# Patient Record
Sex: Male | Born: 1955
Health system: Southern US, Community
[De-identification: ages and names within clinical notes are randomized; demographics above are authoritative.]

## PROBLEM LIST (undated history)

## (undated) DIAGNOSIS — I2510 Atherosclerotic heart disease of native coronary artery without angina pectoris: Secondary | ICD-10-CM

## (undated) DIAGNOSIS — E7800 Pure hypercholesterolemia, unspecified: Secondary | ICD-10-CM

## (undated) DIAGNOSIS — I219 Acute myocardial infarction, unspecified: Secondary | ICD-10-CM

## (undated) DIAGNOSIS — E119 Type 2 diabetes mellitus without complications: Secondary | ICD-10-CM

## (undated) DIAGNOSIS — M1990 Unspecified osteoarthritis, unspecified site: Secondary | ICD-10-CM

## (undated) DIAGNOSIS — D649 Anemia, unspecified: Secondary | ICD-10-CM

## (undated) DIAGNOSIS — I10 Essential (primary) hypertension: Secondary | ICD-10-CM

## (undated) HISTORY — PX: CARDIAC CATHETERIZATION: SHX172

## (undated) HISTORY — PX: KNEE ARTHROSCOPY: SUR90

## (undated) HISTORY — PX: APPENDECTOMY: SHX54

## (undated) HISTORY — PX: TONSILLECTOMY: SUR1361

## (undated) HISTORY — PX: FRACTURE SURGERY: SHX138

## (undated) HISTORY — PX: COLONOSCOPY: SHX174

---

## 2003-08-06 ENCOUNTER — Ambulatory Visit (HOSPITAL_COMMUNITY): Admission: RE | Admit: 2003-08-06 | Discharge: 2003-08-06 | Payer: Self-pay | Admitting: Orthopaedic Surgery

## 2003-09-16 ENCOUNTER — Ambulatory Visit (HOSPITAL_COMMUNITY): Admission: RE | Admit: 2003-09-16 | Discharge: 2003-09-16 | Payer: Self-pay | Admitting: Orthopaedic Surgery

## 2006-11-18 ENCOUNTER — Ambulatory Visit (HOSPITAL_COMMUNITY): Admission: RE | Admit: 2006-11-18 | Discharge: 2006-11-18 | Payer: Self-pay | Admitting: Orthopaedic Surgery

## 2006-12-06 ENCOUNTER — Encounter (INDEPENDENT_AMBULATORY_CARE_PROVIDER_SITE_OTHER): Payer: Self-pay | Admitting: Specialist

## 2006-12-06 ENCOUNTER — Ambulatory Visit (HOSPITAL_COMMUNITY): Admission: RE | Admit: 2006-12-06 | Discharge: 2006-12-06 | Payer: Self-pay | Admitting: Orthopaedic Surgery

## 2007-02-09 ENCOUNTER — Encounter (HOSPITAL_COMMUNITY): Admission: RE | Admit: 2007-02-09 | Discharge: 2007-03-11 | Payer: Self-pay | Admitting: Orthopedic Surgery

## 2007-03-13 ENCOUNTER — Encounter (HOSPITAL_COMMUNITY): Admission: RE | Admit: 2007-03-13 | Discharge: 2007-04-12 | Payer: Self-pay | Admitting: Orthopedic Surgery

## 2007-04-13 ENCOUNTER — Encounter (HOSPITAL_COMMUNITY): Admission: RE | Admit: 2007-04-13 | Discharge: 2007-05-13 | Payer: Self-pay | Admitting: Orthopedic Surgery

## 2007-05-15 ENCOUNTER — Ambulatory Visit (HOSPITAL_COMMUNITY): Admission: RE | Admit: 2007-05-15 | Discharge: 2007-05-15 | Payer: Self-pay | Admitting: Pulmonary Disease

## 2007-05-16 ENCOUNTER — Encounter (HOSPITAL_COMMUNITY): Admission: RE | Admit: 2007-05-16 | Discharge: 2007-06-15 | Payer: Self-pay | Admitting: Orthopedic Surgery

## 2007-08-16 ENCOUNTER — Ambulatory Visit (HOSPITAL_COMMUNITY): Admission: RE | Admit: 2007-08-16 | Discharge: 2007-08-16 | Payer: Self-pay | Admitting: Internal Medicine

## 2007-08-16 ENCOUNTER — Encounter: Payer: Self-pay | Admitting: Internal Medicine

## 2007-08-16 ENCOUNTER — Ambulatory Visit: Payer: Self-pay | Admitting: Internal Medicine

## 2007-09-28 DIAGNOSIS — I219 Acute myocardial infarction, unspecified: Secondary | ICD-10-CM

## 2007-09-28 HISTORY — PX: CORONARY STENT PLACEMENT: SHX1402

## 2007-09-28 HISTORY — DX: Acute myocardial infarction, unspecified: I21.9

## 2008-07-19 ENCOUNTER — Encounter: Payer: Self-pay | Admitting: Emergency Medicine

## 2008-07-19 ENCOUNTER — Inpatient Hospital Stay (HOSPITAL_COMMUNITY): Admission: EM | Admit: 2008-07-19 | Discharge: 2008-07-23 | Payer: Self-pay | Admitting: Cardiovascular Disease

## 2008-10-07 ENCOUNTER — Encounter (HOSPITAL_COMMUNITY): Admission: RE | Admit: 2008-10-07 | Discharge: 2008-11-06 | Payer: Self-pay | Admitting: Cardiovascular Disease

## 2008-11-08 ENCOUNTER — Encounter (HOSPITAL_COMMUNITY): Admission: RE | Admit: 2008-11-08 | Discharge: 2008-12-08 | Payer: Self-pay | Admitting: Cardiovascular Disease

## 2008-12-09 ENCOUNTER — Encounter (HOSPITAL_COMMUNITY): Admission: RE | Admit: 2008-12-09 | Discharge: 2009-01-08 | Payer: Self-pay | Admitting: Cardiovascular Disease

## 2009-03-24 ENCOUNTER — Emergency Department (HOSPITAL_COMMUNITY): Admission: EM | Admit: 2009-03-24 | Discharge: 2009-03-24 | Payer: Self-pay | Admitting: Emergency Medicine

## 2009-03-25 ENCOUNTER — Ambulatory Visit (HOSPITAL_COMMUNITY): Admission: RE | Admit: 2009-03-25 | Discharge: 2009-03-25 | Payer: Self-pay | Admitting: Pulmonary Disease

## 2010-07-30 ENCOUNTER — Ambulatory Visit (HOSPITAL_COMMUNITY): Admission: RE | Admit: 2010-07-30 | Discharge: 2010-07-30 | Payer: Self-pay | Admitting: Pulmonary Disease

## 2010-12-23 ENCOUNTER — Other Ambulatory Visit (HOSPITAL_COMMUNITY): Payer: Self-pay | Admitting: Orthopaedic Surgery

## 2010-12-23 DIAGNOSIS — M25569 Pain in unspecified knee: Secondary | ICD-10-CM

## 2010-12-24 ENCOUNTER — Ambulatory Visit (HOSPITAL_COMMUNITY)
Admission: RE | Admit: 2010-12-24 | Discharge: 2010-12-24 | Disposition: A | Discharge status: Home / Self Care | Payer: Federal, State, Local not specified - PPO | Source: Ambulatory Visit | Attending: Orthopaedic Surgery | Admitting: Orthopaedic Surgery

## 2010-12-24 ENCOUNTER — Other Ambulatory Visit (HOSPITAL_COMMUNITY): Payer: Self-pay | Admitting: Orthopaedic Surgery

## 2010-12-24 DIAGNOSIS — M25569 Pain in unspecified knee: Secondary | ICD-10-CM | POA: Insufficient documentation

## 2010-12-24 DIAGNOSIS — M25469 Effusion, unspecified knee: Secondary | ICD-10-CM | POA: Insufficient documentation

## 2010-12-24 DIAGNOSIS — M2340 Loose body in knee, unspecified knee: Secondary | ICD-10-CM | POA: Insufficient documentation

## 2010-12-24 DIAGNOSIS — S83259A Bucket-handle tear of lateral meniscus, current injury, unspecified knee, initial encounter: Secondary | ICD-10-CM | POA: Insufficient documentation

## 2011-01-04 LAB — DIFFERENTIAL
Basophils Absolute: 0 10*3/uL (ref 0.0–0.1)
Basophils Relative: 0 % (ref 0–1)
Eosinophils Absolute: 0 10*3/uL (ref 0.0–0.7)
Eosinophils Relative: 0 % (ref 0–5)
Lymphocytes Relative: 6 % — ABNORMAL LOW (ref 12–46)
Lymphs Abs: 0.7 10*3/uL (ref 0.7–4.0)
Monocytes Absolute: 0.8 10*3/uL (ref 0.1–1.0)
Monocytes Relative: 7 % (ref 3–12)
Neutro Abs: 10.3 10*3/uL — ABNORMAL HIGH (ref 1.7–7.7)
Neutrophils Relative %: 87 % — ABNORMAL HIGH (ref 43–77)

## 2011-01-04 LAB — BASIC METABOLIC PANEL
BUN: 25 mg/dL — ABNORMAL HIGH (ref 6–23)
CO2: 27 mEq/L (ref 19–32)
Calcium: 9.2 mg/dL (ref 8.4–10.5)
Chloride: 106 mEq/L (ref 96–112)
Creatinine, Ser: 1.4 mg/dL (ref 0.4–1.5)
GFR calc Af Amer: >60 mL/min (ref >60)
GFR calc non Af Amer: 53 mL/min — ABNORMAL LOW (ref >60)
Glucose, Bld: 177 mg/dL — ABNORMAL HIGH (ref 70–99)
Potassium: 4 mEq/L (ref 3.5–5.1)
Sodium: 138 mEq/L (ref 135–145)

## 2011-01-04 LAB — CBC
HCT: 36.3 % — ABNORMAL LOW (ref 39.0–52.0)
Hemoglobin: 12.9 g/dL — ABNORMAL LOW (ref 13.0–17.0)
MCHC: 35.6 g/dL (ref 30.0–36.0)
MCV: 89.5 fL (ref 78.0–100.0)
Platelets: 174 10*3/uL (ref 150–400)
RBC: 4.06 MIL/uL — ABNORMAL LOW (ref 4.22–5.81)
RDW: 13.7 % (ref 11.5–15.5)
WBC: 11.8 10*3/uL — ABNORMAL HIGH (ref 4.0–10.5)

## 2011-01-04 LAB — GLUCOSE, CAPILLARY: Glucose-Capillary: 151 mg/dL — ABNORMAL HIGH (ref 70–99)

## 2011-02-09 NOTE — H&P (Signed)
NAMEDEAUNTE, DENTE NO.:  [id number]   MEDICAL RECORD NO.:  [id number]          PATIENT TYPE:  INP   LOCATION:  2909                         FACILITY:  [facility]   PHYSICIAN:  Nanetta Batty, M.D.   DATE OF BIRTH:  1956/07/26   DATE OF ADMISSION:  07/19/2008  DATE OF DISCHARGE:                              HISTORY & PHYSICAL   CHIEF COMPLAINT:  Chest pain.   HISTORY OF PRESENT ILLNESS:  Mr. Klimowicz is a 55 year old male with no  prior history of coronary disease who is an insulin-dependent diabetic  followed by Dr. Juanetta Gosling.  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  This only lasted about 15  minutes.  He went inside and laid down and took a nap, but when he woke  up, it recurred.  He mentioned it to his wife, and he went to [facility]  ER.  His symptoms became worse in the emergency room, and his EKG  suggested acute circumflex MI.  He was treated with heparin, aspirin,  and transported to [facility] for urgent catheterization.  The patient  describes his pain as a midsternal pressure.  He did have some radiation  to his left arm.  By the time he came to the emergency room at [facility], he did have some associated nausea and vomiting.  The patient's  past medical history is remarkable for prior left knee ACL replacement a  year and a half ago.  He has had an appendectomy and tonsillectomy.  He  has insulin-dependent diabetes.   His current medications are:  1. Humalog 75/25; he takes 110 units in the morning and a variable      dose in the evening.  2. He takes Tricor daily.  3. He also takes an anti-inflammatory.   He has no known drug allergies.   SOCIAL HISTORY:  He is married.  He is currently out of work from [employer]  in [place].  He is a nonsmoker.   FAMILY HISTORY:  Remarkable that his mother has coronary disease; she  still living.   REVIEW OF SYSTEMS:  Essentially unremarkable except for above.  He  denies  any GI bleeding or melena.  He has not had previous chest pain or  unusual dyspnea.   PHYSICAL EXAMINATION:  Blood pressure 128/76, pulse 54, respirations 12.  GENERAL:  He is well-developed, overweight male in no acute distress.  HEENT:  Normocephalic.  Extraocular movements are intact.  Sclerae  nonicteric.  He wears glasses.  NECK:  Without JVD or bruit.  CHEST:  Clear to auscultation and percussion.  CARDIAC EXAM:  Reveals regular rate and rhythm without murmur or gallop.  Normal S1-S2.  ABDOMEN:  Is obese, nontender.  No hepatosplenomegaly.  EXTREMITIES:  Without edema.  Distal pulses are 3+/4 bilaterally.  NEUROLOGIC EXAM:  Grossly intact.  He is awake, alert, oriented, and  cooperative.  Moves all extremities without obvious deficit.  SKIN:  Warm and dry   IMPRESSION:  1. Acute myocardial infarction.  2. Insulin-dependent diabetes.  3. Dyslipidemia.  4. Obesity.   PLAN:  The patient was taken to the catheterization lab urgently by Dr.  Allyson Sabal for catheterization.      Abelino Derrick, P.A.      Nanetta Batty, M.D.  Electronically Signed    LKK/MEDQ  D:  07/19/2008  T:  07/20/2008  Job:  [id number]

## 2011-02-09 NOTE — Cardiovascular Report (Signed)
NAMEISA, Raymond [name] NO.:  [id number]   MEDICAL RECORD NO.:  [id number]          PATIENT TYPE:  INP   LOCATION:  2909                         FACILITY:  [facility]   PHYSICIAN:  Raymond [name], M.D.   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  Took four  baby aspirin at home and was driven to [facility] ER where he was found  to have inferior ST-segment elevation.  He was treated with  nitroglycerin, IV heparin, and transferred by [facility] to [facility] for  urgent intervention.  His risk factors include family history and 61-  year history of insulin-dependent diabetes.   DESCRIPTION OF PROCEDURE:  The patient was brought to the second floor  [facility] in the postabsorptive state.  He was not  premedicated.  His right groin was prepped and shaved in the usual  sterile fashion.  Xylocaine 1% was used for local anesthesia.  A 6-  French sheath was inserted into the right femoral artery using standard  Seldinger technique.  A 6-French right and left Judkins diagnostic  catheter as well as a 6-French pigtail catheter were used for selective  coronary angiography, left ventriculography, subselective left internal  mammary artery angiography, and distal abdominal aortography.  Visipaque  dye was used for the entirety of the case.  Retrograde aorta, left  ventricular, and pullback pressures were recorded.   HEMODYNAMIC RESULTS:  1. Aortic systolic pressure 144 and diastolic pressure 74.  2. Left ventricular systolic pressure 141 and diastolic pressure 25.   SELECTIVE CORONARY ANGIOGRAPHY:  1. Left main normal.  2. LAD; LAD was calcified in its midportion with 60% segmental  mid      stenosis as well as 60% proximal segmental stenosis of the diagonal      branch arising from the diseased LAD segment.  3. Ramus branch; this is a large bifurcating vessel that was free of      significant disease.  4. Left circumflex; this was a nondominant vessel with a 99% AV groove      stenosis that was the culprit lesion.  5. Right coronary artery; this is a large dominant vessel with an 80%      stenosis at the crux of the vessel just before the bifurcation of      PDA and PLA.  6. Left ventriculography; RAO and LAO left ventriculograms were      performed using 25 mL of Visipaque dye at 12 mL per second.      Overall LVEF was estimated greater than 60% without focal wall      motion abnormalities.  Left ventriculography was performed in the      RAO and LAO views.  7. Left internal mammary artery; this vessel is subselectively  visualized and was widely patent.  Suitable for use during coronary      artery bypass grafting.  8. Distal abdominal aortography; distal abdominal aortogram was      performed using 20 mL of Visipaque dye at 20 mL per second.  The      renal arteries were widely patent.  Infrarenal abdominal aorta and      iliac bifurcation were free of significant atherosclerotic changes.   IMPRESSION:  Raymond [name] has an aborted inferior wall myocardial  infarction.  This is a result of high-grade lesion in the mid  atrioventricular groove circ.  The distal PLA from the circumflex  courses down the inferior wall.  We will plan to perform percutaneous  coronary intervention and stenting using Angiomax and drug-eluting stent  of the circumflex with staged intervention of the right coronary disease  and probably medical treatment of the LAD diagonal branch bifurcation.   The patient received four baby aspirin at home, 600 mg of p.o. Plavix,  20 mg of IV Pepcid, and Angiomax bolus with an AST of 391.  Visipaque  dye was used for the entirety of the case.   Retrograde aortic pressures  monitored during the case.  A 200 mcg of intracoronary nitroglycerin was  administered during the case.   Using a 6-French XB 3.5 guide catheter along with 0.014 x 190 Asahi soft  wire and 2.5 x 12 mm Sprinter balloon PTCA was performed.  Following  this a 2.75 x 15 Promus stent was then deployed at 14 and 16 atmospheres  and postdilated with a 3.0 x 12 [place] Voyager at 16 atmospheres (3.7-mm  resulting in reduction of 99% mid AV groove circumflex 0% residual TIMI  III flow).  The patient tolerated the procedure well.  There were no  hemodynamic or electrocardiographic sequelae.   OVERALL IMPRESSION:  Successful percutaneous coronary intervention of  stenting of mid atrioventricular groove circumflex, which was patent at  the time of intervention with TIMI III flow and the patient was  asymptomatic with an excellent result.  Plans will be to discontinue the  Angiomax and perform intervention in distal right in several days with  medical treatment of the left anterior descending diagonal branch and  outpatient Myoview stress testing to determine physiologic significance.  The patient will be treated with aspirin, Plavix, beta-blocker, statin  drug, and ACE inhibitor.  He left the laboratory in stable condition.      Raymond [name], M.D.  Electronically Signed     JB/MEDQ  D:  07/19/2008  T:  07/20/2008  Job:  [id number]   cc:   [facility]  [facility]  Ramon Dredge L. Juanetta Gosling, M.D.

## 2011-02-09 NOTE — Cardiovascular Report (Signed)
Raymond [name], POUND NO.:  [id number]   MEDICAL RECORD NO.:  [id number]          PATIENT TYPE:  INP   LOCATION:  6524                         FACILITY:  [facility]   PHYSICIAN:  Nanetta Batty, M.D.   DATE OF BIRTH:  1956/06/21   DATE OF PROCEDURE:  DATE OF DISCHARGE:                            CARDIAC CATHETERIZATION   Mr. Solis presents today for PCI and stenting of his distal right  coronary artery.  He presented Friday night with an inferior MI related  to high-grade mid AV groove circumflex lesion, which was stented with a  Promus drug-eluting stent.  He had moderate LAD to diagonal branch  bifurcation disease, which was calcified as well as distal right  disease.  He did well over the weekend and presents now for stage  elective RCA intervention.   PROCEDURE DESCRIPTION:  The patient was brought to the Second Floor  [facility] in the postabsorptive state.  He was  premedicated with p.o. Valium, IV Versed, and fentanyl.  His right groin  was prepped and shaved in the usual sterile fashion.  A 1% Xylocaine was  used for local anesthesia.  A 7-French sheath was inserted into the  right femoral artery using standard Seldinger technique.  A 7-French JR4  guide catheter was used along with  4190 Asahi soft wire and a 2.5 x 10  angioscope atherectomy device.  Visipaque dye was used through the  entirety of the case.  Retrograde aortic pressure was monitored during  the case.  The patient was on aspirin and Plavix and received Angiomax  bolus with an ACT of 276.   The JR4 guide engaged the ostium of the right coronary artery without  difficulty.  Asahi soft wire crossed the lesion and was placed across  the PDA into the distal PLA branch.  Angioscope atherectomy was  performed of the distal RCA at the crux and nominal pressures with an  excellent graft result.  Stenting was performed with a 3.0 x 15 Promus  drug-eluting stent across the ostium of the  PDA into the proximal PLA.  There was snow-ploughing and impingement on the ostium of the PDA.  The PDA was then wired through the strut of the Promus stent and dilated  with a 2.5 x 10 Apex balloon.  A 2.25 x 8 Taxus Adam drug-eluting stent  was then placed at the ostium of the PDA through the Promus stent strut  and deployed at 14 atmospheres.  This was postdilated with a 2.5 x 8 [place]  Voyager at 16 atmospheres resulting in reduction of 90% ostial PDA  lesion to 0% residual.  The patient tolerated the procedure well.  There  were no hemodynamic or electrocardiographic sequelae.  There was TIMI 3  flow at the end of the case.   IMPRESSION:  Successful percutaneous coronary intervention stenting of  the distal right coronary artery and ostium of the posterior descending  artery with a Promus drug-eluting stent and a Taxus Adam drug-eluting  stent.  The patient tolerated the procedure well.  The guidewire and  catheter were removed.  The sheath was then secured in  place.  The patient left the lab in stable condition.  He will be gently  hydrated overnight, treated with aspirin and Plavix, and discharged home  in the morning.  We will treat his LAD diagonal branches disease  medically and get an outpatient Myoview to determine physiologic  significance.      Nanetta Batty, M.D.  Electronically Signed     JB/MEDQ  D:  07/22/2008  T:  07/23/2008  Job:  [id number]   cc:   Second [facility]  [facility]

## 2011-02-09 NOTE — Op Note (Signed)
NAME:  [name], Raymond                  ACCOUNT NO.:  [id number]   MEDICAL RECORD NO.:  [id number]          PATIENT TYPE:  AMB   LOCATION:  DAY                           FACILITY:  [facility]   PHYSICIAN:  R. Roetta Sessions, M.D. DATE OF BIRTH:  [birth year]   DATE OF PROCEDURE:  08/16/2007  DATE OF DISCHARGE:                               OPERATIVE REPORT   PROCEDURE:  Colonoscopy with snare polypectomy and biopsy.   INDICATIONS FOR PROCEDURE:  A 55 year old gentleman devoid of any lower  GI tract symptoms, sent over courtesy of Dr. Shaune Pollack for colorectal  cancer screening. He has never had his lower GI tract imaged, no family  history colon cancer.  Risks, benefits and alternatives have been  reviewed, questions were answered.  He is agreeable.   PROCEDURE NOTE:  O2 saturation, blood pressure, pulse and respirations  were monitored throughout the entire procedure.   CONSCIOUS SEDATION:  Versed 4 mg IV, Demerol 100 grams IV in divided  doses.   INSTRUMENT:  Pentax video chip system.   FINDINGS:  Digital rectal exam revealed no obvious abnormalities.   ENDOSCOPIC FINDINGS:  Prep was good.  Colon:  Colonic mucosa was surveyed from rectosigmoid junction through  the left, transverse, right colon to the area of appendiceal orifice,  ileocecal valve and cecum.  These structures were well seen and  photographed for the record.  From this level scope was slowly and  cautiously withdrawn.  All previously mentioned mucosal surfaces were  again seen.  The colonic mucosa appeared entirely normal down to the  rectosigmoid  In the rectosigmoid there was an angry 7 mm polyp on broad  stalk.  The remainder of the colonic mucosa appeared normal.  This polyp  was resected with hot snare, recovered through the scope.  The scope was  pulled down in the rectum.  Thorough examination of rectal mucosa  including retroflex view of anal verge demonstrated a pale oval lesion  coming out of the anal canal seen  best retroflexed.  This had somewhat  of the appearance of an anal papilla but it appeared to be slightly  larger and more round than is typically seen.  The surface of it looked  more like a hyperplastic polyp.  Please see multiple photographs taken.  This was cold biopsied multiple times.  Some good pieces were obtained  for the pathologist.  The patient tolerated the procedure well, was  reacted in endoscopy.   IMPRESSION:  Pale oval lesion just inside the anal verge perhaps  emanating from the anal canal as described above, seen retroflexed, not  well appreciated on digital rectal exam, suspicious for unusual anal  papilla versus hyperplastic polyp versus other process, biopsied.  Otherwise normal rectum.  At rectosigmoid, 7-mm angry polyp removed with  hot snare.  Remainder colonic mucosa appeared normal.   RECOMMENDATIONS:  1. Follow-up on path.  2. Further recommendations to follow.      Jonathon Bellows, M.D.  Electronically Signed     RMR/MEDQ  D:  08/16/2007  T:  08/16/2007  Job:  [id number]  cc:   Ramon Dredge L. Juanetta Gosling, M.D.  Fax: [phone]

## 2011-02-09 NOTE — Discharge Summary (Signed)
Raymond [name], Raymond [name] NO.:  [id number]   MEDICAL RECORD NO.:  [id number]          PATIENT TYPE:  INP   LOCATION:  6524                         FACILITY:  [facility]   PHYSICIAN:  Nanetta Batty, M.D.   DATE OF BIRTH:  May 21, 1956   DATE OF ADMISSION:  07/19/2008  DATE OF DISCHARGE:  07/23/2008                               DISCHARGE SUMMARY   DISCHARGE DIAGNOSES:  1. The circumflex myocardial infarction treated with urgent      intervention and placement of a Promus stent on July 19, 2008,      with staged intervention to the right coronary artery and posterior      descending artery with a Promus and a Taxus stent on July 22, 2008.  2. Insulin-dependent diabetes.  3. Dyslipidemia.   HOSPITAL COURSE:  Raymond [name] is a 55 year old male with no prior history  of coronary artery disease.  He is an insulin-dependent diabetic  followed by Dr. Juanetta [name].  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  He went inside  and laid down.  His symptoms initially abated, but then recurred.  He  presented to [facility] ER.  His EKG there suggested acute circumflex MI  and he was transferred as a ST-elevation MI and transferred urgently to  [facility] after receiving aspirin and heparin and nitroglycerin.  The patient  was taken immediately to the Cath Lab by Dr. Allyson Sabal.  Left main was  normal.  He did have a 50-60% LAD lesion.  Circumflex had a 95% mid  stenosis.  There was a large OM that was free of disease.  The RCA had a  30% proximal narrowing and an 80% distal narrowing.  The iliac arteries  and renal arteries were normal.  LV function was normal.  The patient  underwent placement of a Promus stent to the infarct vessel.  He was  admitted to the CCU.  Serial enzymes were obtained.  His CKs peaked at  357 with 12 MBs.  He was taken back to the Cath Lab on July 22, 2008,  for elective intervention of the RCA.  He had  an RCA Taxus stent with a  Promus stent placed to the PDA.  Plan is for medical therapy of residual  60% LAD narrowing.  We feel he can be discharged on July 23, 2008.   DISCHARGE MEDICATIONS:  Novolin insulin 75/25, he takes 110 units in the  morning and 90 units in the evening and we have added ramipril 5 mg a  day, simvastatin 40 mg a day, Plavix 75 mg a day, coated aspirin 325 mg  a day, Pepcid AC 20 mg a day, and nitroglycerin sublingual p.r.n.   LABORATORY DATA:  CKs peaked as noted above 357 and 12 MBs.  Lipid panel  shows cholesterol 155, triglycerides 199, HDL was 19, and LDL was 95.  White count is 8.1, hemoglobin 14, hematocrit 39.5, and platelets 178.  Sodium 138, potassium 3.9, BUN 18, and creatinine 1.16.  Hemoglobin A1c  was 6.8.  TSH is 1.76.  Portable chest, there was cardiac enlargement  with pulmonary venous congestion.   DISPOSITION:  The patient is discharged in stable condition.  He will  follow up with Dr. Allyson Sabal in [place].  He will need followup LFTs.  We  may consider adding niacin at some point as an outpatient.      Raymond [name], P.A.      Nanetta Batty, M.D.  Electronically Signed    LKK/MEDQ  D:  07/23/2008  T:  07/23/2008  Job:  [id number]   cc:   Nanetta Batty, M.D.  Raymond [name], M.D.

## 2011-02-12 NOTE — Op Note (Signed)
NAME:  Raymond [name], Raymond [name]                  ACCOUNT NO.:  [id number]   MEDICAL RECORD NO.:  [id number]          PATIENT TYPE:  AMB   LOCATION:  DAY                           FACILITY:  [facility]   PHYSICIAN:  J. Darreld Mclean, M.D. DATE OF BIRTH:  10/23/1955   DATE OF PROCEDURE:  12/06/2006  DATE OF DISCHARGE:                               OPERATIVE REPORT   PREOPERATIVE DIAGNOSIS:  Tear left knee medial and lateral meniscus, old  anterior cruciate ligament tear, posterior medial meniscus plus old  anterior cruciate ligament tear of the left knee.   PROCEDURE:  Operative arthroscopy of the left knee, debridement of ACL  tear, partial medial meniscectomy on the left.   SURGEON:  J. Darreld Mclean, M.D.   ANESTHESIA:  General.   TOURNIQUET TIME:  Twenty-three minutes.   DRAINS:  None.   INDICATIONS FOR PROCEDURE:  The patient fell and injured his left knee.  MRI shows a tear of the left knee medial and lateral meniscus and the  radiologist read an acute tear of ACL, but by his history and by  previous problems he has an old ACL tear of the left knee.  He has not  gotten improvement with conservative treatment.  Surgery is recommended.  The risks and imponderable of the procedure were discussed  preoperatively.  The patient appeared to understand and agreed to the  procedure as outlined.   DESCRIPTION OF PROCEDURE:  The patient was seen in the holding area.  The left knee was identified as the correct surgical site.  Placed a  mark on the left knee, placed a mark on the left knee.  He was brought  back to the operating room and given general anesthesia while supine.  Tourniquet __________ placed deflated, left knee.  A time-out was held  and identified Raymond [name] as the patient.  The left knee was the correct  surgical site.  He was prepped and draped in the usual manner.  The leg  was elevated, wrapped circumferentially with an Esmarch bandage and  tourniquet inflated to 300 mmHg.  Esmarch  bandage removed.   A medial incision was made and lactated ringers were instilled in the  knee by an infusion pump.  Laterally an incision was made and the  arthroscope was inserted.  Appropriate pictures were taken throughout  the procedure.  The suprapatellar pouch had degenerative changes and  grade II-III changes of the femoral patella condyle.  The medial side  was examined.  There was a small tear in the posterior horn of the  medial meniscus and this was removed with a meniscal shaver.  A good  smooth contour was obtained.  He had an ACL tear which appeared to be  old and chronic.  This area was debrided with a meniscal shaver.  Grade  II-III changes on the tibial plateau and the femoral condyle.  The  femoral condyle was irregular.   Laterally although it was difficult to see the meniscus was intact and  there was no apparent tear or any grade II changes laterally.  He was  systematically  re-examined and no pathology found.  The wounds were  irrigated with __________ part lactated ringers.  The wounds were  reapproximated using 3-0 nylon in interrupted vertical mattress manner.  Marcaine 0.25% instilled in each portal.  Tourniquet deflated for 23  minutes.  The patient tolerated the procedure well.  He was given a  prescription for Vicodin ES for pain.  I will see him in the office in  approximately 10 days to two weeks.  Physical therapy has been set up.  If any difficulties call me.  Numbers have been provided.           ______________________________  Shela Commons. Darreld Mclean, M.D.     JWK/MEDQ  D:  12/06/2006  T:  12/06/2006  Job:  [id number]
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
## 2011-06-28 LAB — DIFFERENTIAL
Basophils Absolute: 0
Basophils Relative: 0
Eosinophils Absolute: 0.1
Eosinophils Relative: 2
Lymphocytes Relative: 33
Lymphs Abs: 2.3
Monocytes Absolute: 0.8
Monocytes Relative: 11
Neutro Abs: 3.8
Neutrophils Relative %: 54

## 2011-06-28 LAB — BASIC METABOLIC PANEL
BUN: 26 — ABNORMAL HIGH
CO2: 25
Calcium: 9.1
Chloride: 108
Creatinine, Ser: 1.37
GFR calc Af Amer: >60
GFR calc non Af Amer: 54 — ABNORMAL LOW
Glucose, Bld: 135 — ABNORMAL HIGH
Potassium: 4.2
Sodium: 139

## 2011-06-28 LAB — CBC
HCT: 38.4 — ABNORMAL LOW
Hemoglobin: 13.2
MCHC: 34.4
MCV: 88.5
Platelets: 171
RBC: 4.34
RDW: 13.1
WBC: 7

## 2011-06-28 LAB — POCT CARDIAC MARKERS
CKMB, poc: 6.3
Myoglobin, poc: 316
Troponin i, poc: <0.05

## 2011-06-29 LAB — BASIC METABOLIC PANEL
BUN: 19
BUN: 21
CO2: 25
Calcium: 8.7
Calcium: 8.8
Chloride: 106
Creatinine, Ser: 1.11
GFR calc Af Amer: >60
GFR calc Af Amer: >60
GFR calc Af Amer: >60
GFR calc non Af Amer: >60
GFR calc non Af Amer: >60
GFR calc non Af Amer: >60
Glucose, Bld: 95
Potassium: 3.9
Potassium: 3.9
Sodium: 138
Sodium: 140

## 2011-06-29 LAB — COMPREHENSIVE METABOLIC PANEL
ALT: 27
AST: 32
Albumin: 3.5
Alkaline Phosphatase: 72
BUN: 22
CO2: 23
Calcium: 8.4
Chloride: 107
Creatinine, Ser: 1.09
GFR calc Af Amer: >60
GFR calc non Af Amer: >60
Glucose, Bld: 107 — ABNORMAL HIGH
Potassium: 4.2
Sodium: 137
Total Bilirubin: 0.7
Total Protein: 5.5 — ABNORMAL LOW

## 2011-06-29 LAB — CK TOTAL AND CKMB (NOT AT ARMC)
CK, MB: 11.6 — ABNORMAL HIGH
Relative Index: 2.7 — ABNORMAL HIGH
Relative Index: 3.4 — ABNORMAL HIGH
Total CK: 357 — ABNORMAL HIGH
Total CK: 424 — ABNORMAL HIGH

## 2011-06-29 LAB — CBC
HCT: 34.6 — ABNORMAL LOW
HCT: 36.1 — ABNORMAL LOW
Hemoglobin: 12.4 — ABNORMAL LOW
Hemoglobin: 13.2
MCHC: 34.3
MCHC: 35.3
MCV: 89
MCV: 89.3
Platelets: 150
Platelets: 162
Platelets: 170
Platelets: 178
RBC: 4.05 — ABNORMAL LOW
RBC: 4.32
RBC: 4.53
RDW: 12.9
RDW: 13.1
RDW: 13.1
RDW: 13.2
WBC: 6.5
WBC: 8.1

## 2011-06-29 LAB — GLUCOSE, CAPILLARY
Glucose-Capillary: 160 — ABNORMAL HIGH
Glucose-Capillary: 224 — ABNORMAL HIGH
Glucose-Capillary: 227 — ABNORMAL HIGH
Glucose-Capillary: 229 — ABNORMAL HIGH
Glucose-Capillary: 251 — ABNORMAL HIGH
Glucose-Capillary: 257 — ABNORMAL HIGH

## 2011-06-29 LAB — HEMOGLOBIN A1C
Hgb A1c MFr Bld: 6.8 — ABNORMAL HIGH
Mean Plasma Glucose: 148

## 2011-06-29 LAB — DIFFERENTIAL
Basophils Absolute: 0
Basophils Absolute: 0
Basophils Relative: 0
Eosinophils Absolute: 0.1
Eosinophils Relative: 1
Lymphocytes Relative: 23
Lymphocytes Relative: 26
Lymphs Abs: 1.5
Monocytes Absolute: 0.5
Monocytes Absolute: 0.6
Monocytes Relative: 8
Monocytes Relative: 9
Neutro Abs: 4.1
Neutro Abs: 4.5
Neutrophils Relative %: 69

## 2011-06-29 LAB — LIPID PANEL
LDL Cholesterol: 96
VLDL: 40

## 2011-06-29 LAB — PROTIME-INR
INR: 1.3
Prothrombin Time: 16.3 — ABNORMAL HIGH

## 2011-06-29 LAB — CARDIAC PANEL(CRET KIN+CKTOT+MB+TROPI)
CK, MB: 2.8
Relative Index: 2.6 — ABNORMAL HIGH
Total CK: 106
Troponin I: 0.4 — ABNORMAL HIGH

## 2011-06-29 LAB — APTT: aPTT: 46 — ABNORMAL HIGH

## 2011-06-29 LAB — TROPONIN I: Troponin I: 2

## 2011-10-15 DIAGNOSIS — E789 Disorder of lipoprotein metabolism, unspecified: Secondary | ICD-10-CM | POA: Diagnosis not present

## 2011-10-18 DIAGNOSIS — E119 Type 2 diabetes mellitus without complications: Secondary | ICD-10-CM | POA: Diagnosis not present

## 2011-10-18 DIAGNOSIS — E789 Disorder of lipoprotein metabolism, unspecified: Secondary | ICD-10-CM | POA: Diagnosis not present

## 2011-11-17 DIAGNOSIS — M1710 Unilateral primary osteoarthritis, unspecified knee: Secondary | ICD-10-CM | POA: Diagnosis not present

## 2011-12-16 DIAGNOSIS — E119 Type 2 diabetes mellitus without complications: Secondary | ICD-10-CM | POA: Diagnosis not present

## 2011-12-16 DIAGNOSIS — E789 Disorder of lipoprotein metabolism, unspecified: Secondary | ICD-10-CM | POA: Diagnosis not present

## 2011-12-27 DIAGNOSIS — E119 Type 2 diabetes mellitus without complications: Secondary | ICD-10-CM | POA: Diagnosis not present

## 2011-12-27 DIAGNOSIS — I10 Essential (primary) hypertension: Secondary | ICD-10-CM | POA: Diagnosis not present

## 2011-12-27 DIAGNOSIS — E789 Disorder of lipoprotein metabolism, unspecified: Secondary | ICD-10-CM | POA: Diagnosis not present

## 2012-02-02 DIAGNOSIS — E782 Mixed hyperlipidemia: Secondary | ICD-10-CM | POA: Diagnosis not present

## 2012-02-02 DIAGNOSIS — I10 Essential (primary) hypertension: Secondary | ICD-10-CM | POA: Diagnosis not present

## 2012-02-02 DIAGNOSIS — I2510 Atherosclerotic heart disease of native coronary artery without angina pectoris: Secondary | ICD-10-CM | POA: Diagnosis not present

## 2012-02-02 DIAGNOSIS — E119 Type 2 diabetes mellitus without complications: Secondary | ICD-10-CM | POA: Diagnosis not present

## 2012-02-09 DIAGNOSIS — M1710 Unilateral primary osteoarthritis, unspecified knee: Secondary | ICD-10-CM | POA: Diagnosis not present

## 2012-03-23 DIAGNOSIS — E1039 Type 1 diabetes mellitus with other diabetic ophthalmic complication: Secondary | ICD-10-CM | POA: Diagnosis not present

## 2012-03-27 DIAGNOSIS — E119 Type 2 diabetes mellitus without complications: Secondary | ICD-10-CM | POA: Diagnosis not present

## 2012-03-27 DIAGNOSIS — E789 Disorder of lipoprotein metabolism, unspecified: Secondary | ICD-10-CM | POA: Diagnosis not present

## 2012-04-03 DIAGNOSIS — E789 Disorder of lipoprotein metabolism, unspecified: Secondary | ICD-10-CM | POA: Diagnosis not present

## 2012-04-03 DIAGNOSIS — E119 Type 2 diabetes mellitus without complications: Secondary | ICD-10-CM | POA: Diagnosis not present

## 2012-04-03 DIAGNOSIS — I10 Essential (primary) hypertension: Secondary | ICD-10-CM | POA: Diagnosis not present

## 2012-06-12 DIAGNOSIS — E785 Hyperlipidemia, unspecified: Secondary | ICD-10-CM | POA: Diagnosis not present

## 2012-06-12 DIAGNOSIS — F411 Generalized anxiety disorder: Secondary | ICD-10-CM | POA: Diagnosis not present

## 2012-06-12 DIAGNOSIS — I259 Chronic ischemic heart disease, unspecified: Secondary | ICD-10-CM | POA: Diagnosis not present

## 2012-06-12 DIAGNOSIS — I10 Essential (primary) hypertension: Secondary | ICD-10-CM | POA: Diagnosis not present

## 2012-06-12 DIAGNOSIS — F329 Major depressive disorder, single episode, unspecified: Secondary | ICD-10-CM | POA: Diagnosis not present

## 2012-06-12 DIAGNOSIS — E109 Type 1 diabetes mellitus without complications: Secondary | ICD-10-CM | POA: Diagnosis not present

## 2012-07-04 ENCOUNTER — Encounter: Payer: Self-pay | Admitting: Internal Medicine

## 2012-07-04 DIAGNOSIS — E119 Type 2 diabetes mellitus without complications: Secondary | ICD-10-CM | POA: Diagnosis not present

## 2012-07-04 DIAGNOSIS — I10 Essential (primary) hypertension: Secondary | ICD-10-CM | POA: Diagnosis not present

## 2012-07-04 DIAGNOSIS — E789 Disorder of lipoprotein metabolism, unspecified: Secondary | ICD-10-CM | POA: Diagnosis not present

## 2012-07-04 DIAGNOSIS — I2510 Atherosclerotic heart disease of native coronary artery without angina pectoris: Secondary | ICD-10-CM | POA: Diagnosis not present

## 2012-10-04 DIAGNOSIS — E119 Type 2 diabetes mellitus without complications: Secondary | ICD-10-CM | POA: Diagnosis not present

## 2012-10-04 DIAGNOSIS — E789 Disorder of lipoprotein metabolism, unspecified: Secondary | ICD-10-CM | POA: Diagnosis not present

## 2012-10-04 DIAGNOSIS — Z125 Encounter for screening for malignant neoplasm of prostate: Secondary | ICD-10-CM | POA: Diagnosis not present

## 2012-10-04 DIAGNOSIS — I10 Essential (primary) hypertension: Secondary | ICD-10-CM | POA: Diagnosis not present

## 2012-10-11 DIAGNOSIS — E789 Disorder of lipoprotein metabolism, unspecified: Secondary | ICD-10-CM | POA: Diagnosis not present

## 2012-10-11 DIAGNOSIS — I10 Essential (primary) hypertension: Secondary | ICD-10-CM | POA: Diagnosis not present

## 2012-10-11 DIAGNOSIS — I2510 Atherosclerotic heart disease of native coronary artery without angina pectoris: Secondary | ICD-10-CM | POA: Diagnosis not present

## 2012-10-11 DIAGNOSIS — E119 Type 2 diabetes mellitus without complications: Secondary | ICD-10-CM | POA: Diagnosis not present

## 2012-11-07 DIAGNOSIS — E789 Disorder of lipoprotein metabolism, unspecified: Secondary | ICD-10-CM | POA: Diagnosis not present

## 2012-11-14 DIAGNOSIS — E119 Type 2 diabetes mellitus without complications: Secondary | ICD-10-CM | POA: Diagnosis not present

## 2012-11-14 DIAGNOSIS — I2510 Atherosclerotic heart disease of native coronary artery without angina pectoris: Secondary | ICD-10-CM | POA: Diagnosis not present

## 2012-11-14 DIAGNOSIS — E789 Disorder of lipoprotein metabolism, unspecified: Secondary | ICD-10-CM | POA: Diagnosis not present

## 2013-02-01 DIAGNOSIS — I10 Essential (primary) hypertension: Secondary | ICD-10-CM | POA: Diagnosis not present

## 2013-02-01 DIAGNOSIS — E119 Type 2 diabetes mellitus without complications: Secondary | ICD-10-CM | POA: Diagnosis not present

## 2013-02-01 DIAGNOSIS — E782 Mixed hyperlipidemia: Secondary | ICD-10-CM | POA: Diagnosis not present

## 2013-02-01 DIAGNOSIS — I2510 Atherosclerotic heart disease of native coronary artery without angina pectoris: Secondary | ICD-10-CM | POA: Diagnosis not present

## 2013-02-05 DIAGNOSIS — E789 Disorder of lipoprotein metabolism, unspecified: Secondary | ICD-10-CM | POA: Diagnosis not present

## 2013-02-05 DIAGNOSIS — E119 Type 2 diabetes mellitus without complications: Secondary | ICD-10-CM | POA: Diagnosis not present

## 2013-02-12 DIAGNOSIS — E789 Disorder of lipoprotein metabolism, unspecified: Secondary | ICD-10-CM | POA: Diagnosis not present

## 2013-02-12 DIAGNOSIS — I2510 Atherosclerotic heart disease of native coronary artery without angina pectoris: Secondary | ICD-10-CM | POA: Diagnosis not present

## 2013-02-12 DIAGNOSIS — E119 Type 2 diabetes mellitus without complications: Secondary | ICD-10-CM | POA: Diagnosis not present

## 2013-02-23 ENCOUNTER — Other Ambulatory Visit: Payer: Self-pay | Admitting: Cardiovascular Disease

## 2013-03-16 DIAGNOSIS — E789 Disorder of lipoprotein metabolism, unspecified: Secondary | ICD-10-CM | POA: Diagnosis not present

## 2013-03-23 DIAGNOSIS — E789 Disorder of lipoprotein metabolism, unspecified: Secondary | ICD-10-CM | POA: Diagnosis not present

## 2013-03-23 DIAGNOSIS — E119 Type 2 diabetes mellitus without complications: Secondary | ICD-10-CM | POA: Diagnosis not present

## 2013-03-23 DIAGNOSIS — I10 Essential (primary) hypertension: Secondary | ICD-10-CM | POA: Diagnosis not present

## 2013-05-16 DIAGNOSIS — E119 Type 2 diabetes mellitus without complications: Secondary | ICD-10-CM | POA: Diagnosis not present

## 2013-05-16 DIAGNOSIS — E789 Disorder of lipoprotein metabolism, unspecified: Secondary | ICD-10-CM | POA: Diagnosis not present

## 2013-05-23 DIAGNOSIS — E119 Type 2 diabetes mellitus without complications: Secondary | ICD-10-CM | POA: Diagnosis not present

## 2013-05-23 DIAGNOSIS — I10 Essential (primary) hypertension: Secondary | ICD-10-CM | POA: Diagnosis not present

## 2013-05-23 DIAGNOSIS — I1310 Hypertensive heart and chronic kidney disease without heart failure, with stage 1 through stage 4 chronic kidney disease, or unspecified chronic kidney disease: Secondary | ICD-10-CM | POA: Diagnosis not present

## 2013-05-23 DIAGNOSIS — E789 Disorder of lipoprotein metabolism, unspecified: Secondary | ICD-10-CM | POA: Diagnosis not present

## 2013-09-24 DIAGNOSIS — Z23 Encounter for immunization: Secondary | ICD-10-CM | POA: Diagnosis not present

## 2013-09-24 DIAGNOSIS — J209 Acute bronchitis, unspecified: Secondary | ICD-10-CM | POA: Diagnosis not present

## 2013-09-24 DIAGNOSIS — E109 Type 1 diabetes mellitus without complications: Secondary | ICD-10-CM | POA: Diagnosis not present

## 2013-09-24 DIAGNOSIS — I119 Hypertensive heart disease without heart failure: Secondary | ICD-10-CM | POA: Diagnosis not present

## 2013-10-08 DIAGNOSIS — E119 Type 2 diabetes mellitus without complications: Secondary | ICD-10-CM | POA: Diagnosis not present

## 2013-10-08 DIAGNOSIS — Z125 Encounter for screening for malignant neoplasm of prostate: Secondary | ICD-10-CM | POA: Diagnosis not present

## 2013-10-08 DIAGNOSIS — Z0000 Encounter for general adult medical examination without abnormal findings: Secondary | ICD-10-CM | POA: Diagnosis not present

## 2013-10-08 DIAGNOSIS — E789 Disorder of lipoprotein metabolism, unspecified: Secondary | ICD-10-CM | POA: Diagnosis not present

## 2013-10-15 DIAGNOSIS — I10 Essential (primary) hypertension: Secondary | ICD-10-CM | POA: Diagnosis not present

## 2013-10-15 DIAGNOSIS — E789 Disorder of lipoprotein metabolism, unspecified: Secondary | ICD-10-CM | POA: Diagnosis not present

## 2013-10-15 DIAGNOSIS — E119 Type 2 diabetes mellitus without complications: Secondary | ICD-10-CM | POA: Diagnosis not present

## 2013-11-08 DIAGNOSIS — E119 Type 2 diabetes mellitus without complications: Secondary | ICD-10-CM | POA: Diagnosis not present

## 2013-11-15 DIAGNOSIS — E789 Disorder of lipoprotein metabolism, unspecified: Secondary | ICD-10-CM | POA: Diagnosis not present

## 2013-11-15 DIAGNOSIS — I10 Essential (primary) hypertension: Secondary | ICD-10-CM | POA: Diagnosis not present

## 2013-11-15 DIAGNOSIS — E119 Type 2 diabetes mellitus without complications: Secondary | ICD-10-CM | POA: Diagnosis not present

## 2014-01-30 ENCOUNTER — Telehealth: Payer: Self-pay | Admitting: Cardiovascular Disease

## 2014-01-30 NOTE — Telephone Encounter (Signed)
Okay for Raymond [name] to come off his Plavix for his dental procedure

## 2014-01-30 NOTE — Telephone Encounter (Signed)
Pt not seen in Epic.  Paper chart requested for Dr. Cecil Cobbs, RN.  Message forwarded to Curt Bears, RN to discuss w/ Dr. Gwenlyn Found.

## 2014-01-30 NOTE — Telephone Encounter (Signed)
Pt called stating that he needs to have his tooth removed. He went to the dentist and his tooth has a crack in it and it needs to be taken out. He needs a clearance to come off the Plavix.  JB

## 2014-01-30 NOTE — Telephone Encounter (Signed)
Please advise 

## 2014-01-30 NOTE — Telephone Encounter (Signed)
Letter drafted and signed by Dr Gwenlyn Found.

## 2014-01-31 NOTE — Telephone Encounter (Signed)
Patient notified that he can hold Plavix prior to dental work.

## 2014-01-31 NOTE — Telephone Encounter (Signed)
lmom 

## 2014-02-05 ENCOUNTER — Telehealth: Payer: Self-pay | Admitting: Cardiovascular Disease

## 2014-02-05 DIAGNOSIS — E119 Type 2 diabetes mellitus without complications: Secondary | ICD-10-CM | POA: Diagnosis not present

## 2014-02-05 NOTE — Telephone Encounter (Signed)
Renee said she sent over a fax on 01-30-14 at 10:100 A.M.She still have not received it back. If you do not have this fax,please call.

## 2014-02-05 NOTE — Telephone Encounter (Signed)
RN located fax clearance in Dr Kennon Holter folder waiting  to be signed. RN informed Raymond [name], awaiting for signature. Renee stated patient dental extraction is schedule for 02/12/14. RN placed a note on clearance form

## 2014-02-07 ENCOUNTER — Telehealth: Payer: Self-pay | Admitting: *Deleted

## 2014-02-07 NOTE — Telephone Encounter (Signed)
Letter of clearance was faxed to Dr Know

## 2014-02-12 DIAGNOSIS — E119 Type 2 diabetes mellitus without complications: Secondary | ICD-10-CM | POA: Diagnosis not present

## 2014-02-12 DIAGNOSIS — E789 Disorder of lipoprotein metabolism, unspecified: Secondary | ICD-10-CM | POA: Diagnosis not present

## 2014-05-13 DIAGNOSIS — E119 Type 2 diabetes mellitus without complications: Secondary | ICD-10-CM | POA: Diagnosis not present

## 2014-05-15 DIAGNOSIS — I2510 Atherosclerotic heart disease of native coronary artery without angina pectoris: Secondary | ICD-10-CM | POA: Diagnosis not present

## 2014-05-15 DIAGNOSIS — E119 Type 2 diabetes mellitus without complications: Secondary | ICD-10-CM | POA: Diagnosis not present

## 2014-05-15 DIAGNOSIS — I10 Essential (primary) hypertension: Secondary | ICD-10-CM | POA: Diagnosis not present

## 2014-05-15 DIAGNOSIS — E789 Disorder of lipoprotein metabolism, unspecified: Secondary | ICD-10-CM | POA: Diagnosis not present

## 2014-06-05 DIAGNOSIS — E119 Type 2 diabetes mellitus without complications: Secondary | ICD-10-CM | POA: Diagnosis not present

## 2014-06-12 DIAGNOSIS — I10 Essential (primary) hypertension: Secondary | ICD-10-CM | POA: Diagnosis not present

## 2014-06-12 DIAGNOSIS — E789 Disorder of lipoprotein metabolism, unspecified: Secondary | ICD-10-CM | POA: Diagnosis not present

## 2014-06-12 DIAGNOSIS — E119 Type 2 diabetes mellitus without complications: Secondary | ICD-10-CM | POA: Diagnosis not present

## 2014-07-24 DIAGNOSIS — E118 Type 2 diabetes mellitus with unspecified complications: Secondary | ICD-10-CM | POA: Diagnosis not present

## 2014-07-30 DIAGNOSIS — R030 Elevated blood-pressure reading, without diagnosis of hypertension: Secondary | ICD-10-CM | POA: Diagnosis not present

## 2014-07-30 DIAGNOSIS — E780 Pure hypercholesterolemia: Secondary | ICD-10-CM | POA: Diagnosis not present

## 2014-07-30 DIAGNOSIS — E119 Type 2 diabetes mellitus without complications: Secondary | ICD-10-CM | POA: Diagnosis not present

## 2014-10-10 DIAGNOSIS — E118 Type 2 diabetes mellitus with unspecified complications: Secondary | ICD-10-CM | POA: Diagnosis not present

## 2014-10-10 DIAGNOSIS — I10 Essential (primary) hypertension: Secondary | ICD-10-CM | POA: Diagnosis not present

## 2014-10-10 DIAGNOSIS — E789 Disorder of lipoprotein metabolism, unspecified: Secondary | ICD-10-CM | POA: Diagnosis not present

## 2014-10-10 DIAGNOSIS — Z0000 Encounter for general adult medical examination without abnormal findings: Secondary | ICD-10-CM | POA: Diagnosis not present

## 2014-10-17 DIAGNOSIS — I10 Essential (primary) hypertension: Secondary | ICD-10-CM | POA: Diagnosis not present

## 2014-10-17 DIAGNOSIS — E789 Disorder of lipoprotein metabolism, unspecified: Secondary | ICD-10-CM | POA: Diagnosis not present

## 2014-10-17 DIAGNOSIS — I2510 Atherosclerotic heart disease of native coronary artery without angina pectoris: Secondary | ICD-10-CM | POA: Diagnosis not present

## 2014-10-17 DIAGNOSIS — E118 Type 2 diabetes mellitus with unspecified complications: Secondary | ICD-10-CM | POA: Diagnosis not present

## 2015-04-10 DIAGNOSIS — E789 Disorder of lipoprotein metabolism, unspecified: Secondary | ICD-10-CM | POA: Diagnosis not present

## 2015-04-10 DIAGNOSIS — E118 Type 2 diabetes mellitus with unspecified complications: Secondary | ICD-10-CM | POA: Diagnosis not present

## 2015-04-17 DIAGNOSIS — E789 Disorder of lipoprotein metabolism, unspecified: Secondary | ICD-10-CM | POA: Diagnosis not present

## 2015-04-17 DIAGNOSIS — E118 Type 2 diabetes mellitus with unspecified complications: Secondary | ICD-10-CM | POA: Diagnosis not present

## 2015-04-17 DIAGNOSIS — I10 Essential (primary) hypertension: Secondary | ICD-10-CM | POA: Diagnosis not present

## 2015-04-22 ENCOUNTER — Encounter: Payer: Self-pay | Admitting: Cardiovascular Disease

## 2015-05-22 ENCOUNTER — Emergency Department (HOSPITAL_COMMUNITY): Payer: Federal, State, Local not specified - PPO

## 2015-05-22 ENCOUNTER — Emergency Department (HOSPITAL_COMMUNITY)
Admission: EM | Admit: 2015-05-22 | Discharge: 2015-05-22 | Disposition: A | Discharge status: Home / Self Care | Payer: Federal, State, Local not specified - PPO | Attending: Emergency Medicine | Admitting: Emergency Medicine

## 2015-05-22 ENCOUNTER — Encounter (HOSPITAL_COMMUNITY): Payer: Self-pay | Admitting: Emergency Medicine

## 2015-05-22 DIAGNOSIS — Z9889 Other specified postprocedural states: Secondary | ICD-10-CM | POA: Diagnosis not present

## 2015-05-22 DIAGNOSIS — S61411A Laceration without foreign body of right hand, initial encounter: Secondary | ICD-10-CM

## 2015-05-22 DIAGNOSIS — Y9389 Activity, other specified: Secondary | ICD-10-CM | POA: Insufficient documentation

## 2015-05-22 DIAGNOSIS — W231XXA Caught, crushed, jammed, or pinched between stationary objects, initial encounter: Secondary | ICD-10-CM | POA: Insufficient documentation

## 2015-05-22 DIAGNOSIS — Z7902 Long term (current) use of antithrombotics/antiplatelets: Secondary | ICD-10-CM | POA: Insufficient documentation

## 2015-05-22 DIAGNOSIS — Z955 Presence of coronary angioplasty implant and graft: Secondary | ICD-10-CM | POA: Insufficient documentation

## 2015-05-22 DIAGNOSIS — I252 Old myocardial infarction: Secondary | ICD-10-CM | POA: Insufficient documentation

## 2015-05-22 DIAGNOSIS — S61212A Laceration without foreign body of right middle finger without damage to nail, initial encounter: Secondary | ICD-10-CM | POA: Insufficient documentation

## 2015-05-22 DIAGNOSIS — Y9289 Other specified places as the place of occurrence of the external cause: Secondary | ICD-10-CM | POA: Insufficient documentation

## 2015-05-22 DIAGNOSIS — Z23 Encounter for immunization: Secondary | ICD-10-CM | POA: Diagnosis not present

## 2015-05-22 DIAGNOSIS — Y998 Other external cause status: Secondary | ICD-10-CM | POA: Insufficient documentation

## 2015-05-22 DIAGNOSIS — E119 Type 2 diabetes mellitus without complications: Secondary | ICD-10-CM | POA: Diagnosis not present

## 2015-05-22 HISTORY — DX: Pure hypercholesterolemia, unspecified: E78.00

## 2015-05-22 HISTORY — DX: Type 2 diabetes mellitus without complications: E11.9

## 2015-05-22 HISTORY — DX: Acute myocardial infarction, unspecified: I21.9

## 2015-05-22 MED ORDER — CEPHALEXIN 500 MG PO CAPS
500.0000 mg | ORAL_CAPSULE | Freq: Once | ORAL | Status: AC
  Administered 2015-05-22: 500 mg via ORAL
  Filled 2015-05-22: qty 1

## 2015-05-22 MED ORDER — TETANUS-DIPHTH-ACELL PERTUSSIS 5-2.5-18.5 LF-MCG/0.5 IM SUSP
0.5000 mL | Freq: Once | INTRAMUSCULAR | Status: AC
  Administered 2015-05-22: 0.5 mL via INTRAMUSCULAR
  Filled 2015-05-22: qty 0.5

## 2015-05-22 MED ORDER — LIDOCAINE HCL (PF) 1 % IJ SOLN
INTRAMUSCULAR | Status: AC
  Filled 2015-05-22: qty 5

## 2015-05-22 MED ORDER — CEPHALEXIN 500 MG PO CAPS: 500.0000 mg | ORAL_CAPSULE | Freq: Two times a day (BID) | ORAL | Status: DC

## 2015-05-22 NOTE — ED Notes (Signed)
Pt "smashed" finger while loading tractor.  On blood thinners, unable to get bleeding to stop

## 2015-05-22 NOTE — ED Provider Notes (Signed)
CSN: [id number]   Arrival date & time 05/22/15 2055  History  This chart was scribed for Milton Ferguson, MD by Altamease Oiler, ED Scribe. This patient was seen in room APA12/[facility]12 and the patient's care was started at 10:08 PM.  Chief Complaint  Patient presents with  . Extremity Laceration    right middle finger    HPI Patient is a 59 y.o. male presenting with skin laceration. The history is provided by the patient. No language interpreter was used.  Laceration Location:  Hand Hand laceration location:  R finger Length (cm):  2 Time since incident:  2 hours Injury mechanism: Machine. Pain details:    Quality:  Throbbing   Severity:  Mild   Timing:  Constant   Progression:  Unchanged Foreign body present:  No foreign bodies Relieved by:  Nothing Worsened by:  Nothing tried Ineffective treatments:  None tried Tetanus status:  Out of date  Raymond [name] is a 59 y.o. male on Plavix who presents to the Emergency Department complaining of laceration at the right long finger with onset around 8 PM tonight. The pt was loading a tractor and his finger was smashed. Associated symptoms include throbbing pain in the right long finger that he rates 3/10 in severity. Tetanus is out of date.    Past Medical History  Diagnosis Date  . Diabetes mellitus without complication   . High cholesterol   . MI (myocardial infarction) 2009    Past Surgical History  Procedure Laterality Date  . Cardiac catheterization    . Coronary stent placement  2009  . Knee arthroscopy    . Appendectomy    . Fracture surgery Right     Steel Rod in femur    Family History  Problem Relation Age of Onset  . Diabetes Mother   . Heart attack Mother   . Cancer Father     lung  . Stroke Paternal Grandfather     Social History  Substance Use Topics  . Smoking status: Never Smoker   . Smokeless tobacco: Never Used  . Alcohol Use: No     Review of Systems  Constitutional: Negative for appetite change and  fatigue.  HENT: Negative for congestion, ear discharge and sinus pressure.   Eyes: Negative for discharge.  Respiratory: Negative for cough.   Cardiovascular: Negative for chest pain.  Gastrointestinal: Negative for abdominal pain and diarrhea.  Genitourinary: Negative for frequency and hematuria.  Musculoskeletal: Negative for back pain.       Laceration at right long finger  Skin: Negative for rash.  Neurological: Negative for seizures and headaches.  Psychiatric/Behavioral: Negative for hallucinations.    Home Medications   Prior to Admission medications   Medication Sig Start Date End Date Taking? Authorizing Provider  clopidogrel (PLAVIX) 75 MG tablet TAKE 1 TABLET DAILY 02/23/13   Lorretta Harp, MD    Allergies  Review of patient's allergies indicates not on file.  Triage Vitals: BP 133/44 mmHg  Pulse 57  Temp(Src) 97.9 F (36.6 C) (Oral)  Resp 18  Ht 6\' 1"  (1.854 m)  Wt 195 lb (88.451 kg)  BMI 25.73 kg/m2  SpO2 99%  Physical Exam  Constitutional: He is oriented to person, place, and time. He appears well-developed.  HENT:  Head: Normocephalic.  Eyes: Conjunctivae are normal.  Neck: No tracheal deviation present.  Cardiovascular:  No murmur heard. Musculoskeletal: Normal range of motion.  2 cm laceration over PIP joint of the right middle finger NVI distally  Neurological: He is oriented to person, place, and time.  Skin: Skin is warm.  Psychiatric: He has a normal mood and affect.    ED Course  LACERATION REPAIR Date/Time: 05/22/2015 11:02 PM Performed by: Milton Ferguson Authorized by: Milton Ferguson Comments: 2 cm lac to right middle finger.  Cleaned with betadine.  Lido no epi for numbing and 4,  4-0 nylon sutures used to close laceration     DIAGNOSTIC STUDIES: Oxygen Saturation is 99% on RA, normal by my interpretation.    COORDINATION OF CARE: 10:09 PM Discussed treatment plan which includes right hand XR and Tdap with pt at bedside and pt  agreed to plan.  Labs Reviewed - No data to display  I, Milton Ferguson, MD, personally reviewed and evaluated these images and lab results as part of my medical decision-making.  Imaging Review Dg Hand Complete Right  05/22/2015   CLINICAL DATA:  Laceration to PIP joint right third digit.  EXAM: RIGHT HAND - COMPLETE 3+ VIEW  COMPARISON:  01/01/2010  FINDINGS: There is no evidence of fracture or dislocation. There is no evidence of arthropathy or other focal bone abnormality. Soft tissues are unremarkable.  IMPRESSION: Negative.   Electronically Signed   By: Kerby Moors M.D.   On: 05/22/2015 22:39       MDM   Final diagnoses:  None     Lac right middle finger sutured.  Pt to have sutures removed in 2 weeks.  rx keflex  The chart was scribed for me under my direct supervision.  I personally performed the history, physical, and medical decision making and all procedures in the evaluation of this patient.Milton Ferguson, MD 05/26/15 1005

## 2015-05-22 NOTE — Discharge Instructions (Signed)
Clean twice a day with soap and water.  Sutures out in 14 days

## 2015-05-29 ENCOUNTER — Encounter: Payer: Self-pay | Admitting: Cardiovascular Disease

## 2015-07-14 DIAGNOSIS — E118 Type 2 diabetes mellitus with unspecified complications: Secondary | ICD-10-CM | POA: Diagnosis not present

## 2015-07-21 DIAGNOSIS — Z23 Encounter for immunization: Secondary | ICD-10-CM | POA: Diagnosis not present

## 2015-07-21 DIAGNOSIS — I10 Essential (primary) hypertension: Secondary | ICD-10-CM | POA: Diagnosis not present

## 2015-07-21 DIAGNOSIS — E118 Type 2 diabetes mellitus with unspecified complications: Secondary | ICD-10-CM | POA: Diagnosis not present

## 2015-07-21 DIAGNOSIS — M79673 Pain in unspecified foot: Secondary | ICD-10-CM | POA: Diagnosis not present

## 2015-10-15 DIAGNOSIS — E789 Disorder of lipoprotein metabolism, unspecified: Secondary | ICD-10-CM | POA: Diagnosis not present

## 2015-10-15 DIAGNOSIS — Z125 Encounter for screening for malignant neoplasm of prostate: Secondary | ICD-10-CM | POA: Diagnosis not present

## 2015-10-15 DIAGNOSIS — E118 Type 2 diabetes mellitus with unspecified complications: Secondary | ICD-10-CM | POA: Diagnosis not present

## 2015-10-15 DIAGNOSIS — Z0000 Encounter for general adult medical examination without abnormal findings: Secondary | ICD-10-CM | POA: Diagnosis not present

## 2015-10-22 DIAGNOSIS — I10 Essential (primary) hypertension: Secondary | ICD-10-CM | POA: Diagnosis not present

## 2015-10-22 DIAGNOSIS — E789 Disorder of lipoprotein metabolism, unspecified: Secondary | ICD-10-CM | POA: Diagnosis not present

## 2015-10-22 DIAGNOSIS — I2510 Atherosclerotic heart disease of native coronary artery without angina pectoris: Secondary | ICD-10-CM | POA: Diagnosis not present

## 2015-10-22 DIAGNOSIS — E118 Type 2 diabetes mellitus with unspecified complications: Secondary | ICD-10-CM | POA: Diagnosis not present

## 2016-01-14 DIAGNOSIS — E118 Type 2 diabetes mellitus with unspecified complications: Secondary | ICD-10-CM | POA: Diagnosis not present

## 2016-01-14 DIAGNOSIS — E789 Disorder of lipoprotein metabolism, unspecified: Secondary | ICD-10-CM | POA: Diagnosis not present

## 2016-01-21 DIAGNOSIS — E789 Disorder of lipoprotein metabolism, unspecified: Secondary | ICD-10-CM | POA: Diagnosis not present

## 2016-01-21 DIAGNOSIS — E118 Type 2 diabetes mellitus with unspecified complications: Secondary | ICD-10-CM | POA: Diagnosis not present

## 2016-04-14 DIAGNOSIS — E789 Disorder of lipoprotein metabolism, unspecified: Secondary | ICD-10-CM | POA: Diagnosis not present

## 2016-04-14 DIAGNOSIS — E118 Type 2 diabetes mellitus with unspecified complications: Secondary | ICD-10-CM | POA: Diagnosis not present

## 2016-04-21 DIAGNOSIS — E789 Disorder of lipoprotein metabolism, unspecified: Secondary | ICD-10-CM | POA: Diagnosis not present

## 2016-04-21 DIAGNOSIS — I10 Essential (primary) hypertension: Secondary | ICD-10-CM | POA: Diagnosis not present

## 2016-04-21 DIAGNOSIS — E118 Type 2 diabetes mellitus with unspecified complications: Secondary | ICD-10-CM | POA: Diagnosis not present

## 2016-08-17 DIAGNOSIS — E789 Disorder of lipoprotein metabolism, unspecified: Secondary | ICD-10-CM | POA: Diagnosis not present

## 2016-08-17 DIAGNOSIS — I10 Essential (primary) hypertension: Secondary | ICD-10-CM | POA: Diagnosis not present

## 2016-08-17 DIAGNOSIS — E118 Type 2 diabetes mellitus with unspecified complications: Secondary | ICD-10-CM | POA: Diagnosis not present

## 2016-08-24 DIAGNOSIS — I10 Essential (primary) hypertension: Secondary | ICD-10-CM | POA: Diagnosis not present

## 2016-08-24 DIAGNOSIS — E118 Type 2 diabetes mellitus with unspecified complications: Secondary | ICD-10-CM | POA: Diagnosis not present

## 2016-08-24 DIAGNOSIS — E789 Disorder of lipoprotein metabolism, unspecified: Secondary | ICD-10-CM | POA: Diagnosis not present

## 2016-09-01 ENCOUNTER — Telehealth: Payer: Self-pay | Admitting: Cardiovascular Disease

## 2016-09-01 NOTE — Telephone Encounter (Signed)
Raymond [name] with Dr. Dorian Heckle office calling regarding doing a tongue biopsy on the right and left side of his tongue, does he need to go off Plavix, they don't need him too, so only if Dr. Gwenlyn Found wants him to. pls call Raymond [name] [phone]

## 2016-09-01 NOTE — Telephone Encounter (Signed)
[facility] called to confirm OK to hold plavix for biopsy. She informs me they do not need the patient to hold medication. I advised in this case, not to hold. She prefers confirmation from cardiologist. Will route to Dr. Gwenlyn Found to review and follow up w Kathlee Nations.

## 2016-09-02 NOTE — Telephone Encounter (Signed)
Kathlee Nations with [facility] calling to check on status on confirmation of holding Plavix from Dr. Forbes Cellar call [phone]

## 2016-09-03 NOTE — Telephone Encounter (Signed)
Left message for Raymond [name], dr berry will be back next week

## 2016-09-06 NOTE — Telephone Encounter (Signed)
Reviewed this in further detail. He has seen Dr Gwenlyn Found in the past, but not seen in office since paper chart conversion. Have let Kathlee Nations at the oral surgery office know about situation & to reach out to current prescriber of patient's plavix, she voiced understanding and thanks.

## 2016-09-06 NOTE — Telephone Encounter (Signed)
I have not seen pt in office so I am unable to comment  JJB

## 2016-09-28 DIAGNOSIS — K1321 Leukoplakia of oral mucosa, including tongue: Secondary | ICD-10-CM | POA: Diagnosis not present

## 2016-09-28 DIAGNOSIS — D1030 Benign neoplasm of unspecified part of mouth: Secondary | ICD-10-CM | POA: Diagnosis not present

## 2016-09-28 DIAGNOSIS — K148 Other diseases of tongue: Secondary | ICD-10-CM | POA: Diagnosis not present

## 2016-10-19 DIAGNOSIS — Z0000 Encounter for general adult medical examination without abnormal findings: Secondary | ICD-10-CM | POA: Diagnosis not present

## 2016-10-19 DIAGNOSIS — E789 Disorder of lipoprotein metabolism, unspecified: Secondary | ICD-10-CM | POA: Diagnosis not present

## 2016-10-19 DIAGNOSIS — I10 Essential (primary) hypertension: Secondary | ICD-10-CM | POA: Diagnosis not present

## 2016-10-19 DIAGNOSIS — E118 Type 2 diabetes mellitus with unspecified complications: Secondary | ICD-10-CM | POA: Diagnosis not present

## 2016-10-19 DIAGNOSIS — Z125 Encounter for screening for malignant neoplasm of prostate: Secondary | ICD-10-CM | POA: Diagnosis not present

## 2016-10-26 DIAGNOSIS — N390 Urinary tract infection, site not specified: Secondary | ICD-10-CM | POA: Diagnosis not present

## 2016-12-15 ENCOUNTER — Other Ambulatory Visit (HOSPITAL_COMMUNITY)
Admission: RE | Admit: 2016-12-15 | Discharge: 2016-12-15 | Disposition: A | Discharge status: Home / Self Care | Payer: Federal, State, Local not specified - PPO | Source: Other Acute Inpatient Hospital | Attending: Urology | Admitting: Urology

## 2016-12-15 ENCOUNTER — Ambulatory Visit (INDEPENDENT_AMBULATORY_CARE_PROVIDER_SITE_OTHER): Payer: Federal, State, Local not specified - PPO | Admitting: Urology

## 2016-12-15 DIAGNOSIS — R310 Gross hematuria: Secondary | ICD-10-CM | POA: Insufficient documentation

## 2016-12-21 ENCOUNTER — Other Ambulatory Visit: Payer: Self-pay | Admitting: Urology

## 2016-12-21 DIAGNOSIS — R310 Gross hematuria: Secondary | ICD-10-CM

## 2016-12-30 ENCOUNTER — Ambulatory Visit (HOSPITAL_COMMUNITY): Payer: Federal, State, Local not specified - PPO

## 2017-01-04 ENCOUNTER — Ambulatory Visit (HOSPITAL_COMMUNITY): Payer: Federal, State, Local not specified - PPO

## 2017-01-05 ENCOUNTER — Ambulatory Visit: Payer: Federal, State, Local not specified - PPO | Admitting: Urology

## 2017-01-05 ENCOUNTER — Ambulatory Visit (HOSPITAL_COMMUNITY)
Admission: RE | Admit: 2017-01-05 | Discharge: 2017-01-05 | Disposition: A | Discharge status: Home / Self Care | Payer: Federal, State, Local not specified - PPO | Source: Ambulatory Visit | Attending: Urology | Admitting: Urology

## 2017-01-05 DIAGNOSIS — R310 Gross hematuria: Secondary | ICD-10-CM | POA: Diagnosis present

## 2017-01-05 DIAGNOSIS — N329 Bladder disorder, unspecified: Secondary | ICD-10-CM | POA: Diagnosis not present

## 2017-01-05 LAB — POCT I-STAT CREATININE: CREATININE: 1.4 mg/dL — AB (ref 0.61–1.24)

## 2017-01-05 MED ORDER — IOPAMIDOL (ISOVUE-300) INJECTION 61%
125.0000 mL | Freq: Once | INTRAVENOUS | Status: AC | PRN
  Administered 2017-01-05: 125 mL via INTRAVENOUS

## 2017-01-26 ENCOUNTER — Telehealth: Payer: Self-pay

## 2017-01-26 NOTE — Telephone Encounter (Signed)
Pt received a triage letter from [facility] to set up colonoscopy. Please call [phone] (I was involved with something else and forgot to ask him the triage questions)

## 2017-01-26 NOTE — Telephone Encounter (Signed)
PT is not having any problems. No family hx of colon cancer. See separate triage.

## 2017-01-28 ENCOUNTER — Telehealth: Payer: Self-pay

## 2017-02-09 NOTE — Telephone Encounter (Signed)
OK to scheduled.  Day of prep: Humalog 25U BID before meal, Victoza 0.9 U morning before procedure, metformin 500mg  BID with meal. AM of procedure: NO diabetes meds.

## 2017-02-09 NOTE — Telephone Encounter (Addendum)
Gastroenterology Pre-Procedure Review  Request Date: 01/28/2017 Requesting Physician:   Last colonoscopy was 08/16/2007 by Dr. Gala Romney Hx of adenamatous polyp Letter reminder sent 07/04/2012 Pt not having any problems  PATIENT REVIEW QUESTIONS: The patient responded to the following health history questions as indicated:    1. Diabetes Melitis: YES 2. Joint replacements in the past 12 months: no 3. Major health problems in the past 3 months: no 4. Has an artificial valve or MVP: no 5. Has a defibrillator: no 6. Has been advised in past to take antibiotics in advance of a procedure like teeth cleaning: no 7. Family history of colon cancer: no  8. Alcohol Use: no 9. History of sleep apnea: no  10. History of coronary artery or other vascular stents placed within the last 12 months: no    MEDICATIONS & ALLERGIES:    Patient reports the following regarding taking any blood thinners:   Plavix? YES Aspirin? YES Coumadin? no Brilinta? no Xarelto? no Eliquis? no Pradaxa? no Savaysa? no Effient? no  Patient confirms/reports the following medications:  Current Outpatient Prescriptions  Medication Sig Dispense Refill  . aspirin 325 MG tablet Take 325 mg by mouth daily.    . celecoxib (CELEBREX) 200 MG capsule Take 200 mg by mouth 2 (two) times daily.    . Choline Fenofibrate (FENOFIBRIC ACID) 135 MG CPDR Take by mouth.    . Cinnamon 500 MG TABS Take by mouth. Takes 1000 mg qd    . clopidogrel (PLAVIX) 75 MG tablet TAKE 1 TABLET DAILY 90 tablet 3  . ezetimibe (ZETIA) 10 MG tablet Take 10 mg by mouth daily.    . insulin lispro protamine-lispro (HUMALOG 75/25 MIX) (75-25) 100 UNIT/ML SUSP injection Inject 50 Units into the skin 2 (two) times daily with a meal.    . Liraglutide (VICTOZA [place]) Inject 1.8 Units into the skin every morning.    . metformin (FORTAMET) 1000 MG (OSM) 24 hr tablet Take 1,000 mg by mouth 2 (two) times daily with a meal.    . metoprolol tartrate (LOPRESSOR) 25 MG  tablet Take 25 mg by mouth 2 (two) times daily.    . Multiple Vitamin (MULTIVITAMIN) tablet Take 1 tablet by mouth daily.    . NON FORMULARY Co Q 10   100 mg qd    . Omega-3 Fatty Acids (FISH OIL) 1200 MG CAPS Take by mouth.    . rosuvastatin (CRESTOR) 40 MG tablet Take 40 mg by mouth daily.    Marland Kitchen telmisartan (MICARDIS) 80 MG tablet Take 80 mg by mouth daily as needed.     No current facility-administered medications for this visit.     Patient confirms/reports the following allergies:  No Known Allergies  No orders of the defined types were placed in this encounter.   AUTHORIZATION INFORM GATION Primary Insurance:  ID #:  roup #:  Pre-Cert / Auth required: Pre-Cert / Auth #:   Secondary Insurance:   ID #:   Group #:  Pre-Cert / Auth required: Pre-Cert / Auth #:   SCHEDULE INFORMATION: Procedure has been scheduled as follows:  Date:                    Time:   Location:   This Gastroenterology Pre-Precedure Review Form is being routed to the following provider(s): R. Garfield Cornea, MD

## 2017-02-15 ENCOUNTER — Other Ambulatory Visit: Payer: Self-pay

## 2017-02-15 DIAGNOSIS — Z1211 Encounter for screening for malignant neoplasm of colon: Secondary | ICD-10-CM

## 2017-02-15 MED ORDER — PEG 3350-KCL-NA BICARB-NACL 420 G PO SOLR: 4000.0000 mL | ORAL | 0 refills | Status: DC

## 2017-02-15 NOTE — Addendum Note (Signed)
Addended by: Everardo All on: 02/15/2017 03:55 PM   Modules accepted: Orders

## 2017-02-15 NOTE — Telephone Encounter (Signed)
Rx sent to the pharmacy and instructions mailed to pt.  

## 2017-03-23 ENCOUNTER — Ambulatory Visit (INDEPENDENT_AMBULATORY_CARE_PROVIDER_SITE_OTHER): Payer: Federal, State, Local not specified - PPO | Admitting: Urology

## 2017-03-23 DIAGNOSIS — R310 Gross hematuria: Secondary | ICD-10-CM | POA: Diagnosis not present

## 2017-03-24 DIAGNOSIS — E118 Type 2 diabetes mellitus with unspecified complications: Secondary | ICD-10-CM | POA: Diagnosis not present

## 2017-03-24 DIAGNOSIS — C679 Malignant neoplasm of bladder, unspecified: Secondary | ICD-10-CM | POA: Diagnosis not present

## 2017-03-24 DIAGNOSIS — E789 Disorder of lipoprotein metabolism, unspecified: Secondary | ICD-10-CM | POA: Diagnosis not present

## 2017-03-25 NOTE — Patient Instructions (Signed)
Raymond [name]  03/25/2017     @PREFPERIOPPHARMACY @   Your procedure is scheduled on  04/11/2017   Report to [facility] at  [place].M.  Call this number if you have problems the morning of surgery:  [phone]   Remember:  Do not eat food or drink liquids after midnight.  Take these medicines the morning of surgery with A SIP OF WATER  Celebrex,metoprolol,micardis. Take 1/2 of your usual insulin dosage the night before your surgery. DO NOT take any medications for diabetes the morning of your surgery.   Do not wear jewelry, make-up or nail polish.  Do not wear lotions, powders, or perfumes, or deoderant.  Do not shave 48 hours prior to surgery.  Men may shave face and neck.  Do not bring valuables to the hospital.  [facility] is not responsible for any belongings or valuables.  Contacts, dentures or bridgework may not be worn into surgery.  Leave your suitcase in the car.  After surgery it may be brought to your room.  For patients admitted to the hospital, discharge time will be determined by your treatment team.  Patients discharged the day of surgery will not be allowed to drive home.   Name and phone number of your driver:   family Special instructions:  None  Please read over the following fact sheets that you were given. Anesthesia Post-op Instructions and Care and Recovery After Surgery       Cystoscopy Cystoscopy is a procedure that is used to help diagnose and sometimes treat conditions that affect that lower urinary tract. The lower urinary tract includes the bladder and the tube that drains urine from the bladder out of the body (urethra). Cystoscopy is performed with a thin, tube-shaped instrument with a light and camera at the end (cystoscope). The cystoscope may be hard (rigid) or flexible, depending on the goal of the procedure.The cystoscope is inserted through the urethra, into the bladder. Cystoscopy may be recommended if you  have:  Urinary tractinfections that keep coming back (recurring).  Blood in the urine (hematuria).  Loss of bladder control (urinary incontinence) or an overactive bladder.  Unusual cells found in a urine sample.  A blockage in the urethra.  Painful urination.  An abnormality in the bladder found during an intravenous pyelogram (IVP) or CT scan.  Cystoscopy may also be done to remove a sample of tissue to be examined under a microscope (biopsy). Tell a health care provider about:  Any allergies you have.  All medicines you are taking, including vitamins, herbs, eye drops, creams, and over-the-counter medicines.  Any problems you or family members have had with anesthetic medicines.  Any blood disorders you have.  Any surgeries you have had.  Any medical conditions you have.  Whether you are pregnant or may be pregnant. What are the risks? Generally, this is a safe procedure. However, problems may occur, including:  Infection.  Bleeding.  Allergic reactions to medicines.  Damage to other structures or organs.  What happens before the procedure?  Ask your health care provider about: ? Changing or stopping your regular medicines. This is especially important if you are taking diabetes medicines or blood thinners. ? Taking medicines such as aspirin and ibuprofen. These medicines can thin your blood. Do not take these medicines before your procedure if your health care provider instructs you not to.  Follow instructions from your health care  provider about eating or drinking restrictions.  You may be given antibiotic medicine to help prevent infection.  You may have an exam or testing, such as X-rays of the bladder, urethra, or kidneys.  You may have urine tests to check for signs of infection.  Plan to have someone take you home after the procedure. What happens during the procedure?  To reduce your risk of infection,your health care team will wash or sanitize  their hands.  You will be given one or more of the following: ? A medicine to help you relax (sedative). ? A medicine to numb the area (local anesthetic).  The area around the opening of your urethra will be cleaned.  The cystoscope will be passed through your urethra into your bladder.  Germ-free (sterile)fluid will flow through the cystoscope to fill your bladder. The fluid will stretch your bladder so that your surgeon can clearly examine your bladder walls.  The cystoscope will be removed and your bladder will be emptied. The procedure may vary among health care providers and hospitals. What happens after the procedure?  You may have some soreness or pain in your abdomen and urethra. Medicines will be available to help you.  You may have some blood in your urine.  Do not drive for 24 hours if you received a sedative. This information is not intended to replace advice given to you by your health care provider. Make sure you discuss any questions you have with your health care provider. Document Released: 09/10/2000 Document Revised: 01/22/2016 Document Reviewed: 07/31/2015 Elsevier Interactive Patient Education  2017 [place].  Cystoscopy, Care After Refer to this sheet in the next few weeks. These instructions provide you with information about caring for yourself after your procedure. Your health care provider may also give you more specific instructions. Your treatment has been planned according to current medical practices, but problems sometimes occur. Call your health care provider if you have any problems or questions after your procedure. What can I expect after the procedure? After the procedure, it is common to have:  Mild pain when you urinate. Pain should stop within a few minutes after you urinate. This may last for up to 1 week.  A small amount of blood in your urine for several days.  Feeling like you need to urinate but producing only a small amount of  urine.  Follow these instructions at home:  Medicines  Take over-the-counter and prescription medicines only as told by your health care provider.  If you were prescribed an antibiotic medicine, take it as told by your health care provider. Do not stop taking the antibiotic even if you start to feel better. General instructions   Return to your normal activities as told by your health care provider. Ask your health care provider what activities are safe for you.  Do not drive for 24 hours if you received a sedative.  Watch for any blood in your urine. If the amount of blood in your urine increases, call your health care provider.  Follow instructions from your health care provider about eating or drinking restrictions.  If a tissue sample was removed for testing (biopsy) during your procedure, it is your responsibility to get your test results. Ask your health care provider or the department performing the test when your results will be ready.  Drink enough fluid to keep your urine clear or pale yellow.  Keep all follow-up visits as told by your health care provider. This is important. Contact a  health care provider if:  You have pain that gets worse or does not get better with medicine, especially pain when you urinate.  You have difficulty urinating. Get help right away if:  You have more blood in your urine.  You have blood clots in your urine.  You have abdominal pain.  You have a fever or chills.  You are unable to urinate. This information is not intended to replace advice given to you by your health care provider. Make sure you discuss any questions you have with your health care provider. Document Released: 04/02/2005 Document Revised: 02/19/2016 Document Reviewed: 07/31/2015 Elsevier Interactive Patient Education  2017 [employer]  Transurethral Resection of Bladder Tumor Transurethral resection of a bladder tumor is the removal (resection) of a cancerous  growth (tumor) on the inside wall of the bladder. The bladder is the organ that holds urine. The tumor is removed through the tube that carries urine out of the body (urethra). In a transurethral resection, a thin telescope with a light, a tiny camera, and an electric cutting edge (resectoscope) is passed through the urethra. In men, the opening of the urethra is at the end of the penis. In women, it is just above the vaginal opening. Tell a health care provider about:  Any allergies you have.  All medicines you are taking, including vitamins, herbs, eye drops, creams, and over-the-counter medicines.  Any problems you or family members have had with anesthetic medicines.  Any blood disorders you have.  Any surgeries you have had.  Any medical conditions you have.  Any recent urinary tract infections you have had.  Whether you are pregnant or may be pregnant. What are the risks? Generally, this is a safe procedure. However, problems may occur, including:  Infection.  Bleeding.  Allergic reactions to medicines.  Damage to other structures or organs, such as: ? The urethra. ? The tubes that drain urine from the kidneys into the bladder (ureters).  Pain and burning during urination.  Difficulty urinating due to partial blockage of the urethra.  Inability to urinate (urinary retention).  What happens before the procedure?  Follow instructions from your health care provider about eating and drinking restrictions.  Ask your health care provider about: ? Changing or stopping your regular medicines. This is especially important if you are taking diabetes medicines or blood thinners. ? Taking medicines such as aspirin and ibuprofen. These medicines can thin your blood. Do not take these medicines before your procedure if your health care provider instructs you not to.  You may have a physical exam.  You may have tests, including: ? Blood tests. ? Urine  tests. ? Electrocardiogram (ECG). This test measures the electrical activity of the heart.  You may be given antibiotic medicine to help prevent infection.  Ask your health care provider how your surgical site will be marked or identified.  Plan to have someone take you home after the procedure. What happens during the procedure?  To reduce your risk of infection: ? Your health care team will wash or sanitize their hands. ? Your skin will be washed with soap.  An IV tube will be inserted into one of your veins.  You will be given one or more of the following: ? A medicine to help you relax (sedative). ? A medicine to make you fall asleep (general anesthetic). ? A medicine that is injected into your spine to numb the area below and slightly above the injection site (spinal anesthetic).  Your legs  will be placed in foot rests (stirrups) so that your legs are apart and your knees are bent.  The resectoscope will be passed through your urethra and into your bladder.  The part of your bladder that is affected by the tumor will be resected using the cutting edge of the resectoscope.  The resectoscope will be removed.  A thin, flexible tube (catheter) will be passed through your urethra and into your bladder. The catheter will drain urine into a bag outside of your body. ? Fluid may be passed through the catheter to keep the catheter open. The procedure may vary among health care providers and hospitals. What happens after the procedure?  Your blood pressure, heart rate, breathing rate, and blood oxygen level will be monitored often until the medicines you were given have worn off.  You may continue to receive fluids and medicines through an IV tube.  You will have some pain. Pain medicine will be available to help you.  You will have a catheter draining your urine. ? You will have blood in your urine. Your catheter may be kept in until your urine is clear. ? Your urinary drainage  will be monitored. If necessary, your bladder may be rinsed out (irrigated) by passing fluid through your catheter.  You will be encouraged to walk around as soon as possible.  You may have to wear compression stockings. These stockings help to prevent blood clots and reduce swelling in your legs.  Do not drive for 24 hours if you received a sedative. This information is not intended to replace advice given to you by your health care provider. Make sure you discuss any questions you have with your health care provider. Document Released: 07/10/2009 Document Revised: 05/16/2016 Document Reviewed: 06/05/2015 Elsevier Interactive Patient Education  2018 [place].  Transurethral Resection of Bladder Tumor, Care After Refer to this sheet in the next few weeks. These instructions provide you with information about caring for yourself after your procedure. Your health care provider may also give you more specific instructions. Your treatment has been planned according to current medical practices, but problems sometimes occur. Call your health care provider if you have any problems or questions after your procedure. What can I expect after the procedure? After the procedure, it is common to have:  A small amount of blood in your urine for up to 2 weeks.  Soreness or mild discomfort from your catheter. After your catheter is removed, you may have mild soreness, especially when urinating.  Pain in your lower abdomen.  Follow these instructions at home:  Medicines  Take over-the-counter and prescription medicines only as told by your health care provider.  Do not drive or operate heavy machinery while taking prescription pain medicine.  Do not drive for 24 hours if you received a sedative.  If you were prescribed antibiotic medicine, take it as told by your health care provider. Do not stop taking the antibiotic even if you start to feel better. Activity  Return to your normal  activities as told by your health care provider. Ask your health care provider what activities are safe for you.  Do not lift anything that is heavier than 10 lb (4.5 kg) for as long as told by your health care provider.  Avoid intense physical activity for as long as told by your health care provider.  Walk at least one time every day. This helps to prevent blood clots. You may increase your physical activity gradually as you start to  feel better. General instructions  Do not drink alcohol for as long as told by your health care provider. This is especially important if you are taking prescription pain medicines.  Do not take baths, swim, or use a hot tub until your health care provider approves.  If you have a catheter, follow instructions from your health care provider about caring for your catheter and your drainage bag.  Drink enough fluid to keep your urine clear or pale yellow.  Wear compression stockings as told by your health care provider. These stockings help to prevent blood clots and reduce swelling in your legs.  Keep all follow-up visits as told by your health care provider. This is important. Contact a health care provider if:  You have pain that gets worse or does not improve with medicine.  You have blood in your urine for more than 2 weeks.  You have cloudy or bad-smelling urine.  You become constipated. Signs of constipation may include having: ? Fewer than three bowel movements in a week. ? Difficulty having a bowel movement. ? Stools that are dry, hard, or larger than normal.  You have a fever. Get help right away if:  You have: ? Severe pain. ? Bright-red blood in your urine. ? Blood clots in your urine. ? A lot of blood in your urine.  Your catheter has been removed and you are not able to urinate.  You have a catheter in place and the catheter is not draining urine. This information is not intended to replace advice given to you by your health  care provider. Make sure you discuss any questions you have with your health care provider. Document Released: 08/25/2015 Document Revised: 05/16/2016 Document Reviewed: 06/05/2015 Elsevier Interactive Patient Education  2018 [place] Anesthesia, Adult General anesthesia is the use of medicines to make a person "go to sleep" (be unconscious) for a medical procedure. General anesthesia is often recommended when a procedure:  Is long.  Requires you to be still or in an unusual position.  Is major and can cause you to lose blood.  Is impossible to do without general anesthesia.  The medicines used for general anesthesia are called general anesthetics. In addition to making you sleep, the medicines:  Prevent pain.  Control your blood pressure.  Relax your muscles.  Tell a health care provider about:  Any allergies you have.  All medicines you are taking, including vitamins, herbs, eye drops, creams, and over-the-counter medicines.  Any problems you or family members have had with anesthetic medicines.  Types of anesthetics you have had in the past.  Any bleeding disorders you have.  Any surgeries you have had.  Any medical conditions you have.  Any history of heart or lung conditions, such as heart failure, sleep apnea, or chronic obstructive pulmonary disease (COPD).  Whether you are pregnant or may be pregnant.  Whether you use tobacco, alcohol, marijuana, or street drugs.  Any history of Armed forces logistics/support/administrative officer.  Any history of depression or anxiety. What are the risks? Generally, this is a safe procedure. However, problems may occur, including:  Allergic reaction to anesthetics.  Lung and heart problems.  Inhaling food or liquids from your stomach into your lungs (aspiration).  Injury to nerves.  Waking up during your procedure and being unable to move (rare).  Extreme agitation or a state of mental confusion (delirium) when you wake up from the  anesthetic.  Air in the bloodstream, which can lead to stroke.  These  problems are more likely to develop if you are having a major surgery or if you have an advanced medical condition. You can prevent some of these complications by answering all of your health care provider's questions thoroughly and by following all pre-procedure instructions. General anesthesia can cause side effects, including:  Nausea or vomiting  A sore throat from the breathing tube.  Feeling cold or shivery.  Feeling tired, washed out, or achy.  Sleepiness or drowsiness.  Confusion or agitation.  What happens before the procedure? Staying hydrated Follow instructions from your health care provider about hydration, which may include:  Up to 2 hours before the procedure - you may continue to drink clear liquids, such as water, clear fruit juice, black coffee, and plain tea.  Eating and drinking restrictions Follow instructions from your health care provider about eating and drinking, which may include:  8 hours before the procedure - stop eating heavy meals or foods such as meat, fried foods, or fatty foods.  6 hours before the procedure - stop eating light meals or foods, such as toast or cereal.  6 hours before the procedure - stop drinking milk or drinks that contain milk.  2 hours before the procedure - stop drinking clear liquids.  Medicines  Ask your health care provider about: ? Changing or stopping your regular medicines. This is especially important if you are taking diabetes medicines or blood thinners. ? Taking medicines such as aspirin and ibuprofen. These medicines can thin your blood. Do not take these medicines before your procedure if your health care provider instructs you not to. ? Taking new dietary supplements or medicines. Do not take these during the week before your procedure unless your health care provider approves them.  If you are told to take a medicine or to continue  taking a medicine on the day of the procedure, take the medicine with sips of water. General instructions   Ask if you will be going home the same day, the following day, or after a longer hospital stay. ? Plan to have someone take you home. ? Plan to have someone stay with you for the first 24 hours after you leave the hospital or clinic.  For 3-6 weeks before the procedure, try not to use any tobacco products, such as cigarettes, chewing tobacco, and e-cigarettes.  You may brush your teeth on the morning of the procedure, but make sure to spit out the toothpaste. What happens during the procedure?  You will be given anesthetics through a mask and through an IV tube in one of your veins.  You may receive medicine to help you relax (sedative).  As soon as you are asleep, a breathing tube may be used to help you breathe.  An anesthesia specialist will stay with you throughout the procedure. He or she will help keep you comfortable and safe by continuing to give you medicines and adjusting the amount of medicine that you get. He or she will also watch your blood pressure, pulse, and oxygen levels to make sure that the anesthetics do not cause any problems.  If a breathing tube was used to help you breathe, it will be removed before you wake up. The procedure may vary among health care providers and hospitals. What happens after the procedure?  You will wake up, often slowly, after the procedure is complete, usually in a recovery area.  Your blood pressure, heart rate, breathing rate, and blood oxygen level will be monitored until the medicines you  were given have worn off.  You may be given medicine to help you calm down if you feel anxious or agitated.  If you will be going home the same day, your health care provider may check to make sure you can stand, drink, and urinate.  Your health care providers will treat your pain and side effects before you go home.  Do not drive for 24  hours if you received a sedative.  You may: ? Feel nauseous and vomit. ? Have a sore throat. ? Have mental slowness. ? Feel cold or shivery. ? Feel sleepy. ? Feel tired. ? Feel sore or achy, even in parts of your body where you did not have surgery. This information is not intended to replace advice given to you by your health care provider. Make sure you discuss any questions you have with your health care provider. Document Released: 12/21/2007 Document Revised: 02/24/2016 Document Reviewed: 08/28/2015 Elsevier Interactive Patient Education  2018 [place] Anesthesia, Adult, Care After These instructions provide you with information about caring for yourself after your procedure. Your health care provider may also give you more specific instructions. Your treatment has been planned according to current medical practices, but problems sometimes occur. Call your health care provider if you have any problems or questions after your procedure. What can I expect after the procedure? After the procedure, it is common to have:  Vomiting.  A sore throat.  Mental slowness.  It is common to feel:  Nauseous.  Cold or shivery.  Sleepy.  Tired.  Sore or achy, even in parts of your body where you did not have surgery.  Follow these instructions at home: For at least 24 hours after the procedure:  Do not: ? Participate in activities where you could fall or become injured. ? Drive. ? Use heavy machinery. ? Drink alcohol. ? Take sleeping pills or medicines that cause drowsiness. ? Make important decisions or sign legal documents. ? Take care of children on your own.  Rest. Eating and drinking  If you vomit, drink water, juice, or soup when you can drink without vomiting.  Drink enough fluid to keep your urine clear or pale yellow.  Make sure you have little or no nausea before eating solid foods.  Follow the diet recommended by your health care  provider. General instructions  Have a responsible adult stay with you until you are awake and alert.  Return to your normal activities as told by your health care provider. Ask your health care provider what activities are safe for you.  Take over-the-counter and prescription medicines only as told by your health care provider.  If you smoke, do not smoke without supervision.  Keep all follow-up visits as told by your health care provider. This is important. Contact a health care provider if:  You continue to have nausea or vomiting at home, and medicines are not helpful.  You cannot drink fluids or start eating again.  You cannot urinate after 8-12 hours.  You develop a skin rash.  You have fever.  You have increasing redness at the site of your procedure. Get help right away if:  You have difficulty breathing.  You have chest pain.  You have unexpected bleeding.  You feel that you are having a life-threatening or urgent problem. This information is not intended to replace advice given to you by your health care provider. Make sure you discuss any questions you have with your health care provider. Document Released: 12/20/2000 Document  Revised: 02/16/2016 Document Reviewed: 08/28/2015 Elsevier Interactive Patient Education  [employer].

## 2017-04-01 ENCOUNTER — Ambulatory Visit (HOSPITAL_COMMUNITY)
Admission: RE | Admit: 2017-04-01 | Discharge: 2017-04-01 | Disposition: A | Discharge status: Home / Self Care | Payer: Federal, State, Local not specified - PPO | Source: Ambulatory Visit | Attending: Internal Medicine | Admitting: Internal Medicine

## 2017-04-01 ENCOUNTER — Encounter (HOSPITAL_COMMUNITY): Payer: Self-pay | Admitting: *Deleted

## 2017-04-01 ENCOUNTER — Encounter (HOSPITAL_COMMUNITY)
Admission: RE | Disposition: A | Discharge status: Home / Self Care | Payer: Self-pay | Source: Ambulatory Visit | Attending: Internal Medicine

## 2017-04-01 DIAGNOSIS — I252 Old myocardial infarction: Secondary | ICD-10-CM | POA: Insufficient documentation

## 2017-04-01 DIAGNOSIS — Z86010 Personal history of colonic polyps: Secondary | ICD-10-CM

## 2017-04-01 DIAGNOSIS — Z7982 Long term (current) use of aspirin: Secondary | ICD-10-CM | POA: Diagnosis not present

## 2017-04-01 DIAGNOSIS — E119 Type 2 diabetes mellitus without complications: Secondary | ICD-10-CM | POA: Insufficient documentation

## 2017-04-01 DIAGNOSIS — E7800 Pure hypercholesterolemia, unspecified: Secondary | ICD-10-CM | POA: Insufficient documentation

## 2017-04-01 DIAGNOSIS — Z1211 Encounter for screening for malignant neoplasm of colon: Secondary | ICD-10-CM | POA: Diagnosis present

## 2017-04-01 DIAGNOSIS — Z79899 Other long term (current) drug therapy: Secondary | ICD-10-CM | POA: Diagnosis not present

## 2017-04-01 DIAGNOSIS — Z823 Family history of stroke: Secondary | ICD-10-CM | POA: Insufficient documentation

## 2017-04-01 DIAGNOSIS — Z794 Long term (current) use of insulin: Secondary | ICD-10-CM | POA: Diagnosis not present

## 2017-04-01 DIAGNOSIS — Z800 Family history of malignant neoplasm of digestive organs: Secondary | ICD-10-CM | POA: Diagnosis not present

## 2017-04-01 DIAGNOSIS — K5730 Diverticulosis of large intestine without perforation or abscess without bleeding: Secondary | ICD-10-CM | POA: Insufficient documentation

## 2017-04-01 DIAGNOSIS — Z833 Family history of diabetes mellitus: Secondary | ICD-10-CM | POA: Diagnosis not present

## 2017-04-01 DIAGNOSIS — Z801 Family history of malignant neoplasm of trachea, bronchus and lung: Secondary | ICD-10-CM | POA: Diagnosis not present

## 2017-04-01 HISTORY — PX: COLONOSCOPY: SHX5424

## 2017-04-01 LAB — GLUCOSE, CAPILLARY: Glucose-Capillary: 187 mg/dL — ABNORMAL HIGH (ref 65–99)

## 2017-04-01 SURGERY — COLONOSCOPY
Anesthesia: Moderate Sedation

## 2017-04-01 MED ORDER — MIDAZOLAM HCL 5 MG/5ML IJ SOLN
INTRAMUSCULAR | Status: DC | PRN
  Administered 2017-04-01 (×2): 2 mg via INTRAVENOUS

## 2017-04-01 MED ORDER — MEPERIDINE HCL 100 MG/ML IJ SOLN
INTRAMUSCULAR | Status: AC
  Filled 2017-04-01: qty 2

## 2017-04-01 MED ORDER — MIDAZOLAM HCL 5 MG/5ML IJ SOLN
INTRAMUSCULAR | Status: AC
  Filled 2017-04-01: qty 10

## 2017-04-01 MED ORDER — MEPERIDINE HCL 100 MG/ML IJ SOLN
INTRAMUSCULAR | Status: DC | PRN
  Administered 2017-04-01: 50 mg via INTRAVENOUS
  Administered 2017-04-01: 25 mg via INTRAVENOUS

## 2017-04-01 MED ORDER — SODIUM CHLORIDE 0.9 % IV SOLN
INTRAVENOUS | Status: DC
  Administered 2017-04-01: 09:00:00 via INTRAVENOUS

## 2017-04-01 MED ORDER — ONDANSETRON HCL 4 MG/2ML IJ SOLN
INTRAMUSCULAR | Status: AC
  Filled 2017-04-01: qty 2

## 2017-04-01 MED ORDER — ONDANSETRON HCL 4 MG/2ML IJ SOLN
INTRAMUSCULAR | Status: DC | PRN
  Administered 2017-04-01: 4 mg via INTRAVENOUS

## 2017-04-01 MED ORDER — STERILE WATER FOR IRRIGATION IR SOLN
Status: DC | PRN
  Administered 2017-04-01: 10:00:00

## 2017-04-01 NOTE — Op Note (Signed)
[facility] Patient Name: Raymond [name] Procedure Date: 04/01/2017 9:42 AM MRN: [id number] Date of Birth: 1956/04/26 Attending MD: Norvel Richards , MD CSN: [id number] Age: 61 Admit Type: Outpatient Procedure:                Colonoscopy Indications:              High risk colon cancer surveillance: Personal                            history of colonic polyps Providers:                Norvel Richards, MD, Janeece Riggers, RN, Aram Candela Referring MD:              Medicines:                Midazolam 4 mg IV, Meperidine 75 mg IV Complications:            No immediate complications. Estimated Blood Loss:     Estimated blood loss: none. Procedure:                Pre-Anesthesia Assessment:                           - Prior to the procedure, a History and Physical                            was performed, and patient medications and                            allergies were reviewed. The patient's tolerance of                            previous anesthesia was also reviewed. The risks                            and benefits of the procedure and the sedation                            options and risks were discussed with the patient.                            All questions were answered, and informed consent                            was obtained. Prior Anticoagulants: The patient has                            taken no previous anticoagulant or antiplatelet                            agents. ASA Grade Assessment: II - A patient with  mild systemic disease. After reviewing the risks                            and benefits, the patient was deemed in                            satisfactory condition to undergo the procedure.                           After obtaining informed consent, the colonoscope                            was passed under direct vision. Throughout the                            procedure, the patient's blood  pressure, pulse, and                            oxygen saturations were monitored continuously. The                            EC-[id number] ([id number]) scope was introduced through                            the anus and advanced to the the cecum, identified                            by appendiceal orifice and ileocecal valve. The                            colonoscopy was performed without difficulty. The                            patient tolerated the procedure well. The quality                            of the bowel preparation was adequate. The entire                            colon was well visualized. The ileocecal valve,                            appendiceal orifice, and rectum were photographed. Scope In: 10:08:27 AM Scope Out: 10:20:06 AM Scope Withdrawal Time: 0 hours 7 minutes 43 seconds  Total Procedure Duration: 0 hours 11 minutes 39 seconds  Findings:      The perianal and digital rectal examinations were normal.      Scattered small-mouthed diverticula were found in the sigmoid colon and       descending colon. single 3 mm erosion ascending colon. Prominent anal       papilla Impression:               - Diverticulosis in the sigmoid colon and in the  descending colon.                           - No specimens collected. No polyps found today. He                            can be returned to the average risk screening                            gtroup. Moderate Sedation:      Moderate (conscious) sedation was administered by the endoscopy nurse       and supervised by the endoscopist. The following parameters were       monitored: oxygen saturation, heart rate, blood pressure, respiratory       rate, EKG, adequacy of pulmonary ventilation, and response to care.       Total physician intraservice time was 21 minutes. Recommendation:           - Patient has a contact number available for                            emergencies. The signs and symptoms  of potential                            delayed complications were discussed with the                            patient. Return to normal activities tomorrow.                            Written discharge instructions were provided to the                            patient.                           - Resume previous diet.                           - Continue present medications.                           - Repeat colonoscopy in 10 years for screening                            purposes.                           - Return to GI clinic PRN. Procedure Code(s):        --- Professional ---                           [phone], Colonoscopy, flexible; diagnostic, including                            collection of specimen(s) by brushing or washing,  when performed (separate procedure)                           99152, Moderate sedation services provided by the                            same physician or other qualified health care                            professional performing the diagnostic or                            therapeutic service that the sedation supports,                            requiring the presence of an independent trained                            observer to assist in the monitoring of the                            patient's level of consciousness and physiological                            status; initial 15 minutes of intraservice time,                            patient age 27 years or older Diagnosis Code(s):        --- Professional ---                           Z86.010, Personal history of colonic polyps                           K57.30, Diverticulosis of large intestine without                            perforation or abscess without bleeding CPT copyright 2016 American Medical Association. All rights reserved. The codes documented in this report are preliminary and upon coder review may  be revised to meet current compliance  requirements. Cristopher Estimable. Tomas Schamp, MD Norvel Richards, MD 04/01/2017 10:27:34 AM This report has been signed electronically. Number of Addenda: 0

## 2017-04-01 NOTE — Discharge Instructions (Addendum)
Colonoscopy Discharge Instructions  Read the instructions outlined below and refer to this sheet in the next few weeks. These discharge instructions provide you with general information on caring for yourself after you leave the hospital. Your doctor may also give you specific instructions. While your treatment has been planned according to the most current medical practices available, unavoidable complications occasionally occur. If you have any problems or questions after discharge, call Dr. Gala Romney at [phone]. ACTIVITY  You may resume your regular activity, but move at a slower pace for the next 24 hours.   Take frequent rest periods for the next 24 hours.   Walking will help get rid of the air and reduce the bloated feeling in your belly (abdomen).   No driving for 24 hours (because of the medicine (anesthesia) used during the test).    Do not sign any important legal documents or operate any machinery for 24 hours (because of the anesthesia used during the test).  NUTRITION  Drink plenty of fluids.   You may resume your normal diet as instructed by your doctor.   Begin with a light meal and progress to your normal diet. Heavy or fried foods are harder to digest and may make you feel sick to your stomach (nauseated).   Avoid alcoholic beverages for 24 hours or as instructed.  MEDICATIONS  You may resume your normal medications unless your doctor tells you otherwise.  WHAT YOU CAN EXPECT TODAY  Some feelings of bloating in the abdomen.   Passage of more gas than usual.   Spotting of blood in your stool or on the toilet paper.  IF YOU HAD POLYPS REMOVED DURING THE COLONOSCOPY:  No aspirin products for 7 days or as instructed.   No alcohol for 7 days or as instructed.   Eat a soft diet for the next 24 hours.  FINDING OUT THE RESULTS OF YOUR TEST Not all test results are available during your visit. If your test results are not back during the visit, make an appointment  with your caregiver to find out the results. Do not assume everything is normal if you have not heard from your caregiver or the medical facility. It is important for you to follow up on all of your test results.  SEEK IMMEDIATE MEDICAL ATTENTION IF:  You have more than a spotting of blood in your stool.   Your belly is swollen (abdominal distention).   You are nauseated or vomiting.   You have a temperature over 101.   You have abdominal pain or discomfort that is severe or gets worse throughout the day.    Colon diverticulosis information provided  1 more screening colonoscopy in 10 years.   Diverticulosis Diverticulosis is a condition that develops when small pouches (diverticula) form in the wall of the large intestine (colon). The colon is where water is absorbed and stool is formed. The pouches form when the inside layer of the colon pushes through weak spots in the outer layers of the colon. You may have a few pouches or many of them. What are the causes? The cause of this condition is not known. What increases the risk? The following factors may make you more likely to develop this condition:  Being older than age 61. Your risk for this condition increases with age. Diverticulosis is rare among people younger than age 29. By age 40, many people have it.  Eating a low-fiber diet.  Having frequent constipation.  Being overweight.  Not getting enough  exercise.  Smoking.  Taking over-the-counter pain medicines, like aspirin and ibuprofen.  Having a family history of diverticulosis.  What are the signs or symptoms? In most people, there are no symptoms of this condition. If you do have symptoms, they may include:  Bloating.  Cramps in the abdomen.  Constipation or diarrhea.  Pain in the lower left side of the abdomen.  How is this diagnosed? This condition is most often diagnosed during an exam for other colon problems. Because diverticulosis usually has no  symptoms, it often cannot be diagnosed independently. This condition may be diagnosed by:  Using a flexible scope to examine the colon (colonoscopy).  Taking an X-ray of the colon after dye has been put into the colon (barium enema).  Doing a CT scan.  How is this treated? You may not need treatment for this condition if you have never developed an infection related to diverticulosis. If you have had an infection before, treatment may include:  Eating a high-fiber diet. This may include eating more fruits, vegetables, and grains.  Taking a fiber supplement.  Taking a live bacteria supplement (probiotic).  Taking medicine to relax your colon.  Taking antibiotic medicines.  Follow these instructions at home:  Drink 6-8 glasses of water or more each day to prevent constipation.  Try not to strain when you have a bowel movement.  If you have had an infection before: ? Eat more fiber as directed by your health care provider or your diet and nutrition specialist (dietitian). ? Take a fiber supplement or probiotic, if your health care provider approves.  Take over-the-counter and prescription medicines only as told by your health care provider.  If you were prescribed an antibiotic, take it as told by your health care provider. Do not stop taking the antibiotic even if you start to feel better.  Keep all follow-up visits as told by your health care provider. This is important. Contact a health care provider if:  You have pain in your abdomen.  You have bloating.  You have cramps.  You have not had a bowel movement in 3 days. Get help right away if:  Your pain gets worse.  Your bloating becomes very bad.  You have a fever or chills, and your symptoms suddenly get worse.  You vomit.  You have bowel movements that are bloody or black.  You have bleeding from your rectum. Summary  Diverticulosis is a condition that develops when small pouches (diverticula) form in the  wall of the large intestine (colon).  You may have a few pouches or many of them.  This condition is most often diagnosed during an exam for other colon problems.  If you have had an infection related to diverticulosis, treatment may include increasing the fiber in your diet, taking supplements, or taking medicines. This information is not intended to replace advice given to you by your health care provider. Make sure you discuss any questions you have with your health care provider. Document Released: 06/10/2004 Document Revised: 08/02/2016 Document Reviewed: 08/02/2016 Elsevier Interactive Patient Education  2017 [employer].

## 2017-04-01 NOTE — H&P (Signed)
@LOGO @   Primary Care Physician:  Sinda Du, MD Primary Gastroenterologist:  Dr. Gala Romney  Pre-Procedure History & Physical: HPI:  Raymond [name] is a 61 y.o. male is here for a surveillance colonoscopy.  Colonic adenoma removed 2008. Did not return for 2013 examination. No bowel symptoms currently.  Past Medical History:  Diagnosis Date  . Diabetes mellitus without complication ([place])   . High cholesterol   . MI (myocardial infarction) ([place]) 2009    Past Surgical History:  Procedure Laterality Date  . APPENDECTOMY    . CARDIAC CATHETERIZATION    . COLONOSCOPY    . CORONARY STENT PLACEMENT  2009  . FRACTURE SURGERY Right    Steel Rod in femur  . KNEE ARTHROSCOPY      Prior to Admission medications   Medication Sig Start Date End Date Taking? Authorizing Provider  aspirin 325 MG tablet Take 325 mg by mouth daily.   Yes [provider]  Choline Fenofibrate (FENOFIBRIC ACID) 135 MG CPDR Take by mouth.   Yes [provider]  Cinnamon 500 MG TABS Take by mouth. Takes 1000 mg qd   Yes [provider]  clopidogrel (PLAVIX) 75 MG tablet TAKE 1 TABLET DAILY 02/23/13  Yes Lorretta Harp, MD  ezetimibe (ZETIA) 10 MG tablet Take 10 mg by mouth daily.   Yes [provider]  insulin lispro protamine-lispro (HUMALOG 75/25 MIX) (75-25) 100 UNIT/ML SUSP injection Inject 50 Units into the skin 2 (two) times daily with a meal.   Yes [provider]  Liraglutide (VICTOZA [place]) Inject 1.8 Units into the skin every morning.   Yes [provider]  metformin (FORTAMET) 1000 MG (OSM) 24 hr tablet Take 1,000 mg by mouth 2 (two) times daily with a meal.   Yes [provider]  metoprolol tartrate (LOPRESSOR) 25 MG tablet Take 25 mg by mouth 2 (two) times daily.   Yes [provider]  Multiple Vitamin (MULTIVITAMIN) tablet Take 1 tablet by mouth daily.   Yes [provider]  NON FORMULARY Co Q 10   100 mg qd   Yes [provider]  Omega-3 Fatty Acids (FISH OIL) 1200 MG CAPS Take by mouth.   Yes [provider]  polyethylene glycol-electrolytes (TRILYTE) 420 g solution Take 4,000 mLs by mouth as directed. 02/15/17  Yes Kathelene Rumberger, Cristopher Estimable, MD  rosuvastatin (CRESTOR) 40 MG tablet Take 40 mg by mouth daily.   Yes [provider]  telmisartan (MICARDIS) 80 MG tablet Take 80 mg by mouth daily as needed.   Yes [provider]  celecoxib (CELEBREX) 200 MG capsule Take 200 mg by mouth 2 (two) times daily.    [provider]    Allergies as of 02/15/2017  . (No Known Allergies)    Family History  Problem Relation Age of Onset  . Diabetes Mother   . Heart attack Mother   . Cancer Father        lung  . Stroke Paternal Grandfather   . Colon cancer Paternal Uncle     Social History   Social History  . Marital status: Married    Spouse name: N/A  . Number of children: N/A  . Years of education: N/A   Occupational History  . Not on file.   Social History Main Topics  . Smoking status: Never Smoker  . Smokeless tobacco: Never Used  . Alcohol use No  . Drug use: No  . Sexual activity: Not on file  Other Topics Concern  . Not on file   Social History Narrative  . No narrative on file    Review of Systems: See HPI, otherwise negative ROS  Physical Exam: BP 132/80   Pulse (!) 53   Temp 98.1 F (36.7 C) (Oral)   Resp 13   Ht 6\' 1"  (1.854 m)   Wt 205 lb (93 kg)   SpO2 100%   BMI 27.05 kg/m  General:   Alert,  Well-developed, well-nourished, pleasant and cooperative in NAD Lungs:  Clear throughout to auscultation.   No wheezes, crackles, or rhonchi. No acute distress. Heart:  Regular rate and rhythm; no murmurs, clicks, rubs,  or gallops. Abdomen:  Soft, nontender and nondistended. No masses, hepatosplenomegaly or hernias noted. Normal bowel sounds, without guarding, and without rebound.    Impression/Plan: Raymond [name] is now here to undergo a  Surveillance colonoscopy.  The risks, benefits, limitations, alternatives and imponderables have been reviewed with the patient. Questions have been answered. All parties are agreeable.   Risks, benefits, limitations, imponderables and alternatives regarding colonoscopy have been reviewed with the patient. Questions have been answered. All parties agreeable.     Notice:  This dictation was prepared with Dragon dictation along with smaller phrase technology. Any transcriptional errors that result from this process are unintentional and may not be corrected upon review.

## 2017-04-06 ENCOUNTER — Other Ambulatory Visit: Payer: Self-pay | Admitting: Urology

## 2017-04-06 ENCOUNTER — Other Ambulatory Visit: Payer: Self-pay

## 2017-04-06 ENCOUNTER — Encounter (HOSPITAL_COMMUNITY)
Admission: RE | Admit: 2017-04-06 | Discharge: 2017-04-06 | Disposition: A | Discharge status: Home / Self Care | Payer: Federal, State, Local not specified - PPO | Source: Ambulatory Visit | Attending: Urology | Admitting: Urology

## 2017-04-06 ENCOUNTER — Telehealth: Payer: Self-pay | Admitting: Cardiovascular Disease

## 2017-04-06 ENCOUNTER — Encounter (HOSPITAL_COMMUNITY): Payer: Self-pay

## 2017-04-06 DIAGNOSIS — R001 Bradycardia, unspecified: Secondary | ICD-10-CM | POA: Insufficient documentation

## 2017-04-06 DIAGNOSIS — Z01812 Encounter for preprocedural laboratory examination: Secondary | ICD-10-CM | POA: Diagnosis not present

## 2017-04-06 DIAGNOSIS — Z01818 Encounter for other preprocedural examination: Secondary | ICD-10-CM | POA: Insufficient documentation

## 2017-04-06 DIAGNOSIS — D494 Neoplasm of unspecified behavior of bladder: Secondary | ICD-10-CM | POA: Diagnosis not present

## 2017-04-06 HISTORY — DX: Atherosclerotic heart disease of native coronary artery without angina pectoris: I25.10

## 2017-04-06 HISTORY — DX: Essential (primary) hypertension: I10

## 2017-04-06 HISTORY — DX: Unspecified osteoarthritis, unspecified site: M19.90

## 2017-04-06 HISTORY — DX: Anemia, unspecified: D64.9

## 2017-04-06 LAB — CBC
HCT: 36.8 % — ABNORMAL LOW (ref 39.0–52.0)
Hemoglobin: 12.8 g/dL — ABNORMAL LOW (ref 13.0–17.0)
MCH: 30.8 pg (ref 26.0–34.0)
MCHC: 34.8 g/dL (ref 30.0–36.0)
MCV: 88.7 fL (ref 78.0–100.0)
PLATELETS: 229 10*3/uL (ref 150–400)
RBC: 4.15 MIL/uL — ABNORMAL LOW (ref 4.22–5.81)
RDW: 13.1 % (ref 11.5–15.5)
WBC: 6.8 10*3/uL (ref 4.0–10.5)

## 2017-04-06 LAB — BASIC METABOLIC PANEL
Anion gap: 9 (ref 5–15)
BUN: 30 mg/dL — AB (ref 6–20)
CALCIUM: 9 mg/dL (ref 8.9–10.3)
CO2: 25 mmol/L (ref 22–32)
CREATININE: 1.4 mg/dL — AB (ref 0.61–1.24)
Chloride: 101 mmol/L (ref 101–111)
GFR calc Af Amer: >60 mL/min (ref >60)
GFR, EST NON AFRICAN AMERICAN: 52 mL/min — AB (ref >60)
GLUCOSE: 208 mg/dL — AB (ref 65–99)
Potassium: 4 mmol/L (ref 3.5–5.1)
Sodium: 135 mmol/L (ref 135–145)

## 2017-04-06 NOTE — Telephone Encounter (Signed)
Received records and Request for Surgical Clearance for appointment on 04/08/17 with Dr Sallyanne Kuster.  Records and request put with Dr Croitoru's schedule for 04/08/17. lp

## 2017-04-08 ENCOUNTER — Encounter: Payer: Self-pay | Admitting: Cardiovascular Disease

## 2017-04-08 ENCOUNTER — Ambulatory Visit (INDEPENDENT_AMBULATORY_CARE_PROVIDER_SITE_OTHER): Payer: Federal, State, Local not specified - PPO | Admitting: Cardiovascular Disease

## 2017-04-08 VITALS — BP 108/42 | HR 52 | Ht 73.0 in | Wt 206.8 lb

## 2017-04-08 DIAGNOSIS — E1159 Type 2 diabetes mellitus with other circulatory complications: Secondary | ICD-10-CM

## 2017-04-08 DIAGNOSIS — Z01810 Encounter for preprocedural cardiovascular examination: Secondary | ICD-10-CM | POA: Diagnosis not present

## 2017-04-08 DIAGNOSIS — I2510 Atherosclerotic heart disease of native coronary artery without angina pectoris: Secondary | ICD-10-CM | POA: Diagnosis not present

## 2017-04-08 DIAGNOSIS — Z794 Long term (current) use of insulin: Secondary | ICD-10-CM

## 2017-04-08 DIAGNOSIS — E782 Mixed hyperlipidemia: Secondary | ICD-10-CM | POA: Diagnosis not present

## 2017-04-08 NOTE — Progress Notes (Signed)
Cardiology Consultation Note:    Date:  04/08/2017   ID:  Raymond [name], DOB January 22, 1956, MRN [id number]  PCP:  Sinda Du, MD  Cardiologist:  Sanda Klein, MD    Referring MD: Sinda Du, MD   Reason for consultation     . Surgical Clearance    bladder tumor removal, scheduled for 04/11/17, done by Dr Nicolette Bang    History of Present Illness:    Raymond [name] is a 61 y.o. male who is being seen today for preoperative cardiovascular risk assessment at the request of Sinda Du, MD.   Raymond [name] is a 61 y.o. male with a hx of inferior STEMI in 2009 treated with emergency PCI (2.75 x 15 Promus stent) to the mid-AV groove left circumflex artery. He has not had any coronary events or angina recurrence since that time. He has type 2 DM and hyperlipidemia and requires cystoscopic resection of a 3 cm urinary bladder tumor, scheduled for Monday July 16. Dr. Alyson Ingles stated that all anticoagulant meds can be continued for the procedure.  He has a very active lifestyle. He manages a cattle farm, raises chickens and has a large garden. He denies any problems with shortness of breath or angina during physical activity. He recalls his previous angina as being an "unusual" upper chest discomfort that occurred at rest. It has not happen again since the initial presentation. He reports that recently his glycemic control has been suboptimal. He denies problems with headaches, neurological complaints, intermittent claudication, leg edema, palpitations, syncope, any other vascular problems.  He had an echocardiogram performed at [facility] just a few days ago which is normal except for sinus bradycardia. He has not had a formal functional evaluation in the last 8 years.  At the time of angiography in 2009 he also had a 60% smooth proximal LAD stenosis that was left for medical therapy. Left ventricular systolic function was normal with an EF of 60%.  Past Medical History:  Diagnosis  Date  . Anemia   . Arthritis    Hands and left knee  . Coronary artery disease   . Diabetes mellitus without complication ([place])   . High cholesterol   . Hypertension   . MI (myocardial infarction) ([place]) 2009    Past Surgical History:  Procedure Laterality Date  . APPENDECTOMY    . CARDIAC CATHETERIZATION    . COLONOSCOPY    . CORONARY STENT PLACEMENT  2009  . FRACTURE SURGERY Right    Steel Rod in femur  . KNEE ARTHROSCOPY Left   . TONSILLECTOMY      Current Medications: Current Meds  Medication Sig  . aspirin 325 MG tablet Take 325 mg by mouth at bedtime.   . celecoxib (CELEBREX) 200 MG capsule Take 200 mg by mouth daily as needed (pain).   . Choline Fenofibrate (FENOFIBRIC ACID) 135 MG CPDR Take 135 mg by mouth at bedtime.   Marland Kitchen CINNAMON PO Take 1 tablet by mouth at bedtime.  . clopidogrel (PLAVIX) 75 MG tablet TAKE 1 TABLET DAILY (Patient taking differently: TAKE 1 TABLET AT BEDTIME)  . COENZYME Q10 PO Take 1 capsule by mouth at bedtime.  Marland Kitchen ezetimibe (ZETIA) 10 MG tablet Take 10 mg by mouth at bedtime.   . insulin lispro protamine-lispro (HUMALOG 75/25 MIX) (75-25) 100 UNIT/ML SUSP injection Inject 50 Units into the skin 2 (two) times daily with a meal.  . metFORMIN (GLUCOPHAGE) 500 MG tablet Take 1,000 mg by mouth 2 (two)  times daily with a meal.  . metoprolol succinate (TOPROL-XL) 25 MG 24 hr tablet Take 25 mg by mouth at bedtime.  . Multiple Vitamin (MULTIVITAMIN) tablet Take 1 tablet by mouth daily.  . Omega-3 Fatty Acids (FISH OIL) 1200 MG CAPS Take 1,200 mg by mouth at bedtime.   . rosuvastatin (CRESTOR) 40 MG tablet Take 40 mg by mouth at bedtime.   Marland Kitchen telmisartan (MICARDIS) 80 MG tablet Take 80 mg by mouth at bedtime.   Marland Kitchen VICTOZA 18 MG/3ML SOPN Inject 1.8 mg into the skin daily.     Allergies:   Patient has no known allergies.   Social History   Social History  . Marital status: Married    Spouse name: N/A  . Number of children: N/A  . Years of education:  N/A   Social History Main Topics  . Smoking status: Never Smoker  . Smokeless tobacco: Never Used  . Alcohol use No  . Drug use: No  . Sexual activity: Not Asked   Other Topics Concern  . None   Social History Narrative  . None     Family History: The patient's family history includes Cancer in his father; Colon cancer in his paternal uncle; Diabetes in his mother; Heart attack in his mother; Stroke in his paternal grandfather. ROS:   Please see the history of present illness.     All other systems reviewed and are negative.  EKGs/Labs/Other Studies Reviewed:    The following studies were reviewed today: Coronary angiography, 2009  EKG:  EKG is not ordered today.  The ekg ordered 04/06/2017 demonstrates sinus bradycardia, otherwise normal  Recent Labs: 04/06/2017: BUN 30; Creatinine, Ser 1.40; Hemoglobin 12.8; Platelets 229; Potassium 4.0; Sodium 135  Recent Lipid Panel    Component Value Date/Time   CHOL  07/20/2008 0410    155        ATP III CLASSIFICATION:  <200     mg/dL   Desirable  200-239  mg/dL   Borderline High  >=240    mg/dL   High   TRIG 199 (H) 07/20/2008 0410   HDL 19 (L) 07/20/2008 0410   CHOLHDL 8.2 07/20/2008 0410   VLDL 40 07/20/2008 0410   LDLCALC  07/20/2008 0410    96        Total Cholesterol/HDL:CHD Risk Coronary Heart Disease Risk Table                     Men   Women  1/2 Average Risk   3.4   3.3    Physical Exam:    VS:  BP (!) 108/42 (BP Location: Right Arm, Patient Position: Sitting, Cuff Size: Normal)   Pulse (!) 52   Ht 6\' 1"  (1.854 m)   Wt 206 lb 12.8 oz (93.8 kg)   BMI 27.28 kg/m     Wt Readings from Last 3 Encounters:  04/08/17 206 lb 12.8 oz (93.8 kg)  04/01/17 205 lb (93 kg)  05/22/15 195 lb (88.5 kg)     GEN:  Well nourished, well developed in no acute distress HEENT: Normal NECK: No JVD; No carotid bruits LYMPHATICS: No lymphadenopathy CARDIAC: RRR, no murmurs, rubs, gallops RESPIRATORY:  Clear to auscultation  without rales, wheezing or rhonchi  ABDOMEN: Soft, non-tender, non-distended MUSCULOSKELETAL:  No edema; No deformity  SKIN: Warm and dry NEUROLOGIC:  Alert and oriented x 3 PSYCHIATRIC:  Normal affect   ASSESSMENT:    1. Coronary artery disease involving native coronary artery  of native heart without angina pectoris   2. Preoperative cardiovascular examination   3. Controlled type 2 diabetes mellitus with other circulatory complication, with long-term current use of insulin ([place])   4. Mixed hyperlipidemia    PLAN:    In order of problems listed above:  1. CAD: Asymptomatic with a very active lifestyle, excellent functional status. Continue aspirin, low-dose beta blocker, lipid-lowering therapy. It's reasonable to perform a treadmill stress test since it's been so long since he's had coronary evaluation, but I don't think this should hold up the planned procedure next Monday. When he comes back for a stress test, he should hold his beta blocker to allow adequate evaluation. 2. Preop: Low risk procedure, excellent functional status. From a cardiology point of view it is actually reasonable to hold his clopidogrel for the procedure, although his urologist is fine with performing the procedure on full antiplatelet therapy. 3. DM: Will get labs from his endocrinologist, Dr. Wilson Singer. Target A1c 7% or less. 4. HLP: He is on combination high-dose statin, Zetia, fenofibrate therapy. Will review labs. He may be an excellent candidate for PCS K9 inhibitors added to his statin if he is not at target LDL (under 70).   Medication Adjustments/Labs and Tests Ordered: Current medicines are reviewed at length with the patient today.  Concerns regarding medicines are outlined above.  Orders Placed This Encounter  Procedures  . EXERCISE TOLERANCE TEST   No orders of the defined types were placed in this encounter.   Signed, Sanda Klein, MD  04/08/2017 8:52 AM    [facility]   NAMEGOBLE, FUDALA NO.:  [id number]   MEDICAL RECORD NO.:  [id number]          PATIENT TYPE:  INP   LOCATION:  2909                         FACILITY:  [facility]   PHYSICIAN:  Quay Burow, M.D.   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  Took four  baby aspirin at home and was driven to [facility] ER where he was found  to have inferior ST-segment elevation.  He was treated with  nitroglycerin, IV heparin, and transferred by [facility] to [facility] for  urgent intervention.  His risk factors include family history and 40-  year history of insulin-dependent diabetes.   DESCRIPTION OF PROCEDURE:  The patient was brought to the second floor  [facility] in the postabsorptive state.  He was not  premedicated.  His right groin was prepped and shaved in the usual  sterile fashion.  Xylocaine 1% was used for local anesthesia.  A 6-  French sheath was inserted into the right femoral artery using standard  Seldinger technique.  A 6-French right and left Judkins diagnostic  catheter as well as a 6-French pigtail catheter were used for selective  coronary angiography, left  ventriculography, subselective left internal  mammary artery angiography, and distal abdominal aortography.  Visipaque  dye was used for the entirety of the case.  Retrograde aorta, left  ventricular, and pullback pressures were recorded.   HEMODYNAMIC RESULTS:  1. Aortic systolic pressure 829 and diastolic pressure 74.  2. Left ventricular systolic pressure 937 and diastolic pressure 25.   SELECTIVE CORONARY ANGIOGRAPHY:  1. Left main normal.  2. LAD; LAD was calcified in its  midportion with 60% segmental mid      stenosis as well as 60% proximal segmental stenosis of the diagonal      branch arising from the diseased LAD segment.  3. Ramus branch; this is a large bifurcating vessel that was free of      significant disease.  4. Left circumflex; this was a nondominant vessel with a 99% AV groove      stenosis that was the culprit lesion.  5. Right coronary artery; this is a large dominant vessel with an 80%      stenosis at the crux of the vessel just before the bifurcation of      PDA and PLA.  6. Left ventriculography; RAO and LAO left ventriculograms were      performed using 25 mL of Visipaque dye at 12 mL per second.      Overall LVEF was estimated greater than 60% without focal wall      motion abnormalities.  Left ventriculography was performed in the      RAO and LAO views.  7. Left internal mammary artery; this vessel is subselectively      visualized and was widely patent.  Suitable for use during coronary      artery bypass grafting.  8. Distal abdominal aortography; distal abdominal aortogram was      performed using 20 mL of Visipaque dye at 20 mL per second.  The      renal arteries were widely patent.  Infrarenal abdominal aorta and      iliac bifurcation were free of significant atherosclerotic changes.   IMPRESSION:  Raymond [name] has an aborted inferior wall myocardial  infarction.  This is a result of high-grade lesion in the mid  atrioventricular groove circ.  The distal PLA from the circumflex  courses down the inferior wall.  We will plan to perform percutaneous  coronary intervention and stenting using Angiomax and drug-eluting stent  of the circumflex with staged intervention of the right coronary disease  and probably medical treatment of the LAD diagonal branch bifurcation.   The patient received four baby aspirin at home, 600 mg of p.o. Plavix,  20 mg of IV Pepcid, and Angiomax bolus with an AST of 391.  Visipaque  dye was used  for the entirety of the case.  Retrograde aortic pressures  monitored during the case.  A 200 mcg of intracoronary nitroglycerin was  administered during the case.   Using a 6-French XB 3.5 guide catheter along with 0.014 x 190 Asahi soft  wire and 2.5 x 12 mm Sprinter balloon PTCA was performed.  Following  this a 2.75 x 15 Promus stent was then deployed at 14 and 16 atmospheres  and postdilated with a 3.0 x 12 Rockwood Voyager at 16 atmospheres (3.7-mm  resulting in reduction of 99% mid AV groove circumflex 0% residual TIMI  III flow).  The patient tolerated the procedure well.  There were no  hemodynamic or electrocardiographic sequelae.   OVERALL IMPRESSION:  Successful percutaneous coronary intervention of  stenting of mid atrioventricular groove circumflex, which was patent at  the time of intervention with TIMI III flow and the patient was  asymptomatic with an excellent result.  Plans will be to discontinue the  Angiomax and perform intervention in distal right in several days with  medical treatment of the left anterior descending diagonal branch and  outpatient Myoview stress testing to determine physiologic significance.  The patient will be treated with aspirin, Plavix, beta-blocker, statin  drug, and ACE inhibitor.  He left the laboratory in stable condition.      Quay Burow, M.D.  Electronically Signed     JB/MEDQ  D:  07/19/2008  T:  07/20/2008  Job:  [id number]   cc:   [place] Luan Pulling, M.D.

## 2017-04-08 NOTE — Patient Instructions (Signed)
You have been cleared for your surgery Monday. Please follow the following medication changes: HOLD Clopidogrel (Plavix) starting today until after the surgery HOLD Metoprolol the night BEFORE the surgery  Your physician has requested that you have an exercise tolerance test at your convenience after the surgery. For further information please visit [email or web address]. Please also follow instruction sheet, as given.  Dr Sallyanne Kuster recommends that you schedule a follow-up appointment in 12 months. You will receive a reminder letter in the mail two months in advance. If you don't receive a letter, please call our office to schedule the follow-up appointment.  If you need a refill on your cardiac medications before your next appointment, please call your pharmacy.

## 2017-04-11 ENCOUNTER — Ambulatory Visit (HOSPITAL_COMMUNITY): Payer: Federal, State, Local not specified - PPO | Admitting: Anesthesiology

## 2017-04-11 ENCOUNTER — Ambulatory Visit: Payer: Federal, State, Local not specified - PPO | Admitting: Cardiology

## 2017-04-11 ENCOUNTER — Encounter (HOSPITAL_COMMUNITY): Payer: Self-pay | Admitting: Internal Medicine

## 2017-04-11 ENCOUNTER — Ambulatory Visit (HOSPITAL_COMMUNITY): Payer: Federal, State, Local not specified - PPO

## 2017-04-11 ENCOUNTER — Ambulatory Visit (HOSPITAL_COMMUNITY)
Admission: RE | Admit: 2017-04-11 | Discharge: 2017-04-11 | Disposition: A | Discharge status: Home / Self Care | Payer: Federal, State, Local not specified - PPO | Source: Ambulatory Visit | Attending: Urology | Admitting: Urology

## 2017-04-11 ENCOUNTER — Encounter (HOSPITAL_COMMUNITY)
Admission: RE | Disposition: A | Discharge status: Home / Self Care | Payer: Self-pay | Source: Ambulatory Visit | Attending: Urology

## 2017-04-11 DIAGNOSIS — E7800 Pure hypercholesterolemia, unspecified: Secondary | ICD-10-CM | POA: Insufficient documentation

## 2017-04-11 DIAGNOSIS — C679 Malignant neoplasm of bladder, unspecified: Secondary | ICD-10-CM | POA: Diagnosis not present

## 2017-04-11 DIAGNOSIS — M19042 Primary osteoarthritis, left hand: Secondary | ICD-10-CM | POA: Diagnosis not present

## 2017-04-11 DIAGNOSIS — D414 Neoplasm of uncertain behavior of bladder: Secondary | ICD-10-CM | POA: Diagnosis present

## 2017-04-11 DIAGNOSIS — I252 Old myocardial infarction: Secondary | ICD-10-CM | POA: Diagnosis not present

## 2017-04-11 DIAGNOSIS — Z800 Family history of malignant neoplasm of digestive organs: Secondary | ICD-10-CM | POA: Insufficient documentation

## 2017-04-11 DIAGNOSIS — Z96652 Presence of left artificial knee joint: Secondary | ICD-10-CM | POA: Diagnosis not present

## 2017-04-11 DIAGNOSIS — C672 Malignant neoplasm of lateral wall of bladder: Secondary | ICD-10-CM | POA: Insufficient documentation

## 2017-04-11 DIAGNOSIS — D494 Neoplasm of unspecified behavior of bladder: Secondary | ICD-10-CM | POA: Diagnosis not present

## 2017-04-11 DIAGNOSIS — Z79899 Other long term (current) drug therapy: Secondary | ICD-10-CM | POA: Diagnosis not present

## 2017-04-11 DIAGNOSIS — Z823 Family history of stroke: Secondary | ICD-10-CM | POA: Insufficient documentation

## 2017-04-11 DIAGNOSIS — I10 Essential (primary) hypertension: Secondary | ICD-10-CM | POA: Insufficient documentation

## 2017-04-11 DIAGNOSIS — M19041 Primary osteoarthritis, right hand: Secondary | ICD-10-CM | POA: Insufficient documentation

## 2017-04-11 DIAGNOSIS — Z833 Family history of diabetes mellitus: Secondary | ICD-10-CM | POA: Diagnosis not present

## 2017-04-11 DIAGNOSIS — Z8249 Family history of ischemic heart disease and other diseases of the circulatory system: Secondary | ICD-10-CM | POA: Insufficient documentation

## 2017-04-11 DIAGNOSIS — Z7902 Long term (current) use of antithrombotics/antiplatelets: Secondary | ICD-10-CM | POA: Diagnosis not present

## 2017-04-11 DIAGNOSIS — Z955 Presence of coronary angioplasty implant and graft: Secondary | ICD-10-CM | POA: Insufficient documentation

## 2017-04-11 DIAGNOSIS — Z801 Family history of malignant neoplasm of trachea, bronchus and lung: Secondary | ICD-10-CM | POA: Diagnosis not present

## 2017-04-11 DIAGNOSIS — Z9889 Other specified postprocedural states: Secondary | ICD-10-CM | POA: Insufficient documentation

## 2017-04-11 DIAGNOSIS — E119 Type 2 diabetes mellitus without complications: Secondary | ICD-10-CM | POA: Insufficient documentation

## 2017-04-11 DIAGNOSIS — Z9049 Acquired absence of other specified parts of digestive tract: Secondary | ICD-10-CM | POA: Insufficient documentation

## 2017-04-11 DIAGNOSIS — Z794 Long term (current) use of insulin: Secondary | ICD-10-CM | POA: Insufficient documentation

## 2017-04-11 DIAGNOSIS — R310 Gross hematuria: Secondary | ICD-10-CM | POA: Insufficient documentation

## 2017-04-11 DIAGNOSIS — I2510 Atherosclerotic heart disease of native coronary artery without angina pectoris: Secondary | ICD-10-CM | POA: Insufficient documentation

## 2017-04-11 DIAGNOSIS — Z7982 Long term (current) use of aspirin: Secondary | ICD-10-CM | POA: Insufficient documentation

## 2017-04-11 DIAGNOSIS — M1712 Unilateral primary osteoarthritis, left knee: Secondary | ICD-10-CM | POA: Diagnosis not present

## 2017-04-11 HISTORY — PX: CYSTOSCOPY W/ URETERAL STENT PLACEMENT: SHX1429

## 2017-04-11 HISTORY — PX: TRANSURETHRAL RESECTION OF BLADDER TUMOR: SHX2575

## 2017-04-11 LAB — GLUCOSE, CAPILLARY
GLUCOSE-CAPILLARY: 297 mg/dL — AB (ref 65–99)
Glucose-Capillary: 276 mg/dL — ABNORMAL HIGH (ref 65–99)

## 2017-04-11 SURGERY — TURBT (TRANSURETHRAL RESECTION OF BLADDER TUMOR)
Anesthesia: General | Site: Urethra

## 2017-04-11 MED ORDER — MIDAZOLAM HCL 2 MG/2ML IJ SOLN
1.0000 mg | INTRAMUSCULAR | Status: AC
  Administered 2017-04-11 (×2): 2 mg via INTRAVENOUS
  Filled 2017-04-11 (×2): qty 2

## 2017-04-11 MED ORDER — LACTATED RINGERS IV SOLN
INTRAVENOUS | Status: DC
  Administered 2017-04-11: 09:00:00 via INTRAVENOUS

## 2017-04-11 MED ORDER — FENTANYL CITRATE (PF) 100 MCG/2ML IJ SOLN
INTRAMUSCULAR | Status: DC | PRN
  Administered 2017-04-11: 25 ug via INTRAVENOUS
  Administered 2017-04-11 (×2): 50 ug via INTRAVENOUS

## 2017-04-11 MED ORDER — LIDOCAINE HCL (PF) 1 % IJ SOLN
INTRAMUSCULAR | Status: AC
  Filled 2017-04-11: qty 5

## 2017-04-11 MED ORDER — FENTANYL CITRATE (PF) 100 MCG/2ML IJ SOLN
INTRAMUSCULAR | Status: AC
Start: 2017-04-11 — End: 2017-04-11
  Filled 2017-04-11: qty 2

## 2017-04-11 MED ORDER — FENTANYL CITRATE (PF) 100 MCG/2ML IJ SOLN
INTRAMUSCULAR | Status: AC
  Filled 2017-04-11: qty 2

## 2017-04-11 MED ORDER — SUCCINYLCHOLINE CHLORIDE 20 MG/ML IJ SOLN
INTRAMUSCULAR | Status: AC
  Filled 2017-04-11: qty 1

## 2017-04-11 MED ORDER — CEFAZOLIN SODIUM-DEXTROSE 2-4 GM/100ML-% IV SOLN
2.0000 g | INTRAVENOUS | Status: AC
  Administered 2017-04-11: 2 g via INTRAVENOUS
  Filled 2017-04-11: qty 100

## 2017-04-11 MED ORDER — DIATRIZOATE MEGLUMINE 30 % UR SOLN
URETHRAL | Status: AC
  Filled 2017-04-11: qty 300

## 2017-04-11 MED ORDER — NEOSTIGMINE METHYLSULFATE 10 MG/10ML IV SOLN
INTRAVENOUS | Status: AC
  Filled 2017-04-11: qty 1

## 2017-04-11 MED ORDER — LIDOCAINE HCL 2 % EX GEL
CUTANEOUS | Status: AC
  Filled 2017-04-11: qty 10

## 2017-04-11 MED ORDER — ROCURONIUM BROMIDE 50 MG/5ML IV SOLN
INTRAVENOUS | Status: AC
  Filled 2017-04-11: qty 1

## 2017-04-11 MED ORDER — DIATRIZOATE MEGLUMINE 30 % UR SOLN
URETHRAL | Status: DC | PRN
  Administered 2017-04-11: 20 mL via URETHRAL

## 2017-04-11 MED ORDER — FENTANYL CITRATE (PF) 100 MCG/2ML IJ SOLN: 25.0000 ug | INTRAMUSCULAR | Status: DC | PRN

## 2017-04-11 MED ORDER — PROPOFOL 10 MG/ML IV BOLUS
INTRAVENOUS | Status: AC
  Filled 2017-04-11: qty 40

## 2017-04-11 MED ORDER — OXYCODONE-ACETAMINOPHEN 5-325 MG PO TABS: 1.0000 | ORAL_TABLET | ORAL | 0 refills | Status: DC | PRN

## 2017-04-11 MED ORDER — PROPOFOL 10 MG/ML IV BOLUS
INTRAVENOUS | Status: DC | PRN
  Administered 2017-04-11: 150 mg via INTRAVENOUS
  Administered 2017-04-11: 30 mg via INTRAVENOUS

## 2017-04-11 MED ORDER — ONDANSETRON HCL 4 MG/2ML IJ SOLN
4.0000 mg | Freq: Once | INTRAMUSCULAR | Status: AC
  Administered 2017-04-11: 4 mg via INTRAVENOUS
  Filled 2017-04-11: qty 2

## 2017-04-11 MED ORDER — STERILE WATER FOR IRRIGATION IR SOLN
Status: DC | PRN
  Administered 2017-04-11: 1000 mL
  Administered 2017-04-11: 3000 mL

## 2017-04-11 MED ORDER — GLYCOPYRROLATE 0.2 MG/ML IJ SOLN
0.2000 mg | Freq: Once | INTRAMUSCULAR | Status: AC
  Administered 2017-04-11: 0.2 mg via INTRAVENOUS
  Filled 2017-04-11: qty 1

## 2017-04-11 SURGICAL SUPPLY — 29 items
BAG DRAIN URO TABLE W/ADPT NS (DRAPE) ×4 IMPLANT
BAG DRN 8 ADPR NS SKTRN CSTL (DRAPE) ×2
BAG DRN URN TUBE DRIP CHMBR (OSTOMY) ×2
BAG HAMPER (MISCELLANEOUS) ×4 IMPLANT
BAG URINE DRAIN TURP 4L (OSTOMY) ×4 IMPLANT
CATH FOLEY 3WAY 30CC 22F (CATHETERS) ×2 IMPLANT
CATH FOLEY LATEX FREE 22FR (CATHETERS) ×4
CATH FOLEY LF 22FR (CATHETERS) ×2 IMPLANT
CATH INTERMIT  6FR 70CM (CATHETERS) ×4 IMPLANT
CLOTH BEACON ORANGE TIMEOUT ST (SAFETY) ×4 IMPLANT
DECANTER SPIKE VIAL GLASS SM (MISCELLANEOUS) ×4 IMPLANT
ELECT LOOP 22F BIPOLAR SML (ELECTROSURGICAL) ×4
ELECT REM PT RETURN 9FT ADLT (ELECTROSURGICAL) ×4
ELECTRODE LOOP 22F BIPOLAR SML (ELECTROSURGICAL) ×2 IMPLANT
ELECTRODE REM PT RTRN 9FT ADLT (ELECTROSURGICAL) ×2 IMPLANT
GLOVE BIO SURGEON STRL SZ8 (GLOVE) ×4 IMPLANT
GOWN STRL REUS W/ TWL LRG LVL3 (GOWN DISPOSABLE) ×2 IMPLANT
GOWN STRL REUS W/ TWL XL LVL3 (GOWN DISPOSABLE) ×2 IMPLANT
GOWN STRL REUS W/TWL LRG LVL3 (GOWN DISPOSABLE) ×8 IMPLANT
GOWN STRL REUS W/TWL XL LVL3 (GOWN DISPOSABLE) ×4
GUIDEWIRE STR DUAL SENSOR (WIRE) ×2 IMPLANT
GUIDEWIRE STR ZIPWIRE 035X150 (MISCELLANEOUS) ×4 IMPLANT
IV NS IRRIG 3000ML ARTHROMATIC (IV SOLUTION) ×4 IMPLANT
KIT ROOM TURNOVER AP CYSTO (KITS) ×4 IMPLANT
MANIFOLD NEPTUNE II (INSTRUMENTS) ×4 IMPLANT
PACK CYSTO (CUSTOM PROCEDURE TRAY) ×4 IMPLANT
PAD ARMBOARD 7.5X6 YLW CONV (MISCELLANEOUS) ×4 IMPLANT
SYRINGE 10CC LL (SYRINGE) ×4 IMPLANT
TOWEL OR 17X26 4PK STRL BLUE (TOWEL DISPOSABLE) ×4 IMPLANT

## 2017-04-11 NOTE — Anesthesia Procedure Notes (Signed)
Procedure Name: LMA Insertion Date/Time: 04/11/2017 10:14 AM Performed by: Vista Deck Pre-anesthesia Checklist: Patient identified, Patient being monitored, Emergency Drugs available, Timeout performed and Suction available Patient Re-evaluated:Patient Re-evaluated prior to induction Oxygen Delivery Method: Circle System Utilized Preoxygenation: Pre-oxygenation with 100% oxygen Induction Type: IV induction Ventilation: Mask ventilation without difficulty LMA: LMA inserted LMA Size: 4.0 Number of attempts: 1 Placement Confirmation: positive ETCO2 and breath sounds checked- equal and bilateral Tube secured with: Tape Dental Injury: Teeth and Oropharynx as per pre-operative assessment

## 2017-04-11 NOTE — Anesthesia Postprocedure Evaluation (Signed)
Anesthesia Post Note  Patient: RIELEY HAUSMAN  Procedure(s) Performed: Procedure(s) (LRB): TRANSURETHRAL RESECTION OF BLADDER TUMOR (TURBT) (N/A) CYSTOSCOPY WITH RETROGRADE PYELOGRAM (Bilateral)  Patient location during evaluation: PACU Anesthesia Type: General Level of consciousness: awake and alert Pain management: satisfactory to patient Vital Signs Assessment: post-procedure vital signs reviewed and stable Respiratory status: spontaneous breathing Cardiovascular status: stable Postop Assessment: no signs of nausea or vomiting Anesthetic complications: no     Last Vitals:  Vitals:   04/11/17 1115 04/11/17 1130  BP: 133/65 119/64  Pulse: 63 (!) 55  Resp: 13 15  Temp:      Last Pain:  Vitals:   04/11/17 0834  TempSrc: Oral                 Danilyn Cocke

## 2017-04-11 NOTE — H&P (Signed)
Urology Admission H&P  Chief Complaint: gross hematuria  History of Present Illness: [name] is a 61yo with a bladder tumor found on office cystoscopy  Past Medical History:  Diagnosis Date  . Anemia   . Arthritis    Hands and left knee  . Coronary artery disease   . Diabetes mellitus without complication ([place])   . High cholesterol   . Hypertension   . MI (myocardial infarction) ([place]) 2009   Past Surgical History:  Procedure Laterality Date  . APPENDECTOMY    . CARDIAC CATHETERIZATION    . COLONOSCOPY    . COLONOSCOPY N/A 04/01/2017   Procedure: COLONOSCOPY;  Surgeon: Daneil Dolin, MD;  Location: [facility];  Service: Endoscopy;  Laterality: N/A;  9:45 AM  . CORONARY STENT PLACEMENT  2009  . FRACTURE SURGERY Right    Steel Rod in femur  . KNEE ARTHROSCOPY Left   . TONSILLECTOMY      Home Medications:  Prescriptions Prior to Admission  Medication Sig Dispense Refill Last Dose  . aspirin 325 MG tablet Take 325 mg by mouth at bedtime.    04/10/2017 at Unknown time  . celecoxib (CELEBREX) 200 MG capsule Take 200 mg by mouth daily as needed (pain).    04/10/2017 at Unknown time  . Choline Fenofibrate (FENOFIBRIC ACID) 135 MG CPDR Take 135 mg by mouth at bedtime.    04/10/2017 at Unknown time  . CINNAMON PO Take 1 tablet by mouth at bedtime.   04/10/2017 at Unknown time  . clopidogrel (PLAVIX) 75 MG tablet TAKE 1 TABLET DAILY (Patient taking differently: TAKE 1 TABLET AT BEDTIME) 90 tablet 3 04/07/2017  . COENZYME Q10 PO Take 1 capsule by mouth at bedtime.   04/10/2017 at Unknown time  . ezetimibe (ZETIA) 10 MG tablet Take 10 mg by mouth at bedtime.    04/10/2017 at Unknown time  . insulin lispro protamine-lispro (HUMALOG 75/25 MIX) (75-25) 100 UNIT/ML SUSP injection Inject 50 Units into the skin 2 (two) times daily with a meal.   04/10/2017 at Unknown time  . metFORMIN (GLUCOPHAGE) 500 MG tablet Take 1,000 mg by mouth 2 (two) times daily with a meal.   04/10/2017 at Unknown time  .  metoprolol succinate (TOPROL-XL) 25 MG 24 hr tablet Take 25 mg by mouth at bedtime.   04/10/2017 at 2200  . Multiple Vitamin (MULTIVITAMIN) tablet Take 1 tablet by mouth daily.   04/10/2017 at Unknown time  . Omega-3 Fatty Acids (FISH OIL) 1200 MG CAPS Take 1,200 mg by mouth at bedtime.    04/10/2017 at Unknown time  . rosuvastatin (CRESTOR) 40 MG tablet Take 40 mg by mouth at bedtime.    04/10/2017 at Unknown time  . telmisartan (MICARDIS) 80 MG tablet Take 80 mg by mouth at bedtime.    04/10/2017 at Unknown time  . VICTOZA 18 MG/3ML SOPN Inject 1.8 mg into the skin daily.   04/10/2017 at Unknown time   Allergies: No Known Allergies  Family History  Problem Relation Age of Onset  . Diabetes Mother   . Heart attack Mother   . Cancer Father        lung  . Stroke Paternal Grandfather   . Colon cancer Paternal Uncle    Social History:  reports that he has never smoked. He has never used smokeless tobacco. He reports that he does not drink alcohol or use drugs.  Review of Systems  Genitourinary: Positive for hematuria.  All other systems reviewed and  are negative.   Physical Exam:  Vital signs in last 24 hours: Temp:  [98.3 F (36.8 C)] 98.3 F (36.8 C) (07/16 0834) Pulse Rate:  [55] 55 (07/16 0834) Resp:  [0-70] 70 (07/16 0910) BP: (110-118)/(49-62) 116/62 (07/16 0910) SpO2:  [93 %-97 %] 97 % (07/16 0910) Weight:  [93.4 kg (206 lb)] 93.4 kg (206 lb) (07/16 0834) Physical Exam  Constitutional: He is oriented to person, place, and time. He appears well-developed and well-nourished.  HENT:  Head: Normocephalic and atraumatic.  Eyes: Pupils are equal, round, and reactive to light. EOM are normal.  Neck: Normal range of motion. No thyromegaly present.  Cardiovascular: Normal rate and regular rhythm.   Respiratory: Effort normal. No respiratory distress.  GI: Soft. He exhibits no distension.  Musculoskeletal: Normal range of motion. He exhibits no edema.  Neurological: He is alert and  oriented to person, place, and time.  Skin: Skin is warm and dry.  Psychiatric: He has a normal mood and affect. His behavior is normal. Judgment and thought content normal.    Laboratory Data:  Results for orders placed or performed during the hospital encounter of 04/11/17 (from the past 24 hour(s))  Glucose, capillary     Status: Abnormal   Collection Time: 04/11/17  8:30 AM  Result Value Ref Range   Glucose-Capillary 297 (H) 65 - 99 mg/dL   No results found for this or any previous visit (from the past 240 hour(s)). Creatinine:  Recent Labs  04/06/17 0900  CREATININE 1.40*   Baseline Creatinine: 1.4  Impression/Assessment:  60yo with bladder tumor  Plan:  The risks/benefits/alternatives to TURBT was explained to the patient and he understands and wishes to proceed with surgery  Nicolette Bang 04/11/2017, 9:27 AM

## 2017-04-11 NOTE — Discharge Instructions (Signed)
Indwelling Urinary Catheter Care, Adult  Take good care of your catheter to keep it working and to prevent problems.  How to wear your catheter  Attach your catheter to your leg with tape (adhesive tape) or a leg strap. Make sure it is not too tight. If you use tape, remove any bits of tape that are already on the catheter.  How to wear a drainage bag  You should have:   A large overnight bag.   A small leg bag.    Overnight Bag  You may wear the overnight bag at any time. Always keep the bag below the level of your bladder but off the floor. When you sleep, put a clean plastic bag in a wastebasket. Then hang the bag inside the wastebasket.  Leg Bag  Never wear the leg bag at night. Always wear the leg bag below your knee. Keep the leg bag secure with a leg strap or tape.  How to care for your skin   Clean the skin around the catheter at least once every day.   Shower every day. Do not take baths.   Put creams, lotions, or ointments on your genital area only as told by your doctor.   Do not use powders, sprays, or lotions on your genital area.  How to clean your catheter and your skin  1. Wash your hands with soap and water.  2. Wet a washcloth in warm water and gentle (mild) soap.  3. Use the washcloth to clean the skin where the catheter enters your body. Clean downward and wipe away from the catheter in small circles. Do not wipe toward the catheter.  4. Pat the area dry with a clean towel. Make sure to clean off all soap.  How to care for your drainage bags  Empty your drainage bag when it is ?- full or at least 2-3 times a day. Replace your drainage bag once a month or sooner if it starts to smell bad or look dirty. Do not clean your drainage bag unless told by your doctor.  Emptying a drainage bag    Supplies Needed   Rubbing alcohol.   Gauze pad or cotton ball.   Tape or a leg strap.    Steps  1. Wash your hands with soap and water.  2. Separate (detach) the bag from your leg.  3. Hold the bag over  the toilet or a clean container. Keep the bag below your hips and bladder. This stops pee (urine) from going back into the tube.  4. Open the pour spout at the bottom of the bag.  5. Empty the pee into the toilet or container. Do not let the pour spout touch any surface.  6. Put rubbing alcohol on a gauze pad or cotton ball.  7. Use the gauze pad or cotton ball to clean the pour spout.  8. Close the pour spout.  9. Attach the bag to your leg with tape or a leg strap.  10. Wash your hands.    Changing a drainage bag  Supplies Needed   Alcohol wipes.   A clean drainage bag.   Adhesive tape or a leg strap.    Steps  1. Wash your hands with soap and water.  2. Separate the dirty bag from your leg.  3. Pinch the rubber catheter with your fingers so that pee does not spill out.  4. Separate the catheter tube from the drainage tube where these tubes connect (at the   connection valve). Do not let the tubes touch any surface.  5. Clean the end of the catheter tube with an alcohol wipe. Use a different alcohol wipe to clean the end of the drainage tube.  6. Connect the catheter tube to the drainage tube of the clean bag.  7. Attach the new bag to the leg with adhesive tape or a leg strap.  8. Wash your hands.    How to prevent infection and other problems   Never pull on your catheter or try to remove it. Pulling can damage tissue in your body.   Always wash your hands before and after touching your catheter.   If a leg strap gets wet, replace it with a dry one.   Drink enough fluids to keep your pee clear or pale yellow, or as told by your doctor.   Do not let the drainage bag or tubing touch the floor.   Wear cotton underwear.   If you are male, wipe from front to back after you poop (have a bowel movement).   Check on the catheter often to make sure it works and the tubing is not twisted.  Get help if:   Your pee is cloudy.   Your pee smells unusually bad.   Your pee is not draining into the bag.   Your  tube gets clogged.   Your catheter starts to leak.   Your bladder feels full.  Get help right away if:   You have redness, swelling, or pain where the catheter enters your body.   You have fluid, pus, or a bad smell coming from the area where the catheter enters your body.   The area where the catheter enters your body feels warm.   You have a fever.   You have pain in your:  ? Stomach (abdomen).  ? Legs.  ? Lower back.  ? Bladder.   You see blood fill the catheter.   Your pee is pink or red.   You feel sick to your stomach (nauseous).   You throw up (vomit).   You have chills.   Your catheter gets pulled out.  This information is not intended to replace advice given to you by your health care provider. Make sure you discuss any questions you have with your health care provider.  Document Released: 01/08/2013 Document Revised: 08/11/2016 Document Reviewed: 02/26/2014  [employer]vier Interactive Patient Education  2018 [employer]

## 2017-04-11 NOTE — Transfer of Care (Signed)
Immediate Anesthesia Transfer of Care Note  Patient: Raymond [name]  Procedure(s) Performed: Procedure(s) with comments: TRANSURETHRAL RESECTION OF BLADDER TUMOR (TURBT) (N/A) - 1 HR [id number] BLUE MEDICARE-[id number] CYSTOSCOPY WITH RETROGRADE PYELOGRAM (Bilateral)  Patient Location: PACU  Anesthesia Type:General  Level of Consciousness: awake and alert   Airway & Oxygen Therapy: Patient Spontanous Breathing and non-rebreather face mask  Post-op Assessment: Report given to RN and Post -op Vital signs reviewed and stable  Post vital signs: Reviewed and stable  Last Vitals:  Vitals:   04/11/17 0955 04/11/17 1000  BP: (!) 117/57 (!) 98/45  Pulse:    Resp: (!) 58 17  Temp:      Last Pain:  Vitals:   04/11/17 0834  TempSrc: Oral      Patients Stated Pain Goal: 6 (09/19/81 5003)  Complications: No apparent anesthesia complications

## 2017-04-11 NOTE — Anesthesia Preprocedure Evaluation (Signed)
Anesthesia Evaluation  Patient identified by MRN, date of birth, ID band Patient awake    Reviewed: Allergy & Precautions, NPO status , Patient's Chart, lab work & pertinent test results, reviewed documented beta blocker date and time   Airway Mallampati: II  TM Distance: >3 FB     Dental  (+) Teeth Intact, Implants   Pulmonary neg pulmonary ROS,    breath sounds clear to auscultation       Cardiovascular hypertension, Pt. on medications and Pt. on home beta blockers + CAD, + Past MI and + Cardiac Stents (2009)   Rhythm:Regular Rate:Normal     Neuro/Psych    GI/Hepatic negative GI ROS, Neg liver ROS,   Endo/Other  diabetes, Type 2, Insulin Dependent, Oral Hypoglycemic Agents  Renal/GU      Musculoskeletal  (+) Arthritis ,   Abdominal   Peds  Hematology   Anesthesia Other Findings   Reproductive/Obstetrics                             Anesthesia Physical Anesthesia Plan  ASA: III  Anesthesia Plan: General   Post-op Pain Management:    Induction: Intravenous  PONV Risk Score and Plan:   Airway Management Planned: LMA  Additional Equipment:   Intra-op Plan:   Post-operative Plan: Extubation in OR  Informed Consent: I have reviewed the patients History and Physical, chart, labs and discussed the procedure including the risks, benefits and alternatives for the proposed anesthesia with the patient or authorized representative who has indicated his/her understanding and acceptance.     Plan Discussed with:   Anesthesia Plan Comments:         Anesthesia Quick Evaluation

## 2017-04-11 NOTE — Op Note (Signed)
.  Preoperative diagnosis: bladder tumor  Postoperative diagnosis: Same  Procedure: 1 cystoscopy 2. bilateral retrograde pyelography 3.  Intraoperative fluoroscopy, under one hour, with interpretation 4. Transurethral resection of bladder tumor, large  Attending: Rosie Fate  Anesthesia: General  Estimated blood loss: Minimal  Drains: 22 French foley  Specimens: bladder tumor  Antibiotics: ancef  Findings: 6cm papillary left lateral wall tumor.  Ureteral orifices in normal anatomic location. no hydronephrosis or filling defects in either collecting system  Indications: Patient is a 61 year old male with a history of bladder tumor and gross hematuria.  After discussing treatment options, they decided proceed with transurethral resection of a bladder tumor.  Procedure her in detail: The patient was brought to the operating room and a brief timeout was done to ensure correct patient, correct procedure, correct site.  General anesthesia was administered patient was placed in dorsal lithotomy position.  Their genitalia was then prepped and draped in usual sterile fashion.  A rigid 80 French cystoscope was passed in the urethra and the bladder.  Bladder was inspected and we noted  a 6cm bladder tumor.  the ureteral orifices were in the normal orthotopic locations.  a 6 french ureteral catheter was then instilled into the left ureteral orifice.  a gentle retrograde was obtained and findings noted above. We then turned our attention to the right side. a 6 french ureteral catheter was then instilled into the right ureteral orifice.  a gentle retrograde was obtained and findings noted above. We then removed the cystoscope and placed a resectoscope into the bladder. We proceeded to remove the large clot burden from the bladder. Once this was complete we turned our attention to the bladder tumor. Using the bipolar resectoscope we removed the bladder tumor down to the base. A subsequent muscle deep  biopsy was then taken. Hemostasis was then obtained with electrocautery. We then removed the bladder tumor chips and sent them for pathology. We then re-inspected the bladder and found no residula bleeding.  the bladder was then drained, a 22 French foley was placed and this concluded the procedure which was well tolerated by patient.  Complications: None  Condition: Stable, extubated, transferred to PACU  Plan: Patient is to be discharged home and followup in 5 days for foley catheter removal and pathology discussion.

## 2017-04-12 ENCOUNTER — Encounter (HOSPITAL_COMMUNITY): Payer: Self-pay | Admitting: Urology

## 2017-04-15 ENCOUNTER — Encounter (HOSPITAL_COMMUNITY): Payer: Self-pay | Admitting: Emergency Medicine

## 2017-04-15 ENCOUNTER — Emergency Department (HOSPITAL_COMMUNITY)
Admission: EM | Admit: 2017-04-15 | Discharge: 2017-04-15 | Disposition: A | Discharge status: Home / Self Care | Payer: Federal, State, Local not specified - PPO | Attending: Emergency Medicine | Admitting: Emergency Medicine

## 2017-04-15 DIAGNOSIS — Z794 Long term (current) use of insulin: Secondary | ICD-10-CM | POA: Diagnosis not present

## 2017-04-15 DIAGNOSIS — E119 Type 2 diabetes mellitus without complications: Secondary | ICD-10-CM | POA: Diagnosis not present

## 2017-04-15 DIAGNOSIS — Z7902 Long term (current) use of antithrombotics/antiplatelets: Secondary | ICD-10-CM | POA: Insufficient documentation

## 2017-04-15 DIAGNOSIS — I10 Essential (primary) hypertension: Secondary | ICD-10-CM | POA: Insufficient documentation

## 2017-04-15 DIAGNOSIS — R339 Retention of urine, unspecified: Secondary | ICD-10-CM | POA: Insufficient documentation

## 2017-04-15 DIAGNOSIS — Z79899 Other long term (current) drug therapy: Secondary | ICD-10-CM | POA: Insufficient documentation

## 2017-04-15 DIAGNOSIS — R310 Gross hematuria: Secondary | ICD-10-CM | POA: Diagnosis not present

## 2017-04-15 DIAGNOSIS — Z7982 Long term (current) use of aspirin: Secondary | ICD-10-CM | POA: Insufficient documentation

## 2017-04-15 DIAGNOSIS — Z7984 Long term (current) use of oral hypoglycemic drugs: Secondary | ICD-10-CM | POA: Insufficient documentation

## 2017-04-15 LAB — URINALYSIS, ROUTINE W REFLEX MICROSCOPIC
Bilirubin Urine: NEGATIVE
KETONES UR: NEGATIVE mg/dL
Leukocytes, UA: NEGATIVE
NITRITE: NEGATIVE
PROTEIN: 30 mg/dL — AB
Specific Gravity, Urine: 1.022 (ref 1.005–1.030)
pH: 5 (ref 5.0–8.0)

## 2017-04-15 NOTE — Discharge Instructions (Signed)
Call your Urologist Office on Monday to let them know you had to have a foley put back in.

## 2017-04-15 NOTE — ED Triage Notes (Signed)
Pt had catheter removed today and last voided at 1330.

## 2017-04-15 NOTE — ED Notes (Signed)
Pt alert & oriented x4, stable gait. Patient given discharge instructions, paperwork & prescription(s). Registration completed in the room.  Patient verbalized understanding. Pt left department w/ no further questions.

## 2017-04-15 NOTE — ED Provider Notes (Signed)
[facility] DEPT Provider Note   CSN: [id number] Arrival date & time: 04/15/17  2023     History   Chief Complaint Chief Complaint  Patient presents with  . Urinary Retention    HPI Raymond [name] is a 61 y.o. male.  The history is provided by the patient. No language interpreter was used.  Urinary Frequency  This is a recurrent problem. The current episode started 6 to 12 hours ago. The problem occurs constantly. The problem has not changed since onset.Pertinent negatives include no abdominal pain. Nothing aggravates the symptoms. Nothing relieves the symptoms. He has tried nothing for the symptoms.  Pt had foley removed at Urologist office today.  Pt complains of not being able to urination.    Past Medical History:  Diagnosis Date  . Anemia   . Arthritis    Hands and left knee  . Coronary artery disease   . Diabetes mellitus without complication ([place])   . High cholesterol   . Hypertension   . MI (myocardial infarction) ([place]) 2009    There are no active problems to display for this patient.   Past Surgical History:  Procedure Laterality Date  . APPENDECTOMY    . CARDIAC CATHETERIZATION    . COLONOSCOPY    . COLONOSCOPY N/A 04/01/2017   Procedure: COLONOSCOPY;  Surgeon: Daneil Dolin, MD;  Location: [facility] ENDO SUITE;  Service: Endoscopy;  Laterality: N/A;  9:45 AM  . CORONARY STENT PLACEMENT  2009  . CYSTOSCOPY W/ URETERAL STENT PLACEMENT Bilateral 04/11/2017   Procedure: CYSTOSCOPY WITH RETROGRADE PYELOGRAM;  Surgeon: Cleon Gustin, MD;  Location: [facility] ORS;  Service: Urology;  Laterality: Bilateral;  . FRACTURE SURGERY Right    Steel Rod in femur  . KNEE ARTHROSCOPY Left   . TONSILLECTOMY    . TRANSURETHRAL RESECTION OF BLADDER TUMOR N/A 04/11/2017   Procedure: TRANSURETHRAL RESECTION OF BLADDER TUMOR (TURBT);  Surgeon: Cleon Gustin, MD;  Location: [facility] ORS;  Service: Urology;  Laterality: N/A;  1 HR [phone] BLUE MEDICARE-[id number]        Home Medications    Prior to Admission medications   Medication Sig Start Date End Date Taking? Authorizing Provider  aspirin 325 MG tablet Take 325 mg by mouth at bedtime.     [provider]  celecoxib (CELEBREX) 200 MG capsule Take 200 mg by mouth daily as needed (pain).     [provider]  Choline Fenofibrate (FENOFIBRIC ACID) 135 MG CPDR Take 135 mg by mouth at bedtime.     [provider]  CINNAMON PO Take 1 tablet by mouth at bedtime.    [provider]  clopidogrel (PLAVIX) 75 MG tablet TAKE 1 TABLET DAILY Patient taking differently: TAKE 1 TABLET AT BEDTIME 02/23/13   Lorretta Harp, MD  COENZYME Q10 PO Take 1 capsule by mouth at bedtime.    [provider]  ezetimibe (ZETIA) 10 MG tablet Take 10 mg by mouth at bedtime.     [provider]  insulin lispro protamine-lispro (HUMALOG 75/25 MIX) (75-25) 100 UNIT/ML SUSP injection Inject 50 Units into the skin 2 (two) times daily with a meal.    [provider]  metFORMIN (GLUCOPHAGE) 500 MG tablet Take 1,000 mg by mouth 2 (two) times daily with a meal. 03/09/17   [provider]  metoprolol succinate (TOPROL-XL) 25 MG 24 hr tablet Take 25 mg by mouth at bedtime. 02/08/17   [provider]  Multiple Vitamin (MULTIVITAMIN) tablet Take 1  tablet by mouth daily.    [provider]  Omega-3 Fatty Acids (FISH OIL) 1200 MG CAPS Take 1,200 mg by mouth at bedtime.     [provider]  oxyCODONE-acetaminophen (ROXICET) 5-325 MG tablet Take 1 tablet by mouth every 4 (four) hours as needed. 04/11/17 04/11/18  Cleon Gustin, MD  rosuvastatin (CRESTOR) 40 MG tablet Take 40 mg by mouth at bedtime.     [provider]  telmisartan (MICARDIS) 80 MG tablet Take 80 mg by mouth at bedtime.     [provider]  VICTOZA 18 MG/3ML SOPN Inject 1.8 mg into the skin daily. 03/03/17   [provider]    Family History Family  History  Problem Relation Age of Onset  . Diabetes Mother   . Heart attack Mother   . Cancer Father        lung  . Stroke Paternal Grandfather   . Colon cancer Paternal Uncle     Social History Social History  Substance Use Topics  . Smoking status: Never Smoker  . Smokeless tobacco: Never Used  . Alcohol use No     Allergies   Patient has no known allergies.   Review of Systems Review of Systems  Gastrointestinal: Negative for abdominal pain.  Genitourinary: Positive for frequency.  All other systems reviewed and are negative.    Physical Exam Updated Vital Signs BP (!) 159/88   Pulse 71   Temp 97.9 F (36.6 C)   Resp (!) 22   Ht 6\' 1"  (1.854 m)   Wt 93.4 kg (206 lb)   SpO2 100%   BMI 27.18 kg/m   Physical Exam  Constitutional: He is oriented to person, place, and time. He appears well-developed and well-nourished.  HENT:  Head: Normocephalic.  Cardiovascular: Normal rate and regular rhythm.   Pulmonary/Chest: Effort normal.  Musculoskeletal: Normal range of motion.  Neurological: He is alert and oriented to person, place, and time.  Skin: Skin is warm.  Psychiatric: He has a normal mood and affect.  Nursing note and vitals reviewed.    ED Treatments / Results  Labs (all labs ordered are listed, but only abnormal results are displayed) Labs Reviewed  URINE CULTURE  URINALYSIS, ROUTINE W REFLEX MICROSCOPIC    EKG  EKG Interpretation None       Radiology No results found.  Procedures Procedures (including critical care time)  Medications Ordered in ED Medications - No data to display   Initial Impression / Assessment and Plan / ED Course  I have reviewed the triage vital signs and the nursing notes.  Pertinent labs & imaging results that were available during my care of the patient were reviewed by me and considered in my medical decision making (see chart for details).     Foley replaced by RN 1200 cc of urine drained.  Foley  left in.  Pt advised to call Urologist on Monday to let them know he had to have foley replaced.  Final Clinical Impressions(s) / ED Diagnoses   Final diagnoses:  Urinary retention    New Prescriptions New Prescriptions   No medications on file  An After Visit Summary was printed and given to the patient.    Sidney Ace 04/15/17 2118    Davonna Belling, MD 04/16/17 [phone]

## 2017-04-17 LAB — URINE CULTURE: Culture: NO GROWTH

## 2017-04-18 ENCOUNTER — Other Ambulatory Visit: Payer: Self-pay | Admitting: Urology

## 2017-04-20 ENCOUNTER — Ambulatory Visit (INDEPENDENT_AMBULATORY_CARE_PROVIDER_SITE_OTHER): Payer: Federal, State, Local not specified - PPO | Admitting: Urology

## 2017-04-20 DIAGNOSIS — C672 Malignant neoplasm of lateral wall of bladder: Secondary | ICD-10-CM | POA: Diagnosis not present

## 2017-05-05 ENCOUNTER — Telehealth (HOSPITAL_COMMUNITY): Payer: Self-pay

## 2017-05-05 NOTE — Telephone Encounter (Signed)
Encounter complete. 

## 2017-05-06 ENCOUNTER — Telehealth (HOSPITAL_COMMUNITY): Payer: Self-pay

## 2017-05-06 NOTE — Telephone Encounter (Signed)
Awaiting return call at this time.  Encounter complete. 

## 2017-05-09 NOTE — Patient Instructions (Signed)
Raymond [name]  05/09/2017     @PREFPERIOPPHARMACY @   Your procedure is scheduled on   05/16/2017   Report to [facility] at  615  A.M.  Call this number if you have problems the morning of surgery:  [phone]   Remember:  Do not eat food or drink liquids after midnight.  Take these medicines the morning of surgery with A SIP OF WATER  Celebrex, oxycodone. Take 1/2 of your usual night time insulin dose. DO NOT take ant medications for diabetes the morning of your surgery.   Do not wear jewelry, make-up or nail polish.  Do not wear lotions, powders, or perfumes, or deoderant.  Do not shave 48 hours prior to surgery.  Men may shave face and neck.  Do not bring valuables to the hospital.  [facility] is not responsible for any belongings or valuables.  Contacts, dentures or bridgework may not be worn into surgery.  Leave your suitcase in the car.  After surgery it may be brought to your room.  For patients admitted to the hospital, discharge time will be determined by your treatment team.  Patients discharged the day of surgery will not be allowed to drive home.   Name and phone number of your driver:   family Special instructions:  None  Please read over the following fact sheets that you were given. Anesthesia Post-op Instructions and Care and Recovery After Surgery       Transurethral Resection of Bladder Tumor Transurethral resection of a bladder tumor is the removal (resection) of a cancerous growth (tumor) on the inside wall of the bladder. The bladder is the organ that holds urine. The tumor is removed through the tube that carries urine out of the body (urethra). In a transurethral resection, a thin telescope with a light, a tiny camera, and an electric cutting edge (resectoscope) is passed through the urethra. In men, the opening of the urethra is at the end of the penis. In women, it is just above the vaginal opening. Tell a health care provider  about:  Any allergies you have.  All medicines you are taking, including vitamins, herbs, eye drops, creams, and over-the-counter medicines.  Any problems you or family members have had with anesthetic medicines.  Any blood disorders you have.  Any surgeries you have had.  Any medical conditions you have.  Any recent urinary tract infections you have had.  Whether you are pregnant or may be pregnant. What are the risks? Generally, this is a safe procedure. However, problems may occur, including:  Infection.  Bleeding.  Allergic reactions to medicines.  Damage to other structures or organs, such as: ? The urethra. ? The tubes that drain urine from the kidneys into the bladder (ureters).  Pain and burning during urination.  Difficulty urinating due to partial blockage of the urethra.  Inability to urinate (urinary retention).  What happens before the procedure?  Follow instructions from your health care provider about eating and drinking restrictions.  Ask your health care provider about: ? Changing or stopping your regular medicines. This is especially important if you are taking diabetes medicines or blood thinners. ? Taking medicines such as aspirin and ibuprofen. These medicines can thin your blood. Do not take these medicines before your procedure if your health care provider instructs you not to.  You may have a physical exam.  You may have tests, including: ? Blood  tests. ? Urine tests. ? Electrocardiogram (ECG). This test measures the electrical activity of the heart.  You may be given antibiotic medicine to help prevent infection.  Ask your health care provider how your surgical site will be marked or identified.  Plan to have someone take you home after the procedure. What happens during the procedure?  To reduce your risk of infection: ? Your health care team will wash or sanitize their hands. ? Your skin will be washed with soap.  An IV tube  will be inserted into one of your veins.  You will be given one or more of the following: ? A medicine to help you relax (sedative). ? A medicine to make you fall asleep (general anesthetic). ? A medicine that is injected into your spine to numb the area below and slightly above the injection site (spinal anesthetic).  Your legs will be placed in foot rests (stirrups) so that your legs are apart and your knees are bent.  The resectoscope will be passed through your urethra and into your bladder.  The part of your bladder that is affected by the tumor will be resected using the cutting edge of the resectoscope.  The resectoscope will be removed.  A thin, flexible tube (catheter) will be passed through your urethra and into your bladder. The catheter will drain urine into a bag outside of your body. ? Fluid may be passed through the catheter to keep the catheter open. The procedure may vary among health care providers and hospitals. What happens after the procedure?  Your blood pressure, heart rate, breathing rate, and blood oxygen level will be monitored often until the medicines you were given have worn off.  You may continue to receive fluids and medicines through an IV tube.  You will have some pain. Pain medicine will be available to help you.  You will have a catheter draining your urine. ? You will have blood in your urine. Your catheter may be kept in until your urine is clear. ? Your urinary drainage will be monitored. If necessary, your bladder may be rinsed out (irrigated) by passing fluid through your catheter.  You will be encouraged to walk around as soon as possible.  You may have to wear compression stockings. These stockings help to prevent blood clots and reduce swelling in your legs.  Do not drive for 24 hours if you received a sedative. This information is not intended to replace advice given to you by your health care provider. Make sure you discuss any questions  you have with your health care provider. Document Released: 07/10/2009 Document Revised: 05/16/2016 Document Reviewed: 06/05/2015 Elsevier Interactive Patient Education  2018 [place].  Transurethral Resection of Bladder Tumor, Care After Refer to this sheet in the next few weeks. These instructions provide you with information about caring for yourself after your procedure. Your health care provider may also give you more specific instructions. Your treatment has been planned according to current medical practices, but problems sometimes occur. Call your health care provider if you have any problems or questions after your procedure. What can I expect after the procedure? After the procedure, it is common to have:  A small amount of blood in your urine for up to 2 weeks.  Soreness or mild discomfort from your catheter. After your catheter is removed, you may have mild soreness, especially when urinating.  Pain in your lower abdomen.  Follow these instructions at home:  Medicines  Take over-the-counter and prescription medicines  only as told by your health care provider.  Do not drive or operate heavy machinery while taking prescription pain medicine.  Do not drive for 24 hours if you received a sedative.  If you were prescribed antibiotic medicine, take it as told by your health care provider. Do not stop taking the antibiotic even if you start to feel better. Activity  Return to your normal activities as told by your health care provider. Ask your health care provider what activities are safe for you.  Do not lift anything that is heavier than 10 lb (4.5 kg) for as long as told by your health care provider.  Avoid intense physical activity for as long as told by your health care provider.  Walk at least one time every day. This helps to prevent blood clots. You may increase your physical activity gradually as you start to feel better. General instructions  Do not drink  alcohol for as long as told by your health care provider. This is especially important if you are taking prescription pain medicines.  Do not take baths, swim, or use a hot tub until your health care provider approves.  If you have a catheter, follow instructions from your health care provider about caring for your catheter and your drainage bag.  Drink enough fluid to keep your urine clear or pale yellow.  Wear compression stockings as told by your health care provider. These stockings help to prevent blood clots and reduce swelling in your legs.  Keep all follow-up visits as told by your health care provider. This is important. Contact a health care provider if:  You have pain that gets worse or does not improve with medicine.  You have blood in your urine for more than 2 weeks.  You have cloudy or bad-smelling urine.  You become constipated. Signs of constipation may include having: ? Fewer than three bowel movements in a week. ? Difficulty having a bowel movement. ? Stools that are dry, hard, or larger than normal.  You have a fever. Get help right away if:  You have: ? Severe pain. ? Bright-red blood in your urine. ? Blood clots in your urine. ? A lot of blood in your urine.  Your catheter has been removed and you are not able to urinate.  You have a catheter in place and the catheter is not draining urine. This information is not intended to replace advice given to you by your health care provider. Make sure you discuss any questions you have with your health care provider. Document Released: 08/25/2015 Document Revised: 05/16/2016 Document Reviewed: 06/05/2015 Elsevier Interactive Patient Education  2018 [employer].  Cystoscopy Cystoscopy is a procedure that is used to help diagnose and sometimes treat conditions that affect that lower urinary tract. The lower urinary tract includes the bladder and the tube that drains urine from the bladder out of the body  (urethra). Cystoscopy is performed with a thin, tube-shaped instrument with a light and camera at the end (cystoscope). The cystoscope may be hard (rigid) or flexible, depending on the goal of the procedure.The cystoscope is inserted through the urethra, into the bladder. Cystoscopy may be recommended if you have:  Urinary tractinfections that keep coming back (recurring).  Blood in the urine (hematuria).  Loss of bladder control (urinary incontinence) or an overactive bladder.  Unusual cells found in a urine sample.  A blockage in the urethra.  Painful urination.  An abnormality in the bladder found during an intravenous pyelogram (IVP) or  CT scan.  Cystoscopy may also be done to remove a sample of tissue to be examined under a microscope (biopsy). Tell a health care provider about:  Any allergies you have.  All medicines you are taking, including vitamins, herbs, eye drops, creams, and over-the-counter medicines.  Any problems you or family members have had with anesthetic medicines.  Any blood disorders you have.  Any surgeries you have had.  Any medical conditions you have.  Whether you are pregnant or may be pregnant. What are the risks? Generally, this is a safe procedure. However, problems may occur, including:  Infection.  Bleeding.  Allergic reactions to medicines.  Damage to other structures or organs.  What happens before the procedure?  Ask your health care provider about: ? Changing or stopping your regular medicines. This is especially important if you are taking diabetes medicines or blood thinners. ? Taking medicines such as aspirin and ibuprofen. These medicines can thin your blood. Do not take these medicines before your procedure if your health care provider instructs you not to.  Follow instructions from your health care provider about eating or drinking restrictions.  You may be given antibiotic medicine to help prevent infection.  You may  have an exam or testing, such as X-rays of the bladder, urethra, or kidneys.  You may have urine tests to check for signs of infection.  Plan to have someone take you home after the procedure. What happens during the procedure?  To reduce your risk of infection,your health care team will wash or sanitize their hands.  You will be given one or more of the following: ? A medicine to help you relax (sedative). ? A medicine to numb the area (local anesthetic).  The area around the opening of your urethra will be cleaned.  The cystoscope will be passed through your urethra into your bladder.  Germ-free (sterile)fluid will flow through the cystoscope to fill your bladder. The fluid will stretch your bladder so that your surgeon can clearly examine your bladder walls.  The cystoscope will be removed and your bladder will be emptied. The procedure may vary among health care providers and hospitals. What happens after the procedure?  You may have some soreness or pain in your abdomen and urethra. Medicines will be available to help you.  You may have some blood in your urine.  Do not drive for 24 hours if you received a sedative. This information is not intended to replace advice given to you by your health care provider. Make sure you discuss any questions you have with your health care provider. Document Released: 09/10/2000 Document Revised: 01/22/2016 Document Reviewed: 07/31/2015 Elsevier Interactive Patient Education  2017 [place].  Cystoscopy, Care After Refer to this sheet in the next few weeks. These instructions provide you with information about caring for yourself after your procedure. Your health care provider may also give you more specific instructions. Your treatment has been planned according to current medical practices, but problems sometimes occur. Call your health care provider if you have any problems or questions after your procedure. What can I expect after  the procedure? After the procedure, it is common to have:  Mild pain when you urinate. Pain should stop within a few minutes after you urinate. This may last for up to 1 week.  A small amount of blood in your urine for several days.  Feeling like you need to urinate but producing only a small amount of urine.  Follow these instructions at home:  Medicines  Take over-the-counter and prescription medicines only as told by your health care provider.  If you were prescribed an antibiotic medicine, take it as told by your health care provider. Do not stop taking the antibiotic even if you start to feel better. General instructions   Return to your normal activities as told by your health care provider. Ask your health care provider what activities are safe for you.  Do not drive for 24 hours if you received a sedative.  Watch for any blood in your urine. If the amount of blood in your urine increases, call your health care provider.  Follow instructions from your health care provider about eating or drinking restrictions.  If a tissue sample was removed for testing (biopsy) during your procedure, it is your responsibility to get your test results. Ask your health care provider or the department performing the test when your results will be ready.  Drink enough fluid to keep your urine clear or pale yellow.  Keep all follow-up visits as told by your health care provider. This is important. Contact a health care provider if:  You have pain that gets worse or does not get better with medicine, especially pain when you urinate.  You have difficulty urinating. Get help right away if:  You have more blood in your urine.  You have blood clots in your urine.  You have abdominal pain.  You have a fever or chills.  You are unable to urinate. This information is not intended to replace advice given to you by your health care provider. Make sure you discuss any questions you have with  your health care provider. Document Released: 04/02/2005 Document Revised: 02/19/2016 Document Reviewed: 07/31/2015 Elsevier Interactive Patient Education  2017 [place] Anesthesia, Adult General anesthesia is the use of medicines to make a person "go to sleep" (be unconscious) for a medical procedure. General anesthesia is often recommended when a procedure:  Is long.  Requires you to be still or in an unusual position.  Is major and can cause you to lose blood.  Is impossible to do without general anesthesia.  The medicines used for general anesthesia are called general anesthetics. In addition to making you sleep, the medicines:  Prevent pain.  Control your blood pressure.  Relax your muscles.  Tell a health care provider about:  Any allergies you have.  All medicines you are taking, including vitamins, herbs, eye drops, creams, and over-the-counter medicines.  Any problems you or family members have had with anesthetic medicines.  Types of anesthetics you have had in the past.  Any bleeding disorders you have.  Any surgeries you have had.  Any medical conditions you have.  Any history of heart or lung conditions, such as heart failure, sleep apnea, or chronic obstructive pulmonary disease (COPD).  Whether you are pregnant or may be pregnant.  Whether you use tobacco, alcohol, marijuana, or street drugs.  Any history of Armed forces logistics/support/administrative officer.  Any history of depression or anxiety. What are the risks? Generally, this is a safe procedure. However, problems may occur, including:  Allergic reaction to anesthetics.  Lung and heart problems.  Inhaling food or liquids from your stomach into your lungs (aspiration).  Injury to nerves.  Waking up during your procedure and being unable to move (rare).  Extreme agitation or a state of mental confusion (delirium) when you wake up from the anesthetic.  Air in the bloodstream, which can lead to  stroke.  These problems are  more likely to develop if you are having a major surgery or if you have an advanced medical condition. You can prevent some of these complications by answering all of your health care provider's questions thoroughly and by following all pre-procedure instructions. General anesthesia can cause side effects, including:  Nausea or vomiting  A sore throat from the breathing tube.  Feeling cold or shivery.  Feeling tired, washed out, or achy.  Sleepiness or drowsiness.  Confusion or agitation.  What happens before the procedure? Staying hydrated Follow instructions from your health care provider about hydration, which may include:  Up to 2 hours before the procedure - you may continue to drink clear liquids, such as water, clear fruit juice, black coffee, and plain tea.  Eating and drinking restrictions Follow instructions from your health care provider about eating and drinking, which may include:  8 hours before the procedure - stop eating heavy meals or foods such as meat, fried foods, or fatty foods.  6 hours before the procedure - stop eating light meals or foods, such as toast or cereal.  6 hours before the procedure - stop drinking milk or drinks that contain milk.  2 hours before the procedure - stop drinking clear liquids.  Medicines  Ask your health care provider about: ? Changing or stopping your regular medicines. This is especially important if you are taking diabetes medicines or blood thinners. ? Taking medicines such as aspirin and ibuprofen. These medicines can thin your blood. Do not take these medicines before your procedure if your health care provider instructs you not to. ? Taking new dietary supplements or medicines. Do not take these during the week before your procedure unless your health care provider approves them.  If you are told to take a medicine or to continue taking a medicine on the day of the procedure, take the  medicine with sips of water. General instructions   Ask if you will be going home the same day, the following day, or after a longer hospital stay. ? Plan to have someone take you home. ? Plan to have someone stay with you for the first 24 hours after you leave the hospital or clinic.  For 3-6 weeks before the procedure, try not to use any tobacco products, such as cigarettes, chewing tobacco, and e-cigarettes.  You may brush your teeth on the morning of the procedure, but make sure to spit out the toothpaste. What happens during the procedure?  You will be given anesthetics through a mask and through an IV tube in one of your veins.  You may receive medicine to help you relax (sedative).  As soon as you are asleep, a breathing tube may be used to help you breathe.  An anesthesia specialist will stay with you throughout the procedure. He or she will help keep you comfortable and safe by continuing to give you medicines and adjusting the amount of medicine that you get. He or she will also watch your blood pressure, pulse, and oxygen levels to make sure that the anesthetics do not cause any problems.  If a breathing tube was used to help you breathe, it will be removed before you wake up. The procedure may vary among health care providers and hospitals. What happens after the procedure?  You will wake up, often slowly, after the procedure is complete, usually in a recovery area.  Your blood pressure, heart rate, breathing rate, and blood oxygen level will be monitored until the medicines you were given  have worn off.  You may be given medicine to help you calm down if you feel anxious or agitated.  If you will be going home the same day, your health care provider may check to make sure you can stand, drink, and urinate.  Your health care providers will treat your pain and side effects before you go home.  Do not drive for 24 hours if you received a sedative.  You may: ? Feel  nauseous and vomit. ? Have a sore throat. ? Have mental slowness. ? Feel cold or shivery. ? Feel sleepy. ? Feel tired. ? Feel sore or achy, even in parts of your body where you did not have surgery. This information is not intended to replace advice given to you by your health care provider. Make sure you discuss any questions you have with your health care provider. Document Released: 12/21/2007 Document Revised: 02/24/2016 Document Reviewed: 08/28/2015 Elsevier Interactive Patient Education  2018 [place] Anesthesia, Adult, Care After These instructions provide you with information about caring for yourself after your procedure. Your health care provider may also give you more specific instructions. Your treatment has been planned according to current medical practices, but problems sometimes occur. Call your health care provider if you have any problems or questions after your procedure. What can I expect after the procedure? After the procedure, it is common to have:  Vomiting.  A sore throat.  Mental slowness.  It is common to feel:  Nauseous.  Cold or shivery.  Sleepy.  Tired.  Sore or achy, even in parts of your body where you did not have surgery.  Follow these instructions at home: For at least 24 hours after the procedure:  Do not: ? Participate in activities where you could fall or become injured. ? Drive. ? Use heavy machinery. ? Drink alcohol. ? Take sleeping pills or medicines that cause drowsiness. ? Make important decisions or sign legal documents. ? Take care of children on your own.  Rest. Eating and drinking  If you vomit, drink water, juice, or soup when you can drink without vomiting.  Drink enough fluid to keep your urine clear or pale yellow.  Make sure you have little or no nausea before eating solid foods.  Follow the diet recommended by your health care provider. General instructions  Have a responsible adult stay with  you until you are awake and alert.  Return to your normal activities as told by your health care provider. Ask your health care provider what activities are safe for you.  Take over-the-counter and prescription medicines only as told by your health care provider.  If you smoke, do not smoke without supervision.  Keep all follow-up visits as told by your health care provider. This is important. Contact a health care provider if:  You continue to have nausea or vomiting at home, and medicines are not helpful.  You cannot drink fluids or start eating again.  You cannot urinate after 8-12 hours.  You develop a skin rash.  You have fever.  You have increasing redness at the site of your procedure. Get help right away if:  You have difficulty breathing.  You have chest pain.  You have unexpected bleeding.  You feel that you are having a life-threatening or urgent problem. This information is not intended to replace advice given to you by your health care provider. Make sure you discuss any questions you have with your health care provider. Document Released: 12/20/2000 Document Revised: 02/16/2016  Document Reviewed: 08/28/2015 Elsevier Interactive Patient Education  [employer].

## 2017-05-10 ENCOUNTER — Encounter (HOSPITAL_COMMUNITY): Payer: Self-pay

## 2017-05-10 ENCOUNTER — Other Ambulatory Visit: Payer: Self-pay | Admitting: Cardiovascular Disease

## 2017-05-10 ENCOUNTER — Ambulatory Visit (HOSPITAL_COMMUNITY)
Admission: RE | Admit: 2017-05-10 | Discharge: 2017-05-10 | Disposition: A | Discharge status: Home / Self Care | Payer: Federal, State, Local not specified - PPO | Source: Ambulatory Visit | Attending: Cardiovascular Disease | Admitting: Cardiovascular Disease

## 2017-05-10 DIAGNOSIS — I2510 Atherosclerotic heart disease of native coronary artery without angina pectoris: Secondary | ICD-10-CM

## 2017-05-10 DIAGNOSIS — Z01818 Encounter for other preprocedural examination: Secondary | ICD-10-CM

## 2017-05-11 ENCOUNTER — Encounter (HOSPITAL_COMMUNITY): Payer: Self-pay

## 2017-05-11 ENCOUNTER — Ambulatory Visit (HOSPITAL_COMMUNITY)
Admission: RE | Admit: 2017-05-11 | Discharge: 2017-05-11 | Disposition: A | Discharge status: Home / Self Care | Payer: Federal, State, Local not specified - PPO | Source: Ambulatory Visit | Attending: Cardiovascular Disease | Admitting: Cardiovascular Disease

## 2017-05-11 ENCOUNTER — Encounter (HOSPITAL_COMMUNITY)
Admission: RE | Admit: 2017-05-11 | Discharge: 2017-05-11 | Disposition: A | Discharge status: Home / Self Care | Payer: Federal, State, Local not specified - PPO | Source: Ambulatory Visit | Attending: Urology | Admitting: Urology

## 2017-05-11 DIAGNOSIS — I2510 Atherosclerotic heart disease of native coronary artery without angina pectoris: Secondary | ICD-10-CM | POA: Diagnosis not present

## 2017-05-11 DIAGNOSIS — Z01818 Encounter for other preprocedural examination: Secondary | ICD-10-CM | POA: Insufficient documentation

## 2017-05-11 LAB — CBC
HCT: 33.6 % — ABNORMAL LOW (ref 39.0–52.0)
Hemoglobin: 11.8 g/dL — ABNORMAL LOW (ref 13.0–17.0)
MCH: 30.5 pg (ref 26.0–34.0)
MCHC: 35.1 g/dL (ref 30.0–36.0)
MCV: 86.8 fL (ref 78.0–100.0)
PLATELETS: 247 10*3/uL (ref 150–400)
RBC: 3.87 MIL/uL — ABNORMAL LOW (ref 4.22–5.81)
RDW: 13.2 % (ref 11.5–15.5)
WBC: 8.4 10*3/uL (ref 4.0–10.5)

## 2017-05-11 LAB — EXERCISE TOLERANCE TEST
Estimated workload: 10.2 METS
Exercise duration (min): 9 min
Exercise duration (sec): 7 s
MPHR: 158 {beats}/min
Peak HR: 144 {beats}/min
Percent HR: 91 %
RPE: 18
Rest HR: 53 {beats}/min

## 2017-05-11 LAB — BASIC METABOLIC PANEL
Anion gap: 10 (ref 5–15)
BUN: 26 mg/dL — AB (ref 6–20)
CALCIUM: 8.9 mg/dL (ref 8.9–10.3)
CO2: 21 mmol/L — AB (ref 22–32)
Chloride: 106 mmol/L (ref 101–111)
Creatinine, Ser: 1.11 mg/dL (ref 0.61–1.24)
GFR calc Af Amer: >60 mL/min (ref >60)
GLUCOSE: 236 mg/dL — AB (ref 65–99)
Potassium: 4 mmol/L (ref 3.5–5.1)
Sodium: 137 mmol/L (ref 135–145)

## 2017-05-16 ENCOUNTER — Ambulatory Visit (HOSPITAL_COMMUNITY): Payer: Federal, State, Local not specified - PPO | Admitting: Anesthesiology

## 2017-05-16 ENCOUNTER — Ambulatory Visit (HOSPITAL_COMMUNITY)
Admission: RE | Admit: 2017-05-16 | Discharge: 2017-05-16 | Disposition: A | Discharge status: Home / Self Care | Payer: Federal, State, Local not specified - PPO | Source: Ambulatory Visit | Attending: Urology | Admitting: Urology

## 2017-05-16 ENCOUNTER — Encounter (HOSPITAL_COMMUNITY): Payer: Self-pay | Admitting: *Deleted

## 2017-05-16 ENCOUNTER — Encounter (HOSPITAL_COMMUNITY)
Admission: RE | Disposition: A | Discharge status: Home / Self Care | Payer: Self-pay | Source: Ambulatory Visit | Attending: Urology

## 2017-05-16 DIAGNOSIS — I252 Old myocardial infarction: Secondary | ICD-10-CM | POA: Diagnosis not present

## 2017-05-16 DIAGNOSIS — Z7982 Long term (current) use of aspirin: Secondary | ICD-10-CM | POA: Diagnosis not present

## 2017-05-16 DIAGNOSIS — Z79899 Other long term (current) drug therapy: Secondary | ICD-10-CM | POA: Diagnosis not present

## 2017-05-16 DIAGNOSIS — C679 Malignant neoplasm of bladder, unspecified: Secondary | ICD-10-CM | POA: Diagnosis not present

## 2017-05-16 DIAGNOSIS — D303 Benign neoplasm of bladder: Secondary | ICD-10-CM | POA: Diagnosis not present

## 2017-05-16 DIAGNOSIS — Z794 Long term (current) use of insulin: Secondary | ICD-10-CM | POA: Diagnosis not present

## 2017-05-16 DIAGNOSIS — I2510 Atherosclerotic heart disease of native coronary artery without angina pectoris: Secondary | ICD-10-CM | POA: Insufficient documentation

## 2017-05-16 DIAGNOSIS — E7800 Pure hypercholesterolemia, unspecified: Secondary | ICD-10-CM | POA: Insufficient documentation

## 2017-05-16 DIAGNOSIS — R310 Gross hematuria: Secondary | ICD-10-CM | POA: Diagnosis not present

## 2017-05-16 DIAGNOSIS — E119 Type 2 diabetes mellitus without complications: Secondary | ICD-10-CM | POA: Diagnosis not present

## 2017-05-16 DIAGNOSIS — D494 Neoplasm of unspecified behavior of bladder: Secondary | ICD-10-CM | POA: Diagnosis not present

## 2017-05-16 DIAGNOSIS — Z7902 Long term (current) use of antithrombotics/antiplatelets: Secondary | ICD-10-CM | POA: Diagnosis not present

## 2017-05-16 DIAGNOSIS — I10 Essential (primary) hypertension: Secondary | ICD-10-CM | POA: Diagnosis not present

## 2017-05-16 DIAGNOSIS — N3010 Interstitial cystitis (chronic) without hematuria: Secondary | ICD-10-CM | POA: Diagnosis not present

## 2017-05-16 DIAGNOSIS — Z955 Presence of coronary angioplasty implant and graft: Secondary | ICD-10-CM | POA: Insufficient documentation

## 2017-05-16 HISTORY — PX: TRANSURETHRAL RESECTION OF BLADDER TUMOR: SHX2575

## 2017-05-16 HISTORY — PX: CYSTOSCOPY: SHX5120

## 2017-05-16 LAB — GLUCOSE, CAPILLARY
GLUCOSE-CAPILLARY: 263 mg/dL — AB (ref 65–99)
Glucose-Capillary: 260 mg/dL — ABNORMAL HIGH (ref 65–99)
Glucose-Capillary: 248 mg/dL — ABNORMAL HIGH (ref 65–99)

## 2017-05-16 SURGERY — TURBT (TRANSURETHRAL RESECTION OF BLADDER TUMOR)
Anesthesia: General | Site: Urethra

## 2017-05-16 MED ORDER — PROPOFOL 10 MG/ML IV BOLUS
INTRAVENOUS | Status: AC
  Filled 2017-05-16: qty 40

## 2017-05-16 MED ORDER — STERILE WATER FOR IRRIGATION IR SOLN
Status: DC | PRN
  Administered 2017-05-16: 1000 mL

## 2017-05-16 MED ORDER — LACTATED RINGERS IV SOLN
INTRAVENOUS | Status: DC
  Administered 2017-05-16: 07:00:00 via INTRAVENOUS

## 2017-05-16 MED ORDER — SUCCINYLCHOLINE CHLORIDE 20 MG/ML IJ SOLN
INTRAMUSCULAR | Status: AC
  Filled 2017-05-16: qty 1

## 2017-05-16 MED ORDER — PROPOFOL 10 MG/ML IV BOLUS
INTRAVENOUS | Status: DC | PRN
  Administered 2017-05-16: 20 mg via INTRAVENOUS
  Administered 2017-05-16: 160 mg via INTRAVENOUS

## 2017-05-16 MED ORDER — INSULIN ASPART 100 UNIT/ML ~~LOC~~ SOLN
4.0000 [IU] | Freq: Once | SUBCUTANEOUS | Status: AC
  Administered 2017-05-16: 4 [IU] via SUBCUTANEOUS
  Filled 2017-05-16: qty 0.04

## 2017-05-16 MED ORDER — CEFAZOLIN SODIUM-DEXTROSE 2-4 GM/100ML-% IV SOLN
2.0000 g | INTRAVENOUS | Status: AC
  Administered 2017-05-16: 2 g via INTRAVENOUS
  Filled 2017-05-16 (×2): qty 100

## 2017-05-16 MED ORDER — LIDOCAINE HCL (CARDIAC) 10 MG/ML IV SOLN
INTRAVENOUS | Status: DC | PRN
  Administered 2017-05-16: 40 mg via INTRAVENOUS

## 2017-05-16 MED ORDER — MIDAZOLAM HCL 2 MG/2ML IJ SOLN
INTRAMUSCULAR | Status: AC
  Filled 2017-05-16: qty 2

## 2017-05-16 MED ORDER — MIDAZOLAM HCL 2 MG/2ML IJ SOLN
1.0000 mg | Freq: Once | INTRAMUSCULAR | Status: AC | PRN
  Administered 2017-05-16: 2 mg via INTRAVENOUS

## 2017-05-16 MED ORDER — OXYCODONE HCL 5 MG/5ML PO SOLN: 5.0000 mg | Freq: Once | ORAL | Status: DC | PRN

## 2017-05-16 MED ORDER — FENTANYL CITRATE (PF) 100 MCG/2ML IJ SOLN
INTRAMUSCULAR | Status: DC | PRN
  Administered 2017-05-16 (×2): 50 ug via INTRAVENOUS

## 2017-05-16 MED ORDER — FENTANYL CITRATE (PF) 100 MCG/2ML IJ SOLN: 25.0000 ug | INTRAMUSCULAR | Status: DC | PRN

## 2017-05-16 MED ORDER — GLYCOPYRROLATE 0.2 MG/ML IJ SOLN
0.1000 mg | Freq: Once | INTRAMUSCULAR | Status: AC
  Administered 2017-05-16: 0.1 mg via INTRAVENOUS

## 2017-05-16 MED ORDER — GLYCOPYRROLATE 0.2 MG/ML IJ SOLN
INTRAMUSCULAR | Status: AC
  Filled 2017-05-16: qty 1

## 2017-05-16 MED ORDER — OXYCODONE HCL 5 MG PO TABS: 5.0000 mg | ORAL_TABLET | Freq: Once | ORAL | Status: DC | PRN

## 2017-05-16 MED ORDER — OXYCODONE-ACETAMINOPHEN 5-325 MG PO TABS: 1.0000 | ORAL_TABLET | ORAL | 0 refills | Status: DC | PRN

## 2017-05-16 MED ORDER — LIDOCAINE HCL (PF) 1 % IJ SOLN
INTRAMUSCULAR | Status: AC
  Filled 2017-05-16: qty 5

## 2017-05-16 MED ORDER — FENTANYL CITRATE (PF) 250 MCG/5ML IJ SOLN
INTRAMUSCULAR | Status: AC
  Filled 2017-05-16: qty 5

## 2017-05-16 MED ORDER — SODIUM CHLORIDE 0.9 % IR SOLN
Status: DC | PRN
  Administered 2017-05-16 (×2): 3000 mL via INTRAVESICAL

## 2017-05-16 SURGICAL SUPPLY — 24 items
BAG DRAIN URO TABLE W/ADPT NS (DRAPE) ×4 IMPLANT
BAG DRN 8 ADPR NS SKTRN CSTL (DRAPE) ×2
BAG HAMPER (MISCELLANEOUS) ×4 IMPLANT
BAG URINE DRAINAGE (UROLOGICAL SUPPLIES) ×2 IMPLANT
CATH FOLEY 3WAY 30CC 22F (CATHETERS) ×2 IMPLANT
CLOTH BEACON ORANGE TIMEOUT ST (SAFETY) ×4 IMPLANT
ELECT LOOP 22F BIPOLAR SML (ELECTROSURGICAL) ×4
ELECT REM PT RETURN 9FT ADLT (ELECTROSURGICAL) ×4
ELECTRODE LOOP 22F BIPOLAR SML (ELECTROSURGICAL) ×2 IMPLANT
ELECTRODE REM PT RTRN 9FT ADLT (ELECTROSURGICAL) ×2 IMPLANT
FORMALIN 10 PREFIL 120ML (MISCELLANEOUS) ×4 IMPLANT
GLOVE BIO SURGEON STRL SZ8 (GLOVE) ×4 IMPLANT
GLOVE BIOGEL PI IND STRL 7.0 (GLOVE) ×2 IMPLANT
GLOVE BIOGEL PI INDICATOR 7.0 (GLOVE) ×6
GOWN STRL REUS W/ TWL LRG LVL3 (GOWN DISPOSABLE) ×2 IMPLANT
GOWN STRL REUS W/TWL LRG LVL3 (GOWN DISPOSABLE) ×4
GOWN STRL REUS W/TWL XL LVL3 (GOWN DISPOSABLE) ×4 IMPLANT
IV NS IRRIG 3000ML ARTHROMATIC (IV SOLUTION) ×8 IMPLANT
KIT ROOM TURNOVER AP CYSTO (KITS) ×4 IMPLANT
PACK CYSTO (CUSTOM PROCEDURE TRAY) ×4 IMPLANT
PAD ARMBOARD 7.5X6 YLW CONV (MISCELLANEOUS) ×4 IMPLANT
PLUG CATH AND CAP STER (CATHETERS) ×2 IMPLANT
TOWEL OR 17X26 4PK STRL BLUE (TOWEL DISPOSABLE) ×4 IMPLANT
WATER STERILE IRR 1000ML POUR (IV SOLUTION) ×2 IMPLANT

## 2017-05-16 NOTE — Op Note (Signed)
.  Preoperative diagnosis: bladder tumor  Postoperative diagnosis: Same  Procedure: 1 cystoscopy 2. Transurethral resection of bladder tumor, medium  Attending: Rosie Fate  Anesthesia: General  Estimated blood loss: Minimal  Drains: 22 French foley  Specimens: left lateral wall bladder tumor  Antibiotics: ancef  Findings:  3cm sessile left lateral wall tumor.  Ureteral orifices in normal anatomic location.   Indications: Raymond [name] is a 61 year old male with a history of bladder cancer and gross hematuria.  After discussing treatment options, they decided proceed with transurethral resection of a bladder tumor.  Procedure her in detail: The Raymond [name] was brought to the operating room and a brief timeout was done to ensure correct Raymond [name], correct procedure, correct site.  General anesthesia was administered Raymond [name] was placed in dorsal lithotomy position.  Their genitalia was then prepped and draped in usual sterile fashion.  A rigid 34 French cystoscope was passed in the urethra and the bladder.  Bladder was inspected and we noted a 3cm sessile left lateral wall bladder tumor.  the ureteral orifices were in the normal orthotopic locations.  Using the bipolar resectoscope we removed the bladder tumor down to the base. A subsequent muscle deep biopsy was then taken. Hemostasis was then obtained with electrocautery. We then removed the bladder tumor chips and sent them for pathology. We then re-inspected the bladder and found no residula bleeding.  the bladder was then drained, a 22 French foley was placed and this concluded the procedure which was well tolerated by Raymond [name].  Complications: None  Condition: Stable, extubated, transferred to PACU  Plan: Raymond [name] is to be discharged home and followup in 5 days for foley catheter removal and pathology discussion.

## 2017-05-16 NOTE — Discharge Instructions (Signed)
Transurethral Resection of Bladder Tumor, Care After Refer to this sheet in the next few weeks. These instructions provide you with information about caring for yourself after your procedure. Your health care provider may also give you more specific instructions. Your treatment has been planned according to current medical practices, but problems sometimes occur. Call your health care provider if you have any problems or questions after your procedure. What can I expect after the procedure? After the procedure, it is common to have:  A small amount of blood in your urine for up to 2 weeks.  Soreness or mild discomfort from your catheter. After your catheter is removed, you may have mild soreness, especially when urinating.  Pain in your lower abdomen.  Follow these instructions at home:  Medicines  Take over-the-counter and prescription medicines only as told by your health care provider.  Do not drive or operate heavy machinery while taking prescription pain medicine.  Do not drive for 24 hours if you received a sedative.  If you were prescribed antibiotic medicine, take it as told by your health care provider. Do not stop taking the antibiotic even if you start to feel better. Activity  Return to your normal activities as told by your health care provider. Ask your health care provider what activities are safe for you.  Do not lift anything that is heavier than 10 lb (4.5 kg) for as long as told by your health care provider.  Avoid intense physical activity for as long as told by your health care provider.  Walk at least one time every day. This helps to prevent blood clots. You may increase your physical activity gradually as you start to feel better. General instructions  Do not drink alcohol for as long as told by your health care provider. This is especially important if you are taking prescription pain medicines.  Do not take baths, swim, or use a hot tub until your health  care provider approves.  If you have a catheter, follow instructions from your health care provider about caring for your catheter and your drainage bag.  Drink enough fluid to keep your urine clear or pale yellow.  Wear compression stockings as told by your health care provider. These stockings help to prevent blood clots and reduce swelling in your legs.  Keep all follow-up visits as told by your health care provider. This is important. Contact a health care provider if:  You have pain that gets worse or does not improve with medicine.  You have blood in your urine for more than 2 weeks.  You have cloudy or bad-smelling urine.  You become constipated. Signs of constipation may include having: ? Fewer than three bowel movements in a week. ? Difficulty having a bowel movement. ? Stools that are dry, hard, or larger than normal.  You have a fever. Get help right away if:  You have: ? Severe pain. ? Bright-red blood in your urine. ? Blood clots in your urine. ? A lot of blood in your urine.  Your catheter has been removed and you are not able to urinate.  You have a catheter in place and the catheter is not draining urine. This information is not intended to replace advice given to you by your health care provider. Make sure you discuss any questions you have with your health care provider. Document Released: 08/25/2015 Document Revised: 05/16/2016 Document Reviewed: 06/05/2015 Elsevier Interactive Patient Education  2018 [place] Anesthesia, Adult, Care After These instructions provide  you with information about caring for yourself after your procedure. Your health care provider may also give you more specific instructions. Your treatment has been planned according to current medical practices, but problems sometimes occur. Call your health care provider if you have any problems or questions after your procedure. What can I expect after the procedure? After the  procedure, it is common to have:  Vomiting.  A sore throat.  Mental slowness.  It is common to feel:  Nauseous.  Cold or shivery.  Sleepy.  Tired.  Sore or achy, even in parts of your body where you did not have surgery.  Follow these instructions at home: For at least 24 hours after the procedure:  Do not: ? Participate in activities where you could fall or become injured. ? Drive. ? Use heavy machinery. ? Drink alcohol. ? Take sleeping pills or medicines that cause drowsiness. ? Make important decisions or sign legal documents. ? Take care of children on your own.  Rest. Eating and drinking  If you vomit, drink water, juice, or soup when you can drink without vomiting.  Drink enough fluid to keep your urine clear or pale yellow.  Make sure you have little or no nausea before eating solid foods.  Follow the diet recommended by your health care provider. General instructions  Have a responsible adult stay with you until you are awake and alert.  Return to your normal activities as told by your health care provider. Ask your health care provider what activities are safe for you.  Take over-the-counter and prescription medicines only as told by your health care provider.  If you smoke, do not smoke without supervision.  Keep all follow-up visits as told by your health care provider. This is important. Contact a health care provider if:  You continue to have nausea or vomiting at home, and medicines are not helpful.  You cannot drink fluids or start eating again.  You cannot urinate after 8-12 hours.  You develop a skin rash.  You have fever.  You have increasing redness at the site of your procedure. Get help right away if:  You have difficulty breathing.  You have chest pain.  You have unexpected bleeding.  You feel that you are having a life-threatening or urgent problem. This information is not intended to replace advice given to you by your  health care provider. Make sure you discuss any questions you have with your health care provider. Document Released: 12/20/2000 Document Revised: 02/16/2016 Document Reviewed: 08/28/2015 Elsevier Interactive Patient Education  2018 [employer]. Acetaminophen; Oxycodone tablets What is this medicine? ACETAMINOPHEN; OXYCODONE (a set a MEE noe fen; ox i KOE done) is a pain reliever. It is used to treat moderate to severe pain. This medicine may be used for other purposes; ask your health care provider or pharmacist if you have questions. COMMON BRAND NAME(S): Endocet, Magnacet, Narvox, Percocet, Perloxx, Primalev, Primlev, Roxicet, Xolox What should I tell my health care provider before I take this medicine? They need to know if you have any of these conditions: -brain tumor -Crohn's disease, inflammatory bowel disease, or ulcerative colitis -drug abuse or addiction -head injury -heart or circulation problems -if you often drink alcohol -kidney disease or problems going to the bathroom -liver disease -lung disease, asthma, or breathing problems -an unusual or allergic reaction to acetaminophen, oxycodone, other opioid analgesics, other medicines, foods, dyes, or preservatives -pregnant or trying to get pregnant -breast-feeding How should I use this medicine? Take this medicine  by mouth with a full glass of water. Follow the directions on the prescription label. You can take it with or without food. If it upsets your stomach, take it with food. Take your medicine at regular intervals. Do not take it more often than directed. A special MedGuide will be given to you by the pharmacist with each prescription and refill. Be sure to read this information carefully each time. Talk to your pediatrician regarding the use of this medicine in children. Special care may be needed. Overdosage: If you think you have taken too much of this medicine contact a poison control center or emergency room at  once. NOTE: This medicine is only for you. Do not share this medicine with others. What if I miss a dose? If you miss a dose, take it as soon as you can. If it is almost time for your next dose, take only that dose. Do not take double or extra doses. What may interact with this medicine? This medicine may interact with the following medications: -alcohol -antihistamines for allergy, cough and cold -antiviral medicines for HIV or AIDS -atropine -certain antibiotics like clarithromycin, erythromycin, linezolid, rifampin -certain medicines for anxiety or sleep -certain medicines for bladder problems like oxybutynin, tolterodine -certain medicines for depression like amitriptyline, fluoxetine, sertraline -certain medicines for fungal infections like ketoconazole, itraconazole, voriconazole -certain medicines for migraine headache like almotriptan, eletriptan, frovatriptan, naratriptan, rizatriptan, sumatriptan, zolmitriptan -certain medicines for nausea or vomiting like dolasetron, ondansetron, palonosetron -certain medicines for Parkinson's disease like benztropine, trihexyphenidyl -certain medicines for seizures like phenobarbital, phenytoin, primidone -certain medicines for stomach problems like dicyclomine, hyoscyamine -certain medicines for travel sickness like scopolamine -diuretics -general anesthetics like halothane, isoflurane, methoxyflurane, propofol -ipratropium -local anesthetics like lidocaine, pramoxine, tetracaine -MAOIs like Carbex, Eldepryl, Marplan, Nardil, and Parnate -medicines that relax muscles for surgery -methylene blue -nilotinib -other medicines with acetaminophen -other narcotic medicines for pain or cough -phenothiazines like chlorpromazine, mesoridazine, prochlorperazine, thioridazine This list may not describe all possible interactions. Give your health care provider a list of all the medicines, herbs, non-prescription drugs, or dietary supplements you use.  Also tell them if you smoke, drink alcohol, or use illegal drugs. Some items may interact with your medicine. What should I watch for while using this medicine? Tell your doctor or health care professional if your pain does not go away, if it gets worse, or if you have new or a different type of pain. You may develop tolerance to the medicine. Tolerance means that you will need a higher dose of the medication for pain relief. Tolerance is normal and is expected if you take this medicine for a long time. Do not suddenly stop taking your medicine because you may develop a severe reaction. Your body becomes used to the medicine. This does NOT mean you are addicted. Addiction is a behavior related to getting and using a drug for a non-medical reason. If you have pain, you have a medical reason to take pain medicine. Your doctor will tell you how much medicine to take. If your doctor wants you to stop the medicine, the dose will be slowly lowered over time to avoid any side effects. There are different types of narcotic medicines (opiates). If you take more than one type at the same time or if you are taking another medicine that also causes drowsiness, you may have more side effects. Give your health care provider a list of all medicines you use. Your doctor will tell you how much medicine to take.  Do not take more medicine than directed. Call emergency for help if you have problems breathing or unusual sleepiness. Do not take other medicines that contain acetaminophen with this medicine. Always read labels carefully. If you have questions, ask your doctor or pharmacist. If you take too much acetaminophen get medical help right away. Too much acetaminophen can be very dangerous and cause liver damage. Even if you do not have symptoms, it is important to get help right away. You may get drowsy or dizzy. Do not drive, use machinery, or do anything that needs mental alertness until you know how this medicine affects  you. Do not stand or sit up quickly, especially if you are an older patient. This reduces the risk of dizzy or fainting spells. Alcohol may interfere with the effect of this medicine. Avoid alcoholic drinks. The medicine will cause constipation. Try to have a bowel movement at least every 2 to 3 days. If you do not have a bowel movement for 3 days, call your doctor or health care professional. Your mouth may get dry. Chewing sugarless gum or sucking hard candy, and drinking plenty or water may help. Contact your doctor if the problem does not go away or is severe. What side effects may I notice from receiving this medicine? Side effects that you should report to your doctor or health care professional as soon as possible: -allergic reactions like skin rash, itching or hives, swelling of the face, lips, or tongue -breathing problems -confusion -redness, blistering, peeling or loosening of the skin, including inside the mouth -signs and symptoms of liver injury like dark yellow or brown urine; general ill feeling or flu-like symptoms; light-colored stools; loss of appetite; nausea; right upper belly pain; unusually weak or tired; yellowing of the eyes or skin -signs and symptoms of low blood pressure like dizziness; feeling faint or lightheaded, falls; unusually weak or tired -trouble passing urine or change in the amount of urine Side effects that usually do not require medical attention (report to your doctor or health care professional if they continue or are bothersome): -constipation -dry mouth -nausea, vomiting -tiredness This list may not describe all possible side effects. Call your doctor for medical advice about side effects. You may report side effects to FDA at 1-800-FDA-1088. Where should I keep my medicine? Keep out of the reach of children. This medicine can be abused. Keep your medicine in a safe place to protect it from theft. Do not share this medicine with anyone. Selling or  giving away this medicine is dangerous and against the law. This medicine may cause accidental overdose and death if it taken by other adults, children, or pets. Mix any unused medicine with a substance like cat litter or coffee grounds. Then throw the medicine away in a sealed container like a sealed bag or a coffee can with a lid. Do not use the medicine after the expiration date. Store at room temperature between 20 and 25 degrees C (68 and 77 degrees F). NOTE: This sheet is a summary. It may not cover all possible information. If you have questions about this medicine, talk to your doctor, pharmacist, or health care provider.  2018 Elsevier/Gold Standard (2015-06-09 21:48:12) Indwelling Urinary Catheter Care, Adult Take good care of your catheter to keep it working and to prevent problems. How to wear your catheter Attach your catheter to your leg with tape (adhesive tape) or a leg strap. Make sure it is not too tight. If you use tape, remove any bits  of tape that are already on the catheter. How to wear a drainage bag You should have:  A large overnight bag.  A small leg bag.  Overnight Bag You may wear the overnight bag at any time. Always keep the bag below the level of your bladder but off the floor. When you sleep, put a clean plastic bag in a wastebasket. Then hang the bag inside the wastebasket. Leg Bag Never wear the leg bag at night. Always wear the leg bag below your knee. Keep the leg bag secure with a leg strap or tape. How to care for your skin  Clean the skin around the catheter at least once every day.  Shower every day. Do not take baths.  Put creams, lotions, or ointments on your genital area only as told by your doctor.  Do not use powders, sprays, or lotions on your genital area. How to clean your catheter and your skin 1. Wash your hands with soap and water. 2. Wet a washcloth in warm water and gentle (mild) soap. 3. Use the washcloth to clean the skin where  the catheter enters your body. Clean downward and wipe away from the catheter in small circles. Do not wipe toward the catheter. 4. Pat the area dry with a clean towel. Make sure to clean off all soap. How to care for your drainage bags Empty your drainage bag when it is ?- full or at least 2-3 times a day. Replace your drainage bag once a month or sooner if it starts to smell bad or look dirty. Do not clean your drainage bag unless told by your doctor. Emptying a drainage bag  Supplies Needed  Rubbing alcohol.  Gauze pad or cotton ball.  Tape or a leg strap.  Steps 1. Wash your hands with soap and water. 2. Separate (detach) the bag from your leg. 3. Hold the bag over the toilet or a clean container. Keep the bag below your hips and bladder. This stops pee (urine) from going back into the tube. 4. Open the pour spout at the bottom of the bag. 5. Empty the pee into the toilet or container. Do not let the pour spout touch any surface. 6. Put rubbing alcohol on a gauze pad or cotton ball. 7. Use the gauze pad or cotton ball to clean the pour spout. 8. Close the pour spout. 9. Attach the bag to your leg with tape or a leg strap. 10. Wash your hands.  Changing a drainage bag Supplies Needed  Alcohol wipes.  A clean drainage bag.  Adhesive tape or a leg strap.  Steps 1. Wash your hands with soap and water. 2. Separate the dirty bag from your leg. 3. Pinch the rubber catheter with your fingers so that pee does not spill out. 4. Separate the catheter tube from the drainage tube where these tubes connect (at the connection valve). Do not let the tubes touch any surface. 5. Clean the end of the catheter tube with an alcohol wipe. Use a different alcohol wipe to clean the end of the drainage tube. 6. Connect the catheter tube to the drainage tube of the clean bag. 7. Attach the new bag to the leg with adhesive tape or a leg strap. 8. Wash your hands.  How to prevent infection and  other problems  Never pull on your catheter or try to remove it. Pulling can damage tissue in your body.  Always wash your hands before and after touching your catheter.  If a leg strap gets wet, replace it with a dry one.  Drink enough fluids to keep your pee clear or pale yellow, or as told by your doctor.  Do not let the drainage bag or tubing touch the floor.  Wear cotton underwear.  If you are male, wipe from front to back after you poop (have a bowel movement).  Check on the catheter often to make sure it works and the tubing is not twisted. Get help if:  Your pee is cloudy.  Your pee smells unusually bad.  Your pee is not draining into the bag.  Your tube gets clogged.  Your catheter starts to leak.  Your bladder feels full. Get help right away if:  You have redness, swelling, or pain where the catheter enters your body.  You have fluid, pus, or a bad smell coming from the area where the catheter enters your body.  The area where the catheter enters your body feels warm.  You have a fever.  You have pain in your: ? Stomach (abdomen). ? Legs. ? Lower back. ? Bladder.  You see blood fill the catheter.  Your pee is pink or red.  You feel sick to your stomach (nauseous).  You throw up (vomit).  You have chills.  Your catheter gets pulled out. This information is not intended to replace advice given to you by your health care provider. Make sure you discuss any questions you have with your health care provider. Document Released: 01/08/2013 Document Revised: 08/11/2016 Document Reviewed: 02/26/2014 Elsevier Interactive Patient Education  [employer].

## 2017-05-16 NOTE — Anesthesia Procedure Notes (Signed)
Procedure Name: LMA Insertion Date/Time: 05/16/2017 7:56 AM Performed by: Tressie Stalker E Pre-anesthesia Checklist: Patient identified, Patient being monitored, Emergency Drugs available, Timeout performed and Suction available Patient Re-evaluated:Patient Re-evaluated prior to induction Oxygen Delivery Method: Circle System Utilized Preoxygenation: Pre-oxygenation with 100% oxygen Induction Type: IV induction Ventilation: Mask ventilation without difficulty LMA: LMA inserted LMA Size: 4.0 Number of attempts: 1 Placement Confirmation: positive ETCO2 and breath sounds checked- equal and bilateral

## 2017-05-16 NOTE — H&P (Signed)
Urology Admission H&P  Chief Complaint: bladder cancer  History of Present Illness: [name] is a 61yo with bladder cancer here for repeat resection  Past Medical History:  Diagnosis Date  . Anemia   . Arthritis    Hands and left knee  . Coronary artery disease   . Diabetes mellitus without complication ([place])   . High cholesterol   . Hypertension   . MI (myocardial infarction) ([place]) 2009   Past Surgical History:  Procedure Laterality Date  . APPENDECTOMY    . CARDIAC CATHETERIZATION    . COLONOSCOPY    . COLONOSCOPY N/A 04/01/2017   Procedure: COLONOSCOPY;  Surgeon: Daneil Dolin, MD;  Location: [facility];  Service: Endoscopy;  Laterality: N/A;  9:45 AM  . CORONARY STENT PLACEMENT  2009  . CYSTOSCOPY W/ URETERAL STENT PLACEMENT Bilateral 04/11/2017   Procedure: CYSTOSCOPY WITH RETROGRADE PYELOGRAM;  Surgeon: Cleon Gustin, MD;  Location: [facility];  Service: Urology;  Laterality: Bilateral;  . FRACTURE SURGERY Right    Steel Rod in femur  . KNEE ARTHROSCOPY Left   . TONSILLECTOMY    . TRANSURETHRAL RESECTION OF BLADDER TUMOR N/A 04/11/2017   Procedure: TRANSURETHRAL RESECTION OF BLADDER TUMOR (TURBT);  Surgeon: Cleon Gustin, MD;  Location: [facility];  Service: Urology;  Laterality: N/A;  1 HR [phone] BLUE [id number]    Home Medications:  Prescriptions Prior to Admission  Medication Sig Dispense Refill Last Dose  . aspirin 325 MG tablet Take 325 mg by mouth at bedtime.    05/15/2017 at 2100  . celecoxib (CELEBREX) 200 MG capsule Take 200 mg by mouth daily as needed for mild pain.    05/15/2017 at 2100  . Choline Fenofibrate (FENOFIBRIC ACID) 135 MG CPDR Take 135 mg by mouth at bedtime.    05/15/2017 at 2100  . CINNAMON PO Take 1,000 mg by mouth at bedtime.    05/15/2017 at Unknown time  . clopidogrel (PLAVIX) 75 MG tablet TAKE 1 TABLET DAILY (Patient taking differently: TAKE 1 TABLET AT BEDTIME) 90 tablet 3 05/15/2017 at 2100  . Coenzyme Q10 (COQ10)  100 MG CAPS Take 100 mg by mouth at bedtime.   05/15/2017 at Unknown time  . ezetimibe (ZETIA) 10 MG tablet Take 10 mg by mouth at bedtime.    05/15/2017 at 2100  . ibuprofen (ADVIL,MOTRIN) 200 MG tablet Take 200 mg by mouth every 8 (eight) hours as needed for mild pain.   Past Week at Unknown time  . insulin lispro protamine-lispro (HUMALOG 75/25 MIX) (75-25) 100 UNIT/ML SUSP injection Inject 50 Units into the skin 2 (two) times daily with a meal.   05/15/2017 at Unknown time  . metFORMIN (GLUCOPHAGE) 500 MG tablet Take 1,000 mg by mouth 2 (two) times daily with a meal.   05/15/2017 at 2100  . metoprolol succinate (TOPROL-XL) 25 MG 24 hr tablet Take 25 mg by mouth at bedtime.   05/15/2017 at 2100  . Multiple Vitamin (MULTIVITAMIN) tablet Take 1 tablet by mouth daily.   05/15/2017 at Unknown time  . naproxen sodium (ANAPROX) 220 MG tablet Take 440 mg by mouth daily as needed (pain).   Past Week at Unknown time  . Omega-3 Fatty Acids (FISH OIL) 1200 MG CAPS Take 1,200 mg by mouth at bedtime.    05/15/2017 at Unknown time  . rosuvastatin (CRESTOR) 40 MG tablet Take 40 mg by mouth at bedtime.    05/15/2017 at 2100  . tamsulosin (FLOMAX) 0.4 MG CAPS capsule Take  0.4 mg by mouth at bedtime.   05/15/2017 at 2100  . telmisartan (MICARDIS) 80 MG tablet Take 80 mg by mouth at bedtime.    05/15/2017 at 2100  . VICTOZA 18 MG/3ML SOPN Inject 1.8 mg into the skin daily.   05/15/2017 at 0800  . oxyCODONE-acetaminophen (ROXICET) 5-325 MG tablet Take 1 tablet by mouth every 4 (four) hours as needed. (Patient not taking: Reported on 05/10/2017) 30 tablet 0 Not Taking at Unknown time   Allergies: No Known Allergies  Family History  Problem Relation Age of Onset  . Diabetes Mother   . Heart attack Mother   . Cancer Father        lung  . Stroke Paternal Grandfather   . Colon cancer Paternal Uncle    Social History:  reports that he has never smoked. He has never used smokeless tobacco. He reports that he does not drink  alcohol or use drugs.  Review of Systems  Genitourinary: Positive for hematuria.  All other systems reviewed and are negative.   Physical Exam:  Vital signs in last 24 hours: Temp:  [98.2 F (36.8 C)] 98.2 F (36.8 C) (08/20 0642) Pulse Rate:  [49] 49 (08/20 0642) Resp:  [18] 18 (08/20 0642) BP: (124)/(58) 124/58 (08/20 0642) SpO2:  [95 %] 95 % (08/20 0642) Weight:  [90.7 kg (200 lb)] 90.7 kg (200 lb) (08/20 2122) Physical Exam  Constitutional: He is oriented to person, place, and time. He appears well-developed and well-nourished.  HENT:  Head: Normocephalic and atraumatic.  Eyes: Pupils are equal, round, and reactive to light. EOM are normal.  Neck: Normal range of motion. No thyromegaly present.  Cardiovascular: Normal rate and regular rhythm.   Respiratory: Effort normal. No respiratory distress.  GI: Soft. He exhibits no distension.  Musculoskeletal: Normal range of motion. He exhibits no edema.  Neurological: He is alert and oriented to person, place, and time.  Skin: Skin is warm and dry.  Psychiatric: He has a normal mood and affect. His behavior is normal. Judgment and thought content normal.    Laboratory Data:  Results for orders placed or performed during the hospital encounter of 05/16/17 (from the past 24 hour(s))  Glucose, capillary     Status: Abnormal   Collection Time: 05/16/17  6:47 AM  Result Value Ref Range   Glucose-Capillary 263 (H) 65 - 99 mg/dL  Glucose, capillary     Status: Abnormal   Collection Time: 05/16/17  7:32 AM  Result Value Ref Range   Glucose-Capillary 260 (H) 65 - 99 mg/dL   No results found for this or any previous visit (from the past 240 hour(s)). Creatinine:  Recent Labs  05/11/17 0919  CREATININE 1.11   Baseline Creatinine: 1.1  Impression/Assessment:  60yo with bladder cancer  Plan:  The risks/benefits/alternatives to TURBt was explained to the patient and he understands and wishes to proceed with surgery  Nicolette Bang 05/16/2017, 7:46 AM

## 2017-05-16 NOTE — Anesthesia Postprocedure Evaluation (Signed)
Anesthesia Post Note  Patient: ZEV BLUE  Procedure(s) Performed: Procedure(s) (LRB): TRANSURETHRAL RESECTION OF BLADDER TUMOR (TURBT) (N/A) CYSTOSCOPY (N/A)  Patient location during evaluation: PACU Anesthesia Type: General Level of consciousness: awake and alert and oriented Pain management: pain level controlled Vital Signs Assessment: post-procedure vital signs reviewed and stable Respiratory status: spontaneous breathing Cardiovascular status: blood pressure returned to baseline and stable Postop Assessment: no signs of nausea or vomiting Anesthetic complications: no Comments: Late entry     Last Vitals:  Vitals:   05/16/17 0925 05/16/17 0934  BP: 132/68 (!) 128/59  Pulse: (!) 46 (!) 50  Resp: 13 14  Temp:  36.7 C  SpO2: 96% 97%    Last Pain:  Vitals:   05/16/17 0934  TempSrc: Oral                 Naeemah Jasmer

## 2017-05-16 NOTE — Transfer of Care (Signed)
Immediate Anesthesia Transfer of Care Note  Patient: Raymond [name]  Procedure(s) Performed: Procedure(s): TRANSURETHRAL RESECTION OF BLADDER TUMOR (TURBT) (N/A) CYSTOSCOPY (N/A)  Patient Location: PACU  Anesthesia Type:General  Level of Consciousness: sedated  Airway & Oxygen Therapy: Patient Spontanous Breathing and Patient connected to nasal cannula oxygen  Post-op Assessment: Report given to RN  Post vital signs: Reviewed and stable  Last Vitals:  Vitals:   05/16/17 0730 05/16/17 0740  BP:  119/65  Pulse:    Resp: 17 17  Temp:    SpO2: 94% 95%    Last Pain:  Vitals:   05/16/17 0642  TempSrc: Oral      Patients Stated Pain Goal: 6 (26/94/85 4627)  Complications: No apparent anesthesia complications

## 2017-05-16 NOTE — Anesthesia Preprocedure Evaluation (Signed)
Anesthesia Evaluation  Patient identified by MRN, date of birth, ID band Patient awake    Airway Mallampati: I  TM Distance: >3 FB Neck ROM: Full    Dental  (+) Teeth Intact   Pulmonary    Pulmonary exam normal        Cardiovascular hypertension, Pt. on home beta blockers + CAD and + Past MI (9 years ago, now no CP, SOB, stable >27mets)  Normal cardiovascular exam Rhythm:Regular Rate:Normal     Neuro/Psych    GI/Hepatic   Endo/Other  diabetes, Well Controlled, Type 1, Insulin Dependent, Oral Hypoglycemic Agents  Renal/GU      Musculoskeletal  (+) Arthritis , Osteoarthritis,    Abdominal Normal abdominal exam  (+)   Peds  Hematology   Anesthesia Other Findings   Reproductive/Obstetrics                             Anesthesia Physical Anesthesia Plan  ASA: III  Anesthesia Plan: General   Post-op Pain Management:    Induction:   PONV Risk Score and Plan:   Airway Management Planned: LMA  Additional Equipment:   Intra-op Plan:   Post-operative Plan: Extubation in OR  Informed Consent: I have reviewed the patients History and Physical, chart, labs and discussed the procedure including the risks, benefits and alternatives for the proposed anesthesia with the patient or authorized representative who has indicated his/her understanding and acceptance.     Plan Discussed with: CRNA  Anesthesia Plan Comments:         Anesthesia Quick Evaluation

## 2017-05-17 ENCOUNTER — Telehealth: Payer: Self-pay | Admitting: Cardiovascular Disease

## 2017-05-17 NOTE — Telephone Encounter (Signed)
Pt said he was returning a call from a few days ago,he does not know who it was that called.

## 2017-05-17 NOTE — Telephone Encounter (Signed)
Returned call to patient stress test results given. 

## 2017-05-18 ENCOUNTER — Encounter (HOSPITAL_COMMUNITY): Payer: Self-pay | Admitting: Urology

## 2017-05-20 ENCOUNTER — Ambulatory Visit (INDEPENDENT_AMBULATORY_CARE_PROVIDER_SITE_OTHER): Payer: Federal, State, Local not specified - PPO | Admitting: Urology

## 2017-05-20 DIAGNOSIS — C672 Malignant neoplasm of lateral wall of bladder: Secondary | ICD-10-CM | POA: Diagnosis not present

## 2017-06-01 DIAGNOSIS — E11319 Type 2 diabetes mellitus with unspecified diabetic retinopathy without macular edema: Secondary | ICD-10-CM | POA: Diagnosis not present

## 2017-06-20 DIAGNOSIS — E118 Type 2 diabetes mellitus with unspecified complications: Secondary | ICD-10-CM | POA: Diagnosis not present

## 2017-06-20 DIAGNOSIS — I10 Essential (primary) hypertension: Secondary | ICD-10-CM | POA: Diagnosis not present

## 2017-06-20 DIAGNOSIS — E789 Disorder of lipoprotein metabolism, unspecified: Secondary | ICD-10-CM | POA: Diagnosis not present

## 2017-06-22 ENCOUNTER — Ambulatory Visit (INDEPENDENT_AMBULATORY_CARE_PROVIDER_SITE_OTHER): Payer: Federal, State, Local not specified - PPO | Admitting: Urology

## 2017-06-22 DIAGNOSIS — C672 Malignant neoplasm of lateral wall of bladder: Secondary | ICD-10-CM | POA: Diagnosis not present

## 2017-06-27 DIAGNOSIS — E789 Disorder of lipoprotein metabolism, unspecified: Secondary | ICD-10-CM | POA: Diagnosis not present

## 2017-06-27 DIAGNOSIS — E118 Type 2 diabetes mellitus with unspecified complications: Secondary | ICD-10-CM | POA: Diagnosis not present

## 2017-06-27 DIAGNOSIS — Z23 Encounter for immunization: Secondary | ICD-10-CM | POA: Diagnosis not present

## 2017-06-27 DIAGNOSIS — I2510 Atherosclerotic heart disease of native coronary artery without angina pectoris: Secondary | ICD-10-CM | POA: Diagnosis not present

## 2017-06-27 DIAGNOSIS — I10 Essential (primary) hypertension: Secondary | ICD-10-CM | POA: Diagnosis not present

## 2017-06-29 ENCOUNTER — Ambulatory Visit (INDEPENDENT_AMBULATORY_CARE_PROVIDER_SITE_OTHER): Payer: Federal, State, Local not specified - PPO | Admitting: Urology

## 2017-06-29 DIAGNOSIS — C672 Malignant neoplasm of lateral wall of bladder: Secondary | ICD-10-CM

## 2017-07-06 ENCOUNTER — Ambulatory Visit (INDEPENDENT_AMBULATORY_CARE_PROVIDER_SITE_OTHER): Payer: Federal, State, Local not specified - PPO | Admitting: Urology

## 2017-07-06 DIAGNOSIS — C672 Malignant neoplasm of lateral wall of bladder: Secondary | ICD-10-CM | POA: Diagnosis not present

## 2017-07-13 ENCOUNTER — Ambulatory Visit (INDEPENDENT_AMBULATORY_CARE_PROVIDER_SITE_OTHER): Payer: Federal, State, Local not specified - PPO | Admitting: Urology

## 2017-07-13 DIAGNOSIS — C672 Malignant neoplasm of lateral wall of bladder: Secondary | ICD-10-CM | POA: Diagnosis not present

## 2017-07-20 ENCOUNTER — Ambulatory Visit (INDEPENDENT_AMBULATORY_CARE_PROVIDER_SITE_OTHER): Payer: Federal, State, Local not specified - PPO | Admitting: Urology

## 2017-07-20 DIAGNOSIS — C672 Malignant neoplasm of lateral wall of bladder: Secondary | ICD-10-CM

## 2017-07-27 ENCOUNTER — Ambulatory Visit (INDEPENDENT_AMBULATORY_CARE_PROVIDER_SITE_OTHER): Payer: Federal, State, Local not specified - PPO | Admitting: Urology

## 2017-07-27 DIAGNOSIS — C672 Malignant neoplasm of lateral wall of bladder: Secondary | ICD-10-CM

## 2017-08-09 ENCOUNTER — Ambulatory Visit (INDEPENDENT_AMBULATORY_CARE_PROVIDER_SITE_OTHER): Payer: Federal, State, Local not specified - PPO | Admitting: General Surgery

## 2017-08-09 ENCOUNTER — Encounter: Payer: Self-pay | Admitting: General Surgery

## 2017-08-09 VITALS — BP 148/63 | HR 57 | Temp 97.8°F | Resp 18 | Ht 73.0 in | Wt 211.0 lb

## 2017-08-09 DIAGNOSIS — K4090 Unilateral inguinal hernia, without obstruction or gangrene, not specified as recurrent: Secondary | ICD-10-CM | POA: Diagnosis not present

## 2017-08-09 DIAGNOSIS — I2510 Atherosclerotic heart disease of native coronary artery without angina pectoris: Secondary | ICD-10-CM

## 2017-08-09 NOTE — Patient Instructions (Addendum)
Stop Plavix today.  Inguinal Hernia, Adult An inguinal hernia is when fat or the intestines push through the area where the leg meets the lower abdomen (groin) and create a rounded lump (bulge). This condition develops over time. There are three types of inguinal hernias. These types include:  Hernias that can be pushed back into the belly (are reducible).  Hernias that are not reducible (are incarcerated).  Hernias that are not reducible and lose their blood supply (are strangulated). This type of hernia requires emergency surgery.  What are the causes? This condition is caused by having a weak spot in the muscles or tissue. This weakness lets the hernia poke through. This condition can be triggered by:  Suddenly straining the muscles of the lower abdomen.  Lifting heavy objects.  Straining to have a bowel movement. Difficult bowel movements (constipation) can lead to this.  Coughing.  What increases the risk? This condition is more likely to develop in:  Men.  Pregnant women.  People who: ? Are overweight. ? Work in jobs that require long periods of standing or heavy lifting. ? Have had an inguinal hernia before. ? Smoke or have lung disease. These factors can lead to long-lasting (chronic) coughing.  What are the signs or symptoms? Symptoms can depend on the size of the hernia. Often, a small inguinal hernia has no symptoms. Symptoms of a larger hernia include:  A lump in the groin. This is easier to see when the person is standing. It might not be visible when he or she is lying down.  Pain or burning in the groin. This occurs especially when lifting, straining, or coughing.  A dull ache or a feeling of pressure in the groin.  A lump in the scrotum in men.  Symptoms of a strangulated inguinal hernia can include:  A bulge in the groin that is very painful and tender to the touch.  A bulge that turns red or purple.  Fever, nausea, and vomiting.  The inability  to have a bowel movement or to pass gas.  How is this diagnosed? This condition is diagnosed with a medical history and physical exam. Your health care provider may feel your groin area and ask you to cough. How is this treated? Treatment for this condition varies depending on the size of your hernia and whether you have symptoms. If you do not have symptoms, your health care provider may have you watch your hernia carefully and come in for follow-up visits. If your hernia is larger or if you have symptoms, your treatment will include surgery. Follow these instructions at home: Lifestyle  Drink enough fluid to keep your urine clear or pale yellow.  Eat a diet that includes a lot of fiber. Eat plenty of fruits, vegetables, and whole grains. Talk with your health care provider if you have questions.  Avoid lifting heavy objects.  Avoid standing for long periods of time.  Do not use tobacco products, including cigarettes, chewing tobacco, or e-cigarettes. If you need help quitting, ask your health care provider.  Maintain a healthy weight. General instructions  Do not try to force the hernia back in.  Watch your hernia for any changes in color or size. Let your health care provider know if any changes occur.  Take over-the-counter and prescription medicines only as told by your health care provider.  Keep all follow-up visits as told by your health care provider. This is important. Contact a health care provider if:  You have a fever.  You have new symptoms.  Your symptoms get worse. Get help right away if:  You have pain in the groin that suddenly gets worse.  A bulge in the groin gets bigger suddenly and does not go down.  You are a man and you have a sudden pain in the scrotum, or the size of your scrotum suddenly changes.  A bulge in the groin area becomes red or purple and is painful to the touch.  You have nausea or vomiting that does not go away.  You feel your  heart beating a lot more quickly than normal.  You cannot have a bowel movement or pass gas. This information is not intended to replace advice given to you by your health care provider. Make sure you discuss any questions you have with your health care provider. Document Released: 01/30/2009 Document Revised: 02/19/2016 Document Reviewed: 07/24/2014 Elsevier Interactive Patient Education  2018 [employer].

## 2017-08-09 NOTE — Progress Notes (Signed)
Raymond [name] [place]; [id number]; 1956/07/26   HPI Patient is a 61 year old white male who was referred by care by Dr. Luan Pulling for evaluation and treatment of a left inguinal hernia.  It has been present for several months, but recently has caused increasing pain with movement or straining.  He currently has a pain of 3 out of 10.  No hernia was noted by urology, who has been treating the patient for a bladder tumor.  He denies any nausea or vomiting. Past Medical History:  Diagnosis Date  . Anemia   . Arthritis    Hands and left knee  . Coronary artery disease   . Diabetes mellitus without complication ([place])   . High cholesterol   . Hypertension   . MI (myocardial infarction) ([place]) 2009    Past Surgical History:  Procedure Laterality Date  . APPENDECTOMY    . CARDIAC CATHETERIZATION    . COLONOSCOPY    . CORONARY STENT PLACEMENT  2009  . FRACTURE SURGERY Right    Steel Rod in femur  . KNEE ARTHROSCOPY Left   . TONSILLECTOMY      Family History  Problem Relation Age of Onset  . Diabetes Mother   . Heart attack Mother   . Cancer Father        lung  . Stroke Paternal Grandfather   . Colon cancer Paternal Uncle     Current Outpatient Medications on File Prior to Visit  Medication Sig Dispense Refill  . aspirin 325 MG tablet Take 325 mg by mouth at bedtime.     . celecoxib (CELEBREX) 200 MG capsule Take 200 mg by mouth daily as needed for mild pain.     . Choline Fenofibrate (FENOFIBRIC ACID) 135 MG CPDR Take 135 mg by mouth at bedtime.     Marland Kitchen CINNAMON PO Take 1,000 mg by mouth at bedtime.     . clopidogrel (PLAVIX) 75 MG tablet TAKE 1 TABLET DAILY (Patient taking differently: TAKE 1 TABLET AT BEDTIME) 90 tablet 3  . Coenzyme Q10 (COQ10) 100 MG CAPS Take 100 mg by mouth at bedtime.    Marland Kitchen ezetimibe (ZETIA) 10 MG tablet Take 10 mg by mouth at bedtime.     Marland Kitchen ibuprofen (ADVIL,MOTRIN) 200 MG tablet Take 200 mg by mouth every 8 (eight) hours as needed for mild pain.    Marland Kitchen insulin lispro  protamine-lispro (HUMALOG 75/25 MIX) (75-25) 100 UNIT/ML SUSP injection Inject 50 Units into the skin 2 (two) times daily with a meal.    . metFORMIN (GLUCOPHAGE) 500 MG tablet Take 1,000 mg by mouth 2 (two) times daily with a meal.    . metoprolol succinate (TOPROL-XL) 25 MG 24 hr tablet Take 25 mg by mouth at bedtime.    . Multiple Vitamin (MULTIVITAMIN) tablet Take 1 tablet by mouth daily.    . naproxen sodium (ANAPROX) 220 MG tablet Take 440 mg by mouth daily as needed (pain).    . Omega-3 Fatty Acids (FISH OIL) 1200 MG CAPS Take 1,200 mg by mouth at bedtime.     Marland Kitchen oxyCODONE-acetaminophen (ROXICET) 5-325 MG tablet Take 1 tablet by mouth every 4 (four) hours as needed. 30 tablet 0  . rosuvastatin (CRESTOR) 40 MG tablet Take 40 mg by mouth at bedtime.     . tamsulosin (FLOMAX) 0.4 MG CAPS capsule Take 0.4 mg by mouth at bedtime.    Marland Kitchen telmisartan (MICARDIS) 80 MG tablet Take 80 mg by mouth at bedtime.     Marland Kitchen VICTOZA 18  MG/3ML SOPN Inject 1.8 mg into the skin daily.     No current facility-administered medications on file prior to visit.     No Known Allergies  Social History   Substance and Sexual Activity  Alcohol Use No    Social History   Tobacco Use  Smoking Status Never Smoker  Smokeless Tobacco Never Used    Review of Systems  Constitutional: Negative.   HENT: Negative.   Eyes: Negative.   Respiratory: Negative.   Cardiovascular: Negative.   Gastrointestinal: Positive for abdominal pain.  Genitourinary: Negative.   Musculoskeletal: Positive for back pain and joint pain.  Skin: Negative.   Neurological: Negative.   Endo/Heme/Allergies: Bruises/bleeds easily.  Psychiatric/Behavioral: Negative.     Objective   Vitals:   08/09/17 1054  BP: (!) 148/63  Pulse: (!) 57  Resp: 18  Temp: 97.8 F (36.6 C)    Physical Exam  Constitutional: He is oriented to person, place, and time and well-developed, well-nourished, and in no distress.  HENT:  Head: Normocephalic  and atraumatic.  Cardiovascular: Normal rate, regular rhythm and normal heart sounds. Exam reveals no gallop and no friction rub.  No murmur heard. Pulmonary/Chest: Effort normal and breath sounds normal. No respiratory distress. He has no wheezes. He has no rales.  Abdominal: Soft. Bowel sounds are normal. He exhibits no distension. There is no tenderness. There is no rebound.  Reducible left inguinal hernia noted.  Neurological: He is alert and oriented to person, place, and time.  Skin: Skin is warm and dry.  Vitals reviewed.   Assessment  Left inguinal hernia Plan   Patient is scheduled for left inguinal herniorrhaphy with mesh on 08/15/2017.  The risks and benefits of the procedure including bleeding, infection, mesh use, and the possibility of recurrence of the hernia were fully explained to the patient, who gave informed consent.  Patient instructed to stop his Plavix today.

## 2017-08-09 NOTE — H&P (Signed)
Raymond [name]; [id number]; [birth year]   HPI Patient is a 61 year old white male who was referred by care by Dr. Luan Pulling for evaluation and treatment of a left inguinal hernia.  It has been present for several months, but recently has caused increasing pain with movement or straining.  He currently has a pain of 3 out of 10.  No hernia was noted by urology, who has been treating the patient for a bladder tumor.  He denies any nausea or vomiting. Past Medical History:  Diagnosis Date  . Anemia   . Arthritis    Hands and left knee  . Coronary artery disease   . Diabetes mellitus without complication ([place])   . High cholesterol   . Hypertension   . MI (myocardial infarction) ([place]) 2009    Past Surgical History:  Procedure Laterality Date  . APPENDECTOMY    . CARDIAC CATHETERIZATION    . COLONOSCOPY    . CORONARY STENT PLACEMENT  2009  . FRACTURE SURGERY Right    Steel Rod in femur  . KNEE ARTHROSCOPY Left   . TONSILLECTOMY      Family History  Problem Relation Age of Onset  . Diabetes Mother   . Heart attack Mother   . Cancer Father        lung  . Stroke Paternal Grandfather   . Colon cancer Paternal Uncle     Current Outpatient Medications on File Prior to Visit  Medication Sig Dispense Refill  . aspirin 325 MG tablet Take 325 mg by mouth at bedtime.     . celecoxib (CELEBREX) 200 MG capsule Take 200 mg by mouth daily as needed for mild pain.     . Choline Fenofibrate (FENOFIBRIC ACID) 135 MG CPDR Take 135 mg by mouth at bedtime.     Marland Kitchen CINNAMON PO Take 1,000 mg by mouth at bedtime.     . clopidogrel (PLAVIX) 75 MG tablet TAKE 1 TABLET DAILY (Patient taking differently: TAKE 1 TABLET AT BEDTIME) 90 tablet 3  . Coenzyme Q10 (COQ10) 100 MG CAPS Take 100 mg by mouth at bedtime.    Marland Kitchen ezetimibe (ZETIA) 10 MG tablet Take 10 mg by mouth at bedtime.     Marland Kitchen ibuprofen (ADVIL,MOTRIN) 200 MG tablet Take 200 mg by mouth every 8 (eight) hours as needed for mild pain.    Marland Kitchen insulin lispro  protamine-lispro (HUMALOG 75/25 MIX) (75-25) 100 UNIT/ML SUSP injection Inject 50 Units into the skin 2 (two) times daily with a meal.    . metFORMIN (GLUCOPHAGE) 500 MG tablet Take 1,000 mg by mouth 2 (two) times daily with a meal.    . metoprolol succinate (TOPROL-XL) 25 MG 24 hr tablet Take 25 mg by mouth at bedtime.    . Multiple Vitamin (MULTIVITAMIN) tablet Take 1 tablet by mouth daily.    . naproxen sodium (ANAPROX) 220 MG tablet Take 440 mg by mouth daily as needed (pain).    . Omega-3 Fatty Acids (FISH OIL) 1200 MG CAPS Take 1,200 mg by mouth at bedtime.     Marland Kitchen oxyCODONE-acetaminophen (ROXICET) 5-325 MG tablet Take 1 tablet by mouth every 4 (four) hours as needed. 30 tablet 0  . rosuvastatin (CRESTOR) 40 MG tablet Take 40 mg by mouth at bedtime.     . tamsulosin (FLOMAX) 0.4 MG CAPS capsule Take 0.4 mg by mouth at bedtime.    Marland Kitchen telmisartan (MICARDIS) 80 MG tablet Take 80 mg by mouth at bedtime.     Marland Kitchen VICTOZA 18  MG/3ML SOPN Inject 1.8 mg into the skin daily.     No current facility-administered medications on file prior to visit.     No Known Allergies  Social History   Substance and Sexual Activity  Alcohol Use No    Social History   Tobacco Use  Smoking Status Never Smoker  Smokeless Tobacco Never Used    Review of Systems  Constitutional: Negative.   HENT: Negative.   Eyes: Negative.   Respiratory: Negative.   Cardiovascular: Negative.   Gastrointestinal: Positive for abdominal pain.  Genitourinary: Negative.   Musculoskeletal: Positive for back pain and joint pain.  Skin: Negative.   Neurological: Negative.   Endo/Heme/Allergies: Bruises/bleeds easily.  Psychiatric/Behavioral: Negative.     Objective   Vitals:   08/09/17 1054  BP: (!) 148/63  Pulse: (!) 57  Resp: 18  Temp: 97.8 F (36.6 C)    Physical Exam  Constitutional: He is oriented to person, place, and time and well-developed, well-nourished, and in no distress.  HENT:  Head: Normocephalic  and atraumatic.  Cardiovascular: Normal rate, regular rhythm and normal heart sounds. Exam reveals no gallop and no friction rub.  No murmur heard. Pulmonary/Chest: Effort normal and breath sounds normal. No respiratory distress. He has no wheezes. He has no rales.  Abdominal: Soft. Bowel sounds are normal. He exhibits no distension. There is no tenderness. There is no rebound.  Reducible left inguinal hernia noted.  Neurological: He is alert and oriented to person, place, and time.  Skin: Skin is warm and dry.  Vitals reviewed.   Assessment  Left inguinal hernia Plan   Patient is scheduled for left inguinal herniorrhaphy with mesh on 08/15/2017.  The risks and benefits of the procedure including bleeding, infection, mesh use, and the possibility of recurrence of the hernia were fully explained to the patient, who gave informed consent.  Patient instructed to stop his Plavix today.

## 2017-08-11 NOTE — Patient Instructions (Signed)
Raymond [name]  08/11/2017     @PREFPERIOPPHARMACY @   Your procedure is scheduled on  08/15/2017   Report to [facility] at  840  A.M.  Call this number if you have problems the morning of surgery:  [phone]   Remember:  Do not eat food or drink liquids after midnight.  Take these medicines the morning of surgery with A SIP OF WATER  Celebrex, oxycodone.   Do not wear jewelry, make-up or nail polish.  Do not wear lotions, powders, or perfumes, or deoderant.  Do not shave 48 hours prior to surgery.  Men may shave face and neck.  Do not bring valuables to the hospital.  [facility] is not responsible for any belongings or valuables.  Contacts, dentures or bridgework may not be worn into surgery.  Leave your suitcase in the car.  After surgery it may be brought to your room.  For patients admitted to the hospital, discharge time will be determined by your treatment team.  Patients discharged the day of surgery will not be allowed to drive home.   Name and phone number of your driver:   family Special instructions:  None  Please read over the following fact sheets that you were given. Anesthesia Post-op Instructions and Care and Recovery After Surgery       Open Hernia Repair, Adult Open hernia repair is a surgical procedure to fix a hernia. A hernia occurs when an internal organ or tissue pushes out through a weak spot in the abdominal wall muscles. Hernias commonly occur in the groin and around the navel. Most hernias tend to get worse over time. Often, surgery is done to prevent the hernia from becoming bigger, uncomfortable, or an emergency. Emergency surgery may be needed if abdominal contents get stuck in the opening (incarcerated hernia) or the blood supply gets cut off (strangulated hernia). In an open repair, an incision is made in the abdomen to perform the surgery. Tell a health care provider about:  Any allergies you have.  All medicines you are  taking, including vitamins, herbs, eye drops, creams, and over-the-counter medicines.  Any problems you or family members have had with anesthetic medicines.  Any blood or bone disorders you have.  Any surgeries you have had.  Any medical conditions you have, including any recent cold or flu symptoms.  Whether you are pregnant or may be pregnant. What are the risks? Generally, this is a safe procedure. However, problems may occur, including:  Long-lasting (chronic) pain.  Bleeding.  Infection.  Damage to the testicle. This can cause shrinking or swelling.  Damage to the bladder, blood vessels, intestine, or nerves near the hernia.  Trouble passing urine.  Allergic reactions to medicines.  Return of the hernia.  What happens before the procedure? Staying hydrated Follow instructions from your health care provider about hydration, which may include:  Up to 2 hours before the procedure - you may continue to drink clear liquids, such as water, clear fruit juice, black coffee, and plain tea.  Eating and drinking restrictions Follow instructions from your health care provider about eating and drinking, which may include:  8 hours before the procedure - stop eating heavy meals or foods such as meat, fried foods, or fatty foods.  6 hours before the procedure - stop eating light meals or foods, such as toast or cereal.  6 hours before the procedure - stop drinking milk or drinks that  contain milk.  2 hours before the procedure - stop drinking clear liquids.  Medicines  Ask your health care provider about: ? Changing or stopping your regular medicines. This is especially important if you are taking diabetes medicines or blood thinners. ? Taking medicines such as aspirin and ibuprofen. These medicines can thin your blood. Do not take these medicines before your procedure if your health care provider instructs you not to.  You may be given antibiotic medicine to help prevent  infection. General instructions  You may have blood tests or imaging studies.  Ask your health care provider how your surgical site will be marked or identified.  If you smoke, do not smoke for at least 2 weeks before your procedure or for as long as told by your health care provider.  Let your health care provider know if you develop a cold or any infection before your surgery.  Plan to have someone take you home from the hospital or clinic.  If you will be going home right after the procedure, plan to have someone with you for 24 hours. What happens during the procedure?  To reduce your risk of infection: ? Your health care team will wash or sanitize their hands. ? Your skin will be washed with soap. ? Hair may be removed from the surgical area.  An IV tube will be inserted into one of your veins.  You will be given one or more of the following: ? A medicine to help you relax (sedative). ? A medicine to numb the area (local anesthetic). ? A medicine to make you fall asleep (general anesthetic).  Your surgeon will make an incision over the hernia.  The tissues of the hernia will be moved back into place.  The edges of the hernia may be stitched together.  The opening in the abdominal muscles will be closed with stitches (sutures). Or, your surgeon will place a mesh patch made of manmade (synthetic) material over the opening.  The incision will be closed.  A bandage (dressing) may be placed over the incision. The procedure may vary among health care providers and hospitals. What happens after the procedure?  Your blood pressure, heart rate, breathing rate, and blood oxygen level will be monitored until the medicines you were given have worn off.  You may be given medicine for pain.  Do not drive for 24 hours if you received a sedative. This information is not intended to replace advice given to you by your health care provider. Make sure you discuss any questions you  have with your health care provider. Document Released: 03/09/2001 Document Revised: 04/02/2016 Document Reviewed: 02/25/2016 Elsevier Interactive Patient Education  2018 [place], Adult, Care After These instructions give you information about caring for yourself after your procedure. Your doctor may also give you more specific instructions. If you have problems or questions, contact your doctor. Follow these instructions at home: Surgical cut (incision) care   Follow instructions from your doctor about how to take care of your surgical cut area. Make sure you: ? Wash your hands with soap and water before you change your bandage (dressing). If you cannot use soap and water, use hand sanitizer. ? Change your bandage as told by your doctor. ? Leave stitches (sutures), skin glue, or skin tape (adhesive) strips in place. They may need to stay in place for 2 weeks or longer. If tape strips get loose and curl up, you may trim the loose edges. Do  not remove tape strips completely unless your doctor says it is okay.  Check your surgical cut every day for signs of infection. Check for: ? More redness, swelling, or pain. ? More fluid or blood. ? Warmth. ? Pus or a bad smell. Activity  Do not drive or use heavy machinery while taking prescription pain medicine. Do not drive until your doctor says it is okay.  Until your doctor says it is okay: ? Do not lift anything that is heavier than 10 lb (4.5 kg). ? Do not play contact sports.  Return to your normal activities as told by your doctor. Ask your doctor what activities are safe. General instructions  To prevent or treat having a hard time pooping (constipation) while you are taking prescription pain medicine, your doctor may recommend that you: ? Drink enough fluid to keep your pee (urine) clear or pale yellow. ? Take over-the-counter or prescription medicines. ? Eat foods that are high in fiber, such as fresh  fruits and vegetables, whole grains, and beans. ? Limit foods that are high in fat and processed sugars, such as fried and sweet foods.  Take over-the-counter and prescription medicines only as told by your doctor.  Do not take baths, swim, or use a hot tub until your doctor says it is okay.  Keep all follow-up visits as told by your doctor. This is important. Contact a doctor if:  You develop a rash.  You have more redness, swelling, or pain around your surgical cut.  You have more fluid or blood coming from your surgical cut.  Your surgical cut feels warm to the touch.  You have pus or a bad smell coming from your surgical cut.  You have a fever or chills.  You have blood in your poop (stool).  You have not pooped in 2-3 days.  Medicine does not help your pain. Get help right away if:  You have chest pain or you are short of breath.  You feel light-headed.  You feel weak and dizzy (feel faint).  You have very bad pain.  You throw up (vomit) and your pain is worse. This information is not intended to replace advice given to you by your health care provider. Make sure you discuss any questions you have with your health care provider. Document Released: 10/04/2014 Document Revised: 04/02/2016 Document Reviewed: 02/25/2016 Elsevier Interactive Patient Education  2017 [place] Anesthesia, Adult General anesthesia is the use of medicines to make a person "go to sleep" (be unconscious) for a medical procedure. General anesthesia is often recommended when a procedure:  Is long.  Requires you to be still or in an unusual position.  Is major and can cause you to lose blood.  Is impossible to do without general anesthesia.  The medicines used for general anesthesia are called general anesthetics. In addition to making you sleep, the medicines:  Prevent pain.  Control your blood pressure.  Relax your muscles.  Tell a health care provider about:  Any  allergies you have.  All medicines you are taking, including vitamins, herbs, eye drops, creams, and over-the-counter medicines.  Any problems you or family members have had with anesthetic medicines.  Types of anesthetics you have had in the past.  Any bleeding disorders you have.  Any surgeries you have had.  Any medical conditions you have.  Any history of heart or lung conditions, such as heart failure, sleep apnea, or chronic obstructive pulmonary disease (COPD).  Whether you are pregnant  or may be pregnant.  Whether you use tobacco, alcohol, marijuana, or street drugs.  Any history of Armed forces logistics/support/administrative officer.  Any history of depression or anxiety. What are the risks? Generally, this is a safe procedure. However, problems may occur, including:  Allergic reaction to anesthetics.  Lung and heart problems.  Inhaling food or liquids from your stomach into your lungs (aspiration).  Injury to nerves.  Waking up during your procedure and being unable to move (rare).  Extreme agitation or a state of mental confusion (delirium) when you wake up from the anesthetic.  Air in the bloodstream, which can lead to stroke.  These problems are more likely to develop if you are having a major surgery or if you have an advanced medical condition. You can prevent some of these complications by answering all of your health care provider's questions thoroughly and by following all pre-procedure instructions. General anesthesia can cause side effects, including:  Nausea or vomiting  A sore throat from the breathing tube.  Feeling cold or shivery.  Feeling tired, washed out, or achy.  Sleepiness or drowsiness.  Confusion or agitation.  What happens before the procedure? Staying hydrated Follow instructions from your health care provider about hydration, which may include:  Up to 2 hours before the procedure - you may continue to drink clear liquids, such as water, clear fruit juice,  black coffee, and plain tea.  Eating and drinking restrictions Follow instructions from your health care provider about eating and drinking, which may include:  8 hours before the procedure - stop eating heavy meals or foods such as meat, fried foods, or fatty foods.  6 hours before the procedure - stop eating light meals or foods, such as toast or cereal.  6 hours before the procedure - stop drinking milk or drinks that contain milk.  2 hours before the procedure - stop drinking clear liquids.  Medicines  Ask your health care provider about: ? Changing or stopping your regular medicines. This is especially important if you are taking diabetes medicines or blood thinners. ? Taking medicines such as aspirin and ibuprofen. These medicines can thin your blood. Do not take these medicines before your procedure if your health care provider instructs you not to. ? Taking new dietary supplements or medicines. Do not take these during the week before your procedure unless your health care provider approves them.  If you are told to take a medicine or to continue taking a medicine on the day of the procedure, take the medicine with sips of water. General instructions   Ask if you will be going home the same day, the following day, or after a longer hospital stay. ? Plan to have someone take you home. ? Plan to have someone stay with you for the first 24 hours after you leave the hospital or clinic.  For 3-6 weeks before the procedure, try not to use any tobacco products, such as cigarettes, chewing tobacco, and e-cigarettes.  You may brush your teeth on the morning of the procedure, but make sure to spit out the toothpaste. What happens during the procedure?  You will be given anesthetics through a mask and through an IV tube in one of your veins.  You may receive medicine to help you relax (sedative).  As soon as you are asleep, a breathing tube may be used to help you breathe.  An  anesthesia specialist will stay with you throughout the procedure. He or she will help keep you comfortable and  safe by continuing to give you medicines and adjusting the amount of medicine that you get. He or she will also watch your blood pressure, pulse, and oxygen levels to make sure that the anesthetics do not cause any problems.  If a breathing tube was used to help you breathe, it will be removed before you wake up. The procedure may vary among health care providers and hospitals. What happens after the procedure?  You will wake up, often slowly, after the procedure is complete, usually in a recovery area.  Your blood pressure, heart rate, breathing rate, and blood oxygen level will be monitored until the medicines you were given have worn off.  You may be given medicine to help you calm down if you feel anxious or agitated.  If you will be going home the same day, your health care provider may check to make sure you can stand, drink, and urinate.  Your health care providers will treat your pain and side effects before you go home.  Do not drive for 24 hours if you received a sedative.  You may: ? Feel nauseous and vomit. ? Have a sore throat. ? Have mental slowness. ? Feel cold or shivery. ? Feel sleepy. ? Feel tired. ? Feel sore or achy, even in parts of your body where you did not have surgery. This information is not intended to replace advice given to you by your health care provider. Make sure you discuss any questions you have with your health care provider. Document Released: 12/21/2007 Document Revised: 02/24/2016 Document Reviewed: 08/28/2015 Elsevier Interactive Patient Education  2018 [place] Anesthesia, Adult, Care After These instructions provide you with information about caring for yourself after your procedure. Your health care provider may also give you more specific instructions. Your treatment has been planned according to current medical  practices, but problems sometimes occur. Call your health care provider if you have any problems or questions after your procedure. What can I expect after the procedure? After the procedure, it is common to have:  Vomiting.  A sore throat.  Mental slowness.  It is common to feel:  Nauseous.  Cold or shivery.  Sleepy.  Tired.  Sore or achy, even in parts of your body where you did not have surgery.  Follow these instructions at home: For at least 24 hours after the procedure:  Do not: ? Participate in activities where you could fall or become injured. ? Drive. ? Use heavy machinery. ? Drink alcohol. ? Take sleeping pills or medicines that cause drowsiness. ? Make important decisions or sign legal documents. ? Take care of children on your own.  Rest. Eating and drinking  If you vomit, drink water, juice, or soup when you can drink without vomiting.  Drink enough fluid to keep your urine clear or pale yellow.  Make sure you have little or no nausea before eating solid foods.  Follow the diet recommended by your health care provider. General instructions  Have a responsible adult stay with you until you are awake and alert.  Return to your normal activities as told by your health care provider. Ask your health care provider what activities are safe for you.  Take over-the-counter and prescription medicines only as told by your health care provider.  If you smoke, do not smoke without supervision.  Keep all follow-up visits as told by your health care provider. This is important. Contact a health care provider if:  You continue to have nausea or vomiting  at home, and medicines are not helpful.  You cannot drink fluids or start eating again.  You cannot urinate after 8-12 hours.  You develop a skin rash.  You have fever.  You have increasing redness at the site of your procedure. Get help right away if:  You have difficulty breathing.  You have  chest pain.  You have unexpected bleeding.  You feel that you are having a life-threatening or urgent problem. This information is not intended to replace advice given to you by your health care provider. Make sure you discuss any questions you have with your health care provider. Document Released: 12/20/2000 Document Revised: 02/16/2016 Document Reviewed: 08/28/2015 Elsevier Interactive Patient Education  [employer].

## 2017-08-12 ENCOUNTER — Encounter (HOSPITAL_COMMUNITY): Payer: Self-pay

## 2017-08-12 ENCOUNTER — Encounter (HOSPITAL_COMMUNITY)
Admission: RE | Admit: 2017-08-12 | Discharge: 2017-08-12 | Disposition: A | Discharge status: Home / Self Care | Payer: Federal, State, Local not specified - PPO | Source: Ambulatory Visit | Attending: General Surgery | Admitting: General Surgery

## 2017-08-12 ENCOUNTER — Other Ambulatory Visit: Payer: Self-pay

## 2017-08-12 DIAGNOSIS — I252 Old myocardial infarction: Secondary | ICD-10-CM | POA: Diagnosis not present

## 2017-08-12 DIAGNOSIS — Z7982 Long term (current) use of aspirin: Secondary | ICD-10-CM | POA: Diagnosis not present

## 2017-08-12 DIAGNOSIS — I2510 Atherosclerotic heart disease of native coronary artery without angina pectoris: Secondary | ICD-10-CM | POA: Diagnosis not present

## 2017-08-12 DIAGNOSIS — I10 Essential (primary) hypertension: Secondary | ICD-10-CM | POA: Diagnosis not present

## 2017-08-12 DIAGNOSIS — Z955 Presence of coronary angioplasty implant and graft: Secondary | ICD-10-CM | POA: Diagnosis not present

## 2017-08-12 DIAGNOSIS — E119 Type 2 diabetes mellitus without complications: Secondary | ICD-10-CM | POA: Diagnosis not present

## 2017-08-12 DIAGNOSIS — K4090 Unilateral inguinal hernia, without obstruction or gangrene, not specified as recurrent: Secondary | ICD-10-CM | POA: Diagnosis not present

## 2017-08-12 DIAGNOSIS — Z79899 Other long term (current) drug therapy: Secondary | ICD-10-CM | POA: Diagnosis not present

## 2017-08-12 DIAGNOSIS — Z794 Long term (current) use of insulin: Secondary | ICD-10-CM | POA: Diagnosis not present

## 2017-08-12 DIAGNOSIS — E7800 Pure hypercholesterolemia, unspecified: Secondary | ICD-10-CM | POA: Diagnosis not present

## 2017-08-12 LAB — COMPREHENSIVE METABOLIC PANEL
ALK PHOS: 65 U/L (ref 38–126)
ALT: 22 U/L (ref 17–63)
AST: 29 U/L (ref 15–41)
Albumin: 4.1 g/dL (ref 3.5–5.0)
Anion gap: 8 (ref 5–15)
BUN: 25 mg/dL — AB (ref 6–20)
CALCIUM: 9.4 mg/dL (ref 8.9–10.3)
CO2: 24 mmol/L (ref 22–32)
CREATININE: 1.3 mg/dL — AB (ref 0.61–1.24)
Chloride: 102 mmol/L (ref 101–111)
GFR calc non Af Amer: 57 mL/min — ABNORMAL LOW (ref >60)
Glucose, Bld: 187 mg/dL — ABNORMAL HIGH (ref 65–99)
Potassium: 4.2 mmol/L (ref 3.5–5.1)
SODIUM: 134 mmol/L — AB (ref 135–145)
Total Bilirubin: 0.7 mg/dL (ref 0.3–1.2)
Total Protein: 7 g/dL (ref 6.5–8.1)

## 2017-08-12 LAB — CBC WITH DIFFERENTIAL/PLATELET
Basophils Absolute: 0 10*3/uL (ref 0.0–0.1)
Basophils Relative: 0 %
Eosinophils Absolute: 0.1 10*3/uL (ref 0.0–0.7)
Eosinophils Relative: 3 %
HCT: 37 % — ABNORMAL LOW (ref 39.0–52.0)
HEMOGLOBIN: 12.6 g/dL — AB (ref 13.0–17.0)
LYMPHS ABS: 1.2 10*3/uL (ref 0.7–4.0)
LYMPHS PCT: 23 %
MCH: 29.6 pg (ref 26.0–34.0)
MCHC: 34.1 g/dL (ref 30.0–36.0)
MCV: 86.9 fL (ref 78.0–100.0)
Monocytes Absolute: 0.6 10*3/uL (ref 0.1–1.0)
Monocytes Relative: 12 %
NEUTROS PCT: 62 %
Neutro Abs: 3.1 10*3/uL (ref 1.7–7.7)
Platelets: 232 10*3/uL (ref 150–400)
RBC: 4.26 MIL/uL (ref 4.22–5.81)
RDW: 14 % (ref 11.5–15.5)
WBC: 5 10*3/uL (ref 4.0–10.5)

## 2017-08-12 LAB — HEMOGLOBIN A1C
HEMOGLOBIN A1C: 8.7 % — AB (ref 4.8–5.6)
Mean Plasma Glucose: 202.99 mg/dL

## 2017-08-15 ENCOUNTER — Ambulatory Visit (HOSPITAL_COMMUNITY): Payer: Federal, State, Local not specified - PPO | Admitting: Certified Registered Nurse Anesthetist

## 2017-08-15 ENCOUNTER — Encounter (HOSPITAL_COMMUNITY)
Admission: RE | Disposition: A | Discharge status: Home / Self Care | Payer: Self-pay | Source: Ambulatory Visit | Attending: General Surgery

## 2017-08-15 ENCOUNTER — Ambulatory Visit (HOSPITAL_COMMUNITY)
Admission: RE | Admit: 2017-08-15 | Discharge: 2017-08-15 | Disposition: A | Discharge status: Home / Self Care | Payer: Federal, State, Local not specified - PPO | Source: Ambulatory Visit | Attending: General Surgery | Admitting: General Surgery

## 2017-08-15 ENCOUNTER — Encounter (HOSPITAL_COMMUNITY): Payer: Self-pay | Admitting: Certified Registered Nurse Anesthetist

## 2017-08-15 DIAGNOSIS — K4090 Unilateral inguinal hernia, without obstruction or gangrene, not specified as recurrent: Secondary | ICD-10-CM

## 2017-08-15 DIAGNOSIS — Z794 Long term (current) use of insulin: Secondary | ICD-10-CM | POA: Insufficient documentation

## 2017-08-15 DIAGNOSIS — I2510 Atherosclerotic heart disease of native coronary artery without angina pectoris: Secondary | ICD-10-CM | POA: Insufficient documentation

## 2017-08-15 DIAGNOSIS — I252 Old myocardial infarction: Secondary | ICD-10-CM | POA: Insufficient documentation

## 2017-08-15 DIAGNOSIS — I10 Essential (primary) hypertension: Secondary | ICD-10-CM | POA: Insufficient documentation

## 2017-08-15 DIAGNOSIS — E119 Type 2 diabetes mellitus without complications: Secondary | ICD-10-CM | POA: Insufficient documentation

## 2017-08-15 DIAGNOSIS — Z955 Presence of coronary angioplasty implant and graft: Secondary | ICD-10-CM | POA: Insufficient documentation

## 2017-08-15 DIAGNOSIS — Z7982 Long term (current) use of aspirin: Secondary | ICD-10-CM | POA: Insufficient documentation

## 2017-08-15 DIAGNOSIS — Z79899 Other long term (current) drug therapy: Secondary | ICD-10-CM | POA: Insufficient documentation

## 2017-08-15 DIAGNOSIS — E7800 Pure hypercholesterolemia, unspecified: Secondary | ICD-10-CM | POA: Insufficient documentation

## 2017-08-15 HISTORY — PX: INGUINAL HERNIA REPAIR: SHX194

## 2017-08-15 LAB — GLUCOSE, CAPILLARY
GLUCOSE-CAPILLARY: 203 mg/dL — AB (ref 65–99)
Glucose-Capillary: 180 mg/dL — ABNORMAL HIGH (ref 65–99)
Glucose-Capillary: 210 mg/dL — ABNORMAL HIGH (ref 65–99)

## 2017-08-15 SURGERY — REPAIR, HERNIA, INGUINAL, ADULT
Anesthesia: General | Site: Inguinal | Laterality: Left

## 2017-08-15 MED ORDER — CEFAZOLIN SODIUM-DEXTROSE 2-4 GM/100ML-% IV SOLN
INTRAVENOUS | Status: AC
  Filled 2017-08-15: qty 100

## 2017-08-15 MED ORDER — LACTATED RINGERS IV SOLN
INTRAVENOUS | Status: DC
  Administered 2017-08-15 (×2): via INTRAVENOUS

## 2017-08-15 MED ORDER — BUPIVACAINE LIPOSOME 1.3 % IJ SUSP
INTRAMUSCULAR | Status: AC
  Filled 2017-08-15: qty 20

## 2017-08-15 MED ORDER — CHLORHEXIDINE GLUCONATE CLOTH 2 % EX PADS: 6.0000 | MEDICATED_PAD | Freq: Once | CUTANEOUS | Status: DC

## 2017-08-15 MED ORDER — MIDAZOLAM HCL 2 MG/2ML IJ SOLN
1.0000 mg | INTRAMUSCULAR | Status: AC
  Administered 2017-08-15: 2 mg via INTRAVENOUS

## 2017-08-15 MED ORDER — EPHEDRINE SULFATE 50 MG/ML IJ SOLN
INTRAMUSCULAR | Status: AC
  Filled 2017-08-15: qty 1

## 2017-08-15 MED ORDER — 0.9 % SODIUM CHLORIDE (POUR BTL) OPTIME
TOPICAL | Status: DC | PRN
  Administered 2017-08-15: 1000 mL

## 2017-08-15 MED ORDER — PROPOFOL 10 MG/ML IV BOLUS
INTRAVENOUS | Status: AC
  Filled 2017-08-15: qty 20

## 2017-08-15 MED ORDER — EPHEDRINE SULFATE 50 MG/ML IJ SOLN
INTRAMUSCULAR | Status: DC | PRN
  Administered 2017-08-15: 10 mg via INTRAVENOUS
  Administered 2017-08-15: 5 mg via INTRAVENOUS

## 2017-08-15 MED ORDER — MIDAZOLAM HCL 2 MG/2ML IJ SOLN
INTRAMUSCULAR | Status: AC
  Filled 2017-08-15: qty 2

## 2017-08-15 MED ORDER — OXYCODONE-ACETAMINOPHEN 5-325 MG PO TABS
1.0000 | ORAL_TABLET | ORAL | 0 refills | Status: DC | PRN
Start: 2017-08-15 — End: 2017-11-10

## 2017-08-15 MED ORDER — FENTANYL CITRATE (PF) 100 MCG/2ML IJ SOLN: 25.0000 ug | INTRAMUSCULAR | Status: DC | PRN

## 2017-08-15 MED ORDER — KETOROLAC TROMETHAMINE 30 MG/ML IJ SOLN
INTRAMUSCULAR | Status: AC
  Filled 2017-08-15: qty 1

## 2017-08-15 MED ORDER — LIDOCAINE HCL (PF) 1 % IJ SOLN
INTRAMUSCULAR | Status: DC | PRN
  Administered 2017-08-15: 50 mg

## 2017-08-15 MED ORDER — FENTANYL CITRATE (PF) 100 MCG/2ML IJ SOLN
INTRAMUSCULAR | Status: DC | PRN
  Administered 2017-08-15 (×3): 50 ug via INTRAVENOUS

## 2017-08-15 MED ORDER — BUPIVACAINE LIPOSOME 1.3 % IJ SUSP
INTRAMUSCULAR | Status: DC | PRN
  Administered 2017-08-15: 18 mL

## 2017-08-15 MED ORDER — FENTANYL CITRATE (PF) 100 MCG/2ML IJ SOLN
INTRAMUSCULAR | Status: AC
  Filled 2017-08-15: qty 2

## 2017-08-15 MED ORDER — LIDOCAINE HCL (PF) 1 % IJ SOLN
INTRAMUSCULAR | Status: AC
  Filled 2017-08-15: qty 5

## 2017-08-15 MED ORDER — FENTANYL CITRATE (PF) 100 MCG/2ML IJ SOLN
INTRAMUSCULAR | Status: AC
Start: 2017-08-15 — End: 2017-08-15
  Filled 2017-08-15: qty 2

## 2017-08-15 MED ORDER — CEFAZOLIN SODIUM-DEXTROSE 2-4 GM/100ML-% IV SOLN
2.0000 g | INTRAVENOUS | Status: AC
  Administered 2017-08-15: 2 g via INTRAVENOUS

## 2017-08-15 MED ORDER — PROPOFOL 10 MG/ML IV BOLUS
INTRAVENOUS | Status: DC | PRN
  Administered 2017-08-15: 150 mg via INTRAVENOUS

## 2017-08-15 MED ORDER — KETOROLAC TROMETHAMINE 30 MG/ML IJ SOLN
30.0000 mg | Freq: Once | INTRAMUSCULAR | Status: AC
  Administered 2017-08-15: 30 mg via INTRAVENOUS

## 2017-08-15 SURGICAL SUPPLY — 36 items
ADH SKN CLS APL DERMABOND .7 (GAUZE/BANDAGES/DRESSINGS) ×1
BAG HAMPER (MISCELLANEOUS) ×3 IMPLANT
CLOTH BEACON ORANGE TIMEOUT ST (SAFETY) ×3 IMPLANT
COVER LIGHT HANDLE STERIS (MISCELLANEOUS) ×6 IMPLANT
DERMABOND ADVANCED (GAUZE/BANDAGES/DRESSINGS) ×2
DERMABOND ADVANCED .7 DNX12 (GAUZE/BANDAGES/DRESSINGS) ×1 IMPLANT
DRAIN PENROSE 18X1/2 LTX STRL (DRAIN) ×3 IMPLANT
ELECT REM PT RETURN 9FT ADLT (ELECTROSURGICAL) ×3
ELECTRODE REM PT RTRN 9FT ADLT (ELECTROSURGICAL) ×1 IMPLANT
GLOVE BIO SURGEON STRL SZ 6.5 (GLOVE) ×1 IMPLANT
GLOVE BIO SURGEON STRL SZ7 (GLOVE) ×4 IMPLANT
GLOVE BIO SURGEONS STRL SZ 6.5 (GLOVE) ×1
GLOVE BIOGEL PI IND STRL 7.0 (GLOVE) ×2 IMPLANT
GLOVE BIOGEL PI INDICATOR 7.0 (GLOVE) ×4
GLOVE SURG SS PI 7.5 STRL IVOR (GLOVE) ×3 IMPLANT
GOWN STRL REUS W/ TWL XL LVL3 (GOWN DISPOSABLE) ×1 IMPLANT
GOWN STRL REUS W/TWL LRG LVL3 (GOWN DISPOSABLE) ×6 IMPLANT
GOWN STRL REUS W/TWL XL LVL3 (GOWN DISPOSABLE) ×3
INST SET MINOR GENERAL (KITS) ×3 IMPLANT
KIT ROOM TURNOVER APOR (KITS) ×3 IMPLANT
MANIFOLD NEPTUNE II (INSTRUMENTS) ×3 IMPLANT
MESH MARLEX PLUG MEDIUM (Mesh General) ×2 IMPLANT
NDL HYPO 21X1.5 SAFETY (NEEDLE) ×1 IMPLANT
NEEDLE HYPO 21X1.5 SAFETY (NEEDLE) ×3 IMPLANT
NS IRRIG 1000ML POUR BTL (IV SOLUTION) ×3 IMPLANT
PACK MINOR (CUSTOM PROCEDURE TRAY) ×3 IMPLANT
PAD ARMBOARD 7.5X6 YLW CONV (MISCELLANEOUS) ×3 IMPLANT
SET BASIN LINEN APH (SET/KITS/TRAYS/PACK) ×3 IMPLANT
SUT NOVA NAB GS-22 2 2-0 T-19 (SUTURE) ×4 IMPLANT
SUT VIC AB 2-0 CT1 27 (SUTURE) ×3
SUT VIC AB 2-0 CT1 TAPERPNT 27 (SUTURE) ×1 IMPLANT
SUT VIC AB 3-0 SH 27 (SUTURE) ×3
SUT VIC AB 3-0 SH 27X BRD (SUTURE) ×1 IMPLANT
SUT VIC AB 4-0 PS2 27 (SUTURE) ×3 IMPLANT
SUT VICRYL AB 3 0 TIES (SUTURE) ×2 IMPLANT
SYR 20CC LL (SYRINGE) ×3 IMPLANT

## 2017-08-15 NOTE — Anesthesia Preprocedure Evaluation (Signed)
Anesthesia Evaluation  Patient identified by MRN, date of birth, ID band Patient awake    Reviewed: Allergy & Precautions, NPO status , Patient's Chart, lab work & pertinent test results, reviewed documented beta blocker date and time   Airway Mallampati: I  TM Distance: >3 FB Neck ROM: Full    Dental  (+) Teeth Intact   Pulmonary neg pulmonary ROS,    Pulmonary exam normal        Cardiovascular hypertension, Pt. on medications and Pt. on home beta blockers + CAD, + Past MI (9 years ago, now no CP, SOB, stable >64mets) and + Cardiac Stents  Normal cardiovascular exam Rhythm:Regular Rate:Normal     Neuro/Psych    GI/Hepatic negative GI ROS, Neg liver ROS,   Endo/Other  diabetes, Well Controlled, Type 2, Insulin Dependent, Oral Hypoglycemic Agents  Renal/GU negative Renal ROS     Musculoskeletal  (+) Arthritis , Osteoarthritis,    Abdominal Normal abdominal exam  (+)   Peds  Hematology  (+) anemia ,   Anesthesia Other Findings   Reproductive/Obstetrics                             Anesthesia Physical Anesthesia Plan  ASA: III  Anesthesia Plan: General   Post-op Pain Management:    Induction: Intravenous  PONV Risk Score and Plan:   Airway Management Planned: LMA  Additional Equipment:   Intra-op Plan:   Post-operative Plan: Extubation in OR  Informed Consent: I have reviewed the patients History and Physical, chart, labs and discussed the procedure including the risks, benefits and alternatives for the proposed anesthesia with the patient or authorized representative who has indicated his/her understanding and acceptance.     Plan Discussed with:   Anesthesia Plan Comments:         Anesthesia Quick Evaluation

## 2017-08-15 NOTE — Op Note (Signed)
Patient:  Raymond [name]  DOB:  [birth year]  MRN:  [id number]   Preop Diagnosis: Left inguinal hernia  Postop Diagnosis: Same  Procedure: Left inguinal herniorrhaphy with mesh  Surgeon: Aviva Signs, MD  Assistant: Blake Divine, MD  Anes: General  Indications: Patient is a 61 year old white male who presents with a symptomatic left inguinal hernia.  The risks and benefits of the procedure including bleeding, infection, mesh use, and the possibility of recurrence of the hernia were fully explained to the patient, who gave informed consent.  Procedure note: The patient was placed in supine position.  After general anesthesia was administered, the left groin region was prepped and draped using the usual sterile technique with [employer]are.  Surgical site confirmation was performed.  Incision was made in the left groin region down to the external oblique capabilities.  The process was incised to the external ring.  The Penrose drain was placed around the spermatic cord.  The vas deferens was noted to be within the spermatic cord.  The patient had what appeared to be a lipoma of the cord which was probably his source of pain.  A high ligation of this was performed and it was inverted.  On inspection of the spermatic cord, no other hernia sac was noted.  The floor was within normal limits.  A medium sized Bard PerFix plug was then inserted into the internal ring and an onlay patch was placed along the floor of the inguinal canal and secured superiorly to the conjoined tendon and inferiorly to the shelving edge of Poupart's ligament using 2-0 Novafil interrupted sutures.  The internal ring was re-created using a 2-0 Novafil interrupted suture.  The external oblique paper neuroses was reapproximated using a 2-0 Vicryl running suture.  Subcutaneous layer was reapproximated using 3-0 Vicryl interrupted suture.  Exparel was instilled into the surrounding wound.  The skin was closed using a 4-0 Vicryl  subcuticular suture.  Dermabond was applied.  All tape needle counts were correct at the end of the procedure.  The patient was awakened and transferred to PACU in stable condition.  Complications: None  EBL: Minimal  Specimen: None

## 2017-08-15 NOTE — Interval H&P Note (Signed)
History and Physical Interval Note:  08/15/2017 9:54 AM  Raymond [name]  has presented today for surgery, with the diagnosis of left inguinal hernia  The various methods of treatment have been discussed with the patient and family. After consideration of risks, benefits and other options for treatment, the patient has consented to  Procedure(s): HERNIA REPAIR INGUINAL ADULT WITH MESH (Left) as a surgical intervention .  The patient's history has been reviewed, patient examined, no change in status, stable for surgery.  I have reviewed the patient's chart and labs.  Questions were answered to the patient's satisfaction.     Aviva Signs

## 2017-08-15 NOTE — Anesthesia Procedure Notes (Signed)
Procedure Name: LMA Insertion Date/Time: 08/15/2017 10:40 AM Performed by: Raenette Rover, CRNA Pre-anesthesia Checklist: Patient identified, Emergency Drugs available, Suction available and Patient being monitored Patient Re-evaluated:Patient Re-evaluated prior to induction Oxygen Delivery Method: Circle system utilized Preoxygenation: Pre-oxygenation with 100% oxygen Induction Type: IV induction LMA: LMA inserted LMA Size: 5.0 Number of attempts: 1 Placement Confirmation: positive ETCO2,  CO2 detector and breath sounds checked- equal and bilateral Tube secured with: Tape Dental Injury: Teeth and Oropharynx as per pre-operative assessment

## 2017-08-15 NOTE — Anesthesia Postprocedure Evaluation (Signed)
Anesthesia Post Note  Patient: Raymond [name]  Procedure(s) Performed: HERNIA REPAIR INGUINAL ADULT WITH MESH (Left Inguinal)  Patient location during evaluation: PACU Anesthesia Type: General Level of consciousness: awake, awake and alert, oriented and patient cooperative Pain management: pain level controlled Vital Signs Assessment: post-procedure vital signs reviewed and stable Respiratory status: spontaneous breathing, nonlabored ventilation and respiratory function stable Cardiovascular status: stable Postop Assessment: no apparent nausea or vomiting Anesthetic complications: no     Last Vitals:  Vitals:   08/15/17 1132  Temp: 36.9 C    Last Pain:  Vitals:   08/15/17 1132  PainSc: Asleep                 Gerasimos Plotts L

## 2017-08-15 NOTE — Transfer of Care (Signed)
Immediate Anesthesia Transfer of Care Note  Patient: Raymond [name]  Procedure(s) Performed: HERNIA REPAIR INGUINAL ADULT WITH MESH (Left Inguinal)  Patient Location: PACU  Anesthesia Type:General  Level of Consciousness: awake, alert , oriented, drowsy and patient cooperative  Airway & Oxygen Therapy: Patient Spontanous Breathing  Post-op Assessment: Report given to RN and Post -op Vital signs reviewed and stable  Post vital signs: Reviewed and stable  Last Vitals: There were no vitals filed for this visit.  Last Pain:  Vitals:   08/15/17 0903  PainSc: 4       Patients Stated Pain Goal: 5 (48/59/27 [id number])  Complications: No apparent anesthesia complications

## 2017-08-15 NOTE — Discharge Instructions (Signed)
Open Hernia Repair, Adult, Care After °These instructions give you information about caring for yourself after your procedure. Your doctor may also give you more specific instructions. If you have problems or questions, contact your doctor. °Follow these instructions at home: °Surgical cut (incision) care  ° °· Follow instructions from your doctor about how to take care of your surgical cut area. Make sure you: °¨ Wash your hands with soap and water before you change your bandage (dressing). If you cannot use soap and water, use hand sanitizer. °¨ Change your bandage as told by your doctor. °¨ Leave stitches (sutures), skin glue, or skin tape (adhesive) strips in place. They may need to stay in place for 2 weeks or longer. If tape strips get loose and curl up, you may trim the loose edges. Do not remove tape strips completely unless your doctor says it is okay. °· Check your surgical cut every day for signs of infection. Check for: °¨ More redness, swelling, or pain. °¨ More fluid or blood. °¨ Warmth. °¨ Pus or a bad smell. °Activity  °· Do not drive or use heavy machinery while taking prescription pain medicine. Do not drive until your doctor says it is okay. °· Until your doctor says it is okay: °¨ Do not lift anything that is heavier than 10 lb (4.5 kg). °¨ Do not play contact sports. °· Return to your normal activities as told by your doctor. Ask your doctor what activities are safe. °General instructions  °· To prevent or treat having a hard time pooping (constipation) while you are taking prescription pain medicine, your doctor may recommend that you: °¨ Drink enough fluid to keep your pee (urine) clear or pale yellow. °¨ Take over-the-counter or prescription medicines. °¨ Eat foods that are high in fiber, such as fresh fruits and vegetables, whole grains, and beans. °¨ Limit foods that are high in fat and processed sugars, such as fried and sweet foods. °· Take over-the-counter and prescription medicines only  as told by your doctor. °· Do not take baths, swim, or use a hot tub until your doctor says it is okay. °· Keep all follow-up visits as told by your doctor. This is important. °Contact a doctor if: °· You develop a rash. °· You have more redness, swelling, or pain around your surgical cut. °· You have more fluid or blood coming from your surgical cut. °· Your surgical cut feels warm to the touch. °· You have pus or a bad smell coming from your surgical cut. °· You have a fever or chills. °· You have blood in your poop (stool). °· You have not pooped in 2-3 days. °· Medicine does not help your pain. °Get help right away if: °· You have chest pain or you are short of breath. °· You feel light-headed. °· You feel weak and dizzy (feel faint). °· You have very bad pain. °· You throw up (vomit) and your pain is worse. °This information is not intended to replace advice given to you by your health care provider. Make sure you discuss any questions you have with your health care provider. °Document Released: 10/04/2014 Document Revised: 04/02/2016 Document Reviewed: 02/25/2016 °Elsevier Interactive Patient Education © 2017 [employer] ° °

## 2017-08-16 ENCOUNTER — Encounter (HOSPITAL_COMMUNITY): Payer: Self-pay | Admitting: General Surgery

## 2017-08-17 ENCOUNTER — Encounter (HOSPITAL_COMMUNITY): Payer: Self-pay | Admitting: General Surgery

## 2017-08-25 ENCOUNTER — Ambulatory Visit (INDEPENDENT_AMBULATORY_CARE_PROVIDER_SITE_OTHER): Payer: Self-pay | Admitting: General Surgery

## 2017-08-25 ENCOUNTER — Encounter: Payer: Self-pay | Admitting: General Surgery

## 2017-08-25 VITALS — BP 125/56 | HR 45 | Temp 97.8°F | Resp 18 | Ht 73.0 in | Wt 209.0 lb

## 2017-08-25 DIAGNOSIS — Z09 Encounter for follow-up examination after completed treatment for conditions other than malignant neoplasm: Secondary | ICD-10-CM

## 2017-08-25 NOTE — Progress Notes (Signed)
Subjective:     Raymond [name]  Status post left inguinal herniorrhaphy with mesh.  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  His pain level is 3 out of 10.  No fever or chills have been noted. Objective:    BP (!) 125/56   Pulse (!) 45   Temp 97.8 F (36.6 C)   Resp 18   Ht 6\' 1"  (1.854 m)   Wt 209 lb (94.8 kg)   BMI 27.57 kg/m   General:  alert, cooperative and no distress  Abdomen is soft, incision healing well.     Assessment:    Doing well postoperatively.    Plan:   Increase activity as able.  Follow-up here as needed.

## 2017-08-31 ENCOUNTER — Ambulatory Visit (INDEPENDENT_AMBULATORY_CARE_PROVIDER_SITE_OTHER): Payer: Federal, State, Local not specified - PPO | Admitting: Urology

## 2017-08-31 DIAGNOSIS — C672 Malignant neoplasm of lateral wall of bladder: Secondary | ICD-10-CM

## 2017-09-28 DIAGNOSIS — E118 Type 2 diabetes mellitus with unspecified complications: Secondary | ICD-10-CM | POA: Diagnosis not present

## 2017-09-28 DIAGNOSIS — I10 Essential (primary) hypertension: Secondary | ICD-10-CM | POA: Diagnosis not present

## 2017-10-03 DIAGNOSIS — M25512 Pain in left shoulder: Secondary | ICD-10-CM | POA: Diagnosis not present

## 2017-10-03 DIAGNOSIS — E118 Type 2 diabetes mellitus with unspecified complications: Secondary | ICD-10-CM | POA: Diagnosis not present

## 2017-10-03 DIAGNOSIS — I10 Essential (primary) hypertension: Secondary | ICD-10-CM | POA: Diagnosis not present

## 2017-10-03 DIAGNOSIS — Z0000 Encounter for general adult medical examination without abnormal findings: Secondary | ICD-10-CM | POA: Diagnosis not present

## 2017-10-03 DIAGNOSIS — E789 Disorder of lipoprotein metabolism, unspecified: Secondary | ICD-10-CM | POA: Diagnosis not present

## 2017-10-10 DIAGNOSIS — M7582 Other shoulder lesions, left shoulder: Secondary | ICD-10-CM | POA: Diagnosis not present

## 2017-10-10 DIAGNOSIS — M25512 Pain in left shoulder: Secondary | ICD-10-CM | POA: Diagnosis not present

## 2017-10-26 ENCOUNTER — Ambulatory Visit (INDEPENDENT_AMBULATORY_CARE_PROVIDER_SITE_OTHER): Payer: Federal, State, Local not specified - PPO | Admitting: Urology

## 2017-10-26 DIAGNOSIS — R338 Other retention of urine: Secondary | ICD-10-CM

## 2017-10-31 DIAGNOSIS — N390 Urinary tract infection, site not specified: Secondary | ICD-10-CM | POA: Diagnosis not present

## 2017-10-31 DIAGNOSIS — B9620 Unspecified Escherichia coli [E. coli] as the cause of diseases classified elsewhere: Secondary | ICD-10-CM | POA: Diagnosis not present

## 2017-11-09 ENCOUNTER — Ambulatory Visit (INDEPENDENT_AMBULATORY_CARE_PROVIDER_SITE_OTHER): Payer: Federal, State, Local not specified - PPO | Admitting: Urology

## 2017-11-09 DIAGNOSIS — R338 Other retention of urine: Secondary | ICD-10-CM

## 2017-11-10 ENCOUNTER — Other Ambulatory Visit: Payer: Self-pay

## 2017-11-10 ENCOUNTER — Ambulatory Visit (INDEPENDENT_AMBULATORY_CARE_PROVIDER_SITE_OTHER): Payer: Federal, State, Local not specified - PPO | Admitting: Family Medicine

## 2017-11-10 ENCOUNTER — Inpatient Hospital Stay (HOSPITAL_COMMUNITY)
Admission: EM | Admit: 2017-11-10 | Discharge: 2017-12-26 | DRG: 097 | Disposition: E | Payer: Federal, State, Local not specified - PPO | Attending: Pulmonary Disease | Admitting: Pulmonary Disease

## 2017-11-10 ENCOUNTER — Emergency Department (HOSPITAL_COMMUNITY): Payer: Federal, State, Local not specified - PPO

## 2017-11-10 ENCOUNTER — Encounter (HOSPITAL_COMMUNITY): Payer: Self-pay | Admitting: Emergency Medicine

## 2017-11-10 ENCOUNTER — Encounter: Payer: Self-pay | Admitting: Family Medicine

## 2017-11-10 VITALS — BP 120/70 | HR 88 | Temp 97.8°F | Resp 16 | Ht 73.0 in | Wt 182.0 lb

## 2017-11-10 DIAGNOSIS — E119 Type 2 diabetes mellitus without complications: Secondary | ICD-10-CM | POA: Diagnosis present

## 2017-11-10 DIAGNOSIS — N401 Enlarged prostate with lower urinary tract symptoms: Secondary | ICD-10-CM | POA: Diagnosis present

## 2017-11-10 DIAGNOSIS — I10 Essential (primary) hypertension: Secondary | ICD-10-CM | POA: Diagnosis present

## 2017-11-10 DIAGNOSIS — R159 Full incontinence of feces: Secondary | ICD-10-CM | POA: Diagnosis not present

## 2017-11-10 DIAGNOSIS — I2510 Atherosclerotic heart disease of native coronary artery without angina pectoris: Secondary | ICD-10-CM | POA: Diagnosis present

## 2017-11-10 DIAGNOSIS — E873 Alkalosis: Secondary | ICD-10-CM | POA: Diagnosis not present

## 2017-11-10 DIAGNOSIS — E7800 Pure hypercholesterolemia, unspecified: Secondary | ICD-10-CM | POA: Diagnosis present

## 2017-11-10 DIAGNOSIS — Z7902 Long term (current) use of antithrombotics/antiplatelets: Secondary | ICD-10-CM

## 2017-11-10 DIAGNOSIS — G610 Guillain-Barre syndrome: Secondary | ICD-10-CM | POA: Diagnosis present

## 2017-11-10 DIAGNOSIS — Z79899 Other long term (current) drug therapy: Secondary | ICD-10-CM

## 2017-11-10 DIAGNOSIS — Z794 Long term (current) use of insulin: Secondary | ICD-10-CM

## 2017-11-10 DIAGNOSIS — J9601 Acute respiratory failure with hypoxia: Secondary | ICD-10-CM

## 2017-11-10 DIAGNOSIS — D649 Anemia, unspecified: Secondary | ICD-10-CM | POA: Diagnosis present

## 2017-11-10 DIAGNOSIS — R202 Paresthesia of skin: Secondary | ICD-10-CM | POA: Diagnosis not present

## 2017-11-10 DIAGNOSIS — R231 Pallor: Secondary | ICD-10-CM | POA: Diagnosis not present

## 2017-11-10 DIAGNOSIS — L8990 Pressure ulcer of unspecified site, unspecified stage: Secondary | ICD-10-CM

## 2017-11-10 DIAGNOSIS — R1030 Lower abdominal pain, unspecified: Secondary | ICD-10-CM | POA: Diagnosis not present

## 2017-11-10 DIAGNOSIS — Z955 Presence of coronary angioplasty implant and graft: Secondary | ICD-10-CM

## 2017-11-10 DIAGNOSIS — R569 Unspecified convulsions: Secondary | ICD-10-CM | POA: Diagnosis not present

## 2017-11-10 DIAGNOSIS — I252 Old myocardial infarction: Secondary | ICD-10-CM

## 2017-11-10 DIAGNOSIS — R1010 Upper abdominal pain, unspecified: Secondary | ICD-10-CM

## 2017-11-10 DIAGNOSIS — R338 Other retention of urine: Secondary | ICD-10-CM | POA: Diagnosis present

## 2017-11-10 DIAGNOSIS — G038 Meningitis due to other specified causes: Principal | ICD-10-CM | POA: Diagnosis present

## 2017-11-10 DIAGNOSIS — A419 Sepsis, unspecified organism: Secondary | ICD-10-CM

## 2017-11-10 DIAGNOSIS — R197 Diarrhea, unspecified: Secondary | ICD-10-CM | POA: Diagnosis not present

## 2017-11-10 DIAGNOSIS — R634 Abnormal weight loss: Secondary | ICD-10-CM

## 2017-11-10 DIAGNOSIS — C679 Malignant neoplasm of bladder, unspecified: Secondary | ICD-10-CM | POA: Diagnosis not present

## 2017-11-10 DIAGNOSIS — Z66 Do not resuscitate: Secondary | ICD-10-CM | POA: Diagnosis present

## 2017-11-10 DIAGNOSIS — G910 Communicating hydrocephalus: Secondary | ICD-10-CM | POA: Diagnosis present

## 2017-11-10 DIAGNOSIS — D630 Anemia in neoplastic disease: Secondary | ICD-10-CM | POA: Diagnosis present

## 2017-11-10 DIAGNOSIS — R9401 Abnormal electroencephalogram [EEG]: Secondary | ICD-10-CM | POA: Diagnosis not present

## 2017-11-10 DIAGNOSIS — M1990 Unspecified osteoarthritis, unspecified site: Secondary | ICD-10-CM | POA: Diagnosis present

## 2017-11-10 DIAGNOSIS — G4700 Insomnia, unspecified: Secondary | ICD-10-CM | POA: Diagnosis present

## 2017-11-10 DIAGNOSIS — Z7189 Other specified counseling: Secondary | ICD-10-CM

## 2017-11-10 DIAGNOSIS — L89151 Pressure ulcer of sacral region, stage 1: Secondary | ICD-10-CM | POA: Diagnosis not present

## 2017-11-10 DIAGNOSIS — C801 Malignant (primary) neoplasm, unspecified: Secondary | ICD-10-CM

## 2017-11-10 DIAGNOSIS — Z8551 Personal history of malignant neoplasm of bladder: Secondary | ICD-10-CM

## 2017-11-10 DIAGNOSIS — J9690 Respiratory failure, unspecified, unspecified whether with hypoxia or hypercapnia: Secondary | ICD-10-CM

## 2017-11-10 DIAGNOSIS — R531 Weakness: Secondary | ICD-10-CM | POA: Diagnosis not present

## 2017-11-10 DIAGNOSIS — Z515 Encounter for palliative care: Secondary | ICD-10-CM

## 2017-11-10 DIAGNOSIS — N390 Urinary tract infection, site not specified: Secondary | ICD-10-CM | POA: Diagnosis not present

## 2017-11-10 DIAGNOSIS — Z978 Presence of other specified devices: Secondary | ICD-10-CM | POA: Diagnosis present

## 2017-11-10 DIAGNOSIS — Z4659 Encounter for fitting and adjustment of other gastrointestinal appliance and device: Secondary | ICD-10-CM

## 2017-11-10 DIAGNOSIS — C7949 Secondary malignant neoplasm of other parts of nervous system: Secondary | ICD-10-CM

## 2017-11-10 DIAGNOSIS — B965 Pseudomonas (aeruginosa) (mallei) (pseudomallei) as the cause of diseases classified elsewhere: Secondary | ICD-10-CM | POA: Diagnosis not present

## 2017-11-10 DIAGNOSIS — Z7982 Long term (current) use of aspirin: Secondary | ICD-10-CM

## 2017-11-10 DIAGNOSIS — R1310 Dysphagia, unspecified: Secondary | ICD-10-CM | POA: Diagnosis not present

## 2017-11-10 DIAGNOSIS — E871 Hypo-osmolality and hyponatremia: Secondary | ICD-10-CM | POA: Diagnosis not present

## 2017-11-10 DIAGNOSIS — E43 Unspecified severe protein-calorie malnutrition: Secondary | ICD-10-CM

## 2017-11-10 DIAGNOSIS — J690 Pneumonitis due to inhalation of food and vomit: Secondary | ICD-10-CM

## 2017-11-10 DIAGNOSIS — R404 Transient alteration of awareness: Secondary | ICD-10-CM | POA: Diagnosis not present

## 2017-11-10 DIAGNOSIS — I472 Ventricular tachycardia: Secondary | ICD-10-CM | POA: Diagnosis not present

## 2017-11-10 DIAGNOSIS — R296 Repeated falls: Secondary | ICD-10-CM | POA: Diagnosis present

## 2017-11-10 DIAGNOSIS — R0602 Shortness of breath: Secondary | ICD-10-CM

## 2017-11-10 DIAGNOSIS — M5127 Other intervertebral disc displacement, lumbosacral region: Secondary | ICD-10-CM | POA: Diagnosis present

## 2017-11-10 DIAGNOSIS — Z6821 Body mass index (BMI) 21.0-21.9, adult: Secondary | ICD-10-CM

## 2017-11-10 DIAGNOSIS — G9341 Metabolic encephalopathy: Secondary | ICD-10-CM | POA: Diagnosis present

## 2017-11-10 DIAGNOSIS — Z833 Family history of diabetes mellitus: Secondary | ICD-10-CM

## 2017-11-10 LAB — COMPREHENSIVE METABOLIC PANEL
ALBUMIN: 3.9 g/dL (ref 3.5–5.0)
ALT: 32 U/L (ref 17–63)
ANION GAP: 13 (ref 5–15)
AST: 28 U/L (ref 15–41)
Alkaline Phosphatase: 56 U/L (ref 38–126)
BUN: 24 mg/dL — ABNORMAL HIGH (ref 6–20)
CHLORIDE: 85 mmol/L — AB (ref 101–111)
CO2: 24 mmol/L (ref 22–32)
Calcium: 9.3 mg/dL (ref 8.9–10.3)
Creatinine, Ser: 0.91 mg/dL (ref 0.61–1.24)
GFR calc Af Amer: >60 mL/min (ref >60)
GFR calc non Af Amer: >60 mL/min (ref >60)
Glucose, Bld: 305 mg/dL — ABNORMAL HIGH (ref 65–99)
Potassium: 4.7 mmol/L (ref 3.5–5.1)
SODIUM: 122 mmol/L — AB (ref 135–145)
Total Bilirubin: 0.8 mg/dL (ref 0.3–1.2)
Total Protein: 7.4 g/dL (ref 6.5–8.1)

## 2017-11-10 LAB — CBC WITH DIFFERENTIAL/PLATELET
BASOS PCT: 0 %
Basophils Absolute: 0 10*3/uL (ref 0.0–0.1)
EOS ABS: 0 10*3/uL (ref 0.0–0.7)
EOS PCT: 0 %
HCT: 39.9 % (ref 39.0–52.0)
Hemoglobin: 13.7 g/dL (ref 13.0–17.0)
LYMPHS ABS: 0.7 10*3/uL (ref 0.7–4.0)
Lymphocytes Relative: 5 %
MCH: 28.7 pg (ref 26.0–34.0)
MCHC: 34.3 g/dL (ref 30.0–36.0)
MCV: 83.6 fL (ref 78.0–100.0)
Monocytes Absolute: 1 10*3/uL (ref 0.1–1.0)
Monocytes Relative: 8 %
Neutro Abs: 11.2 10*3/uL — ABNORMAL HIGH (ref 1.7–7.7)
Neutrophils Relative %: 87 %
PLATELETS: 326 10*3/uL (ref 150–400)
RBC: 4.77 MIL/uL (ref 4.22–5.81)
RDW: 13.3 % (ref 11.5–15.5)
WBC: 12.9 10*3/uL — AB (ref 4.0–10.5)

## 2017-11-10 LAB — URINALYSIS, ROUTINE W REFLEX MICROSCOPIC
BILIRUBIN URINE: NEGATIVE
Bacteria, UA: NONE SEEN
Glucose, UA: >=500 mg/dL — AB
HGB URINE DIPSTICK: NEGATIVE
KETONES UR: 20 mg/dL — AB
LEUKOCYTES UA: NEGATIVE
NITRITE: NEGATIVE
PROTEIN: NEGATIVE mg/dL
Specific Gravity, Urine: 1.021 (ref 1.005–1.030)
Squamous Epithelial / LPF: NONE SEEN
WBC UA: NONE SEEN WBC/hpf (ref 0–5)
pH: 5 (ref 5.0–8.0)

## 2017-11-10 LAB — LIPASE, BLOOD: Lipase: 44 U/L (ref 11–51)

## 2017-11-10 MED ORDER — ENOXAPARIN SODIUM 40 MG/0.4ML ~~LOC~~ SOLN
40.0000 mg | SUBCUTANEOUS | Status: DC
  Administered 2017-11-10 – 2017-11-21 (×12): 40 mg via SUBCUTANEOUS
  Filled 2017-11-10 (×12): qty 0.4

## 2017-11-10 MED ORDER — SODIUM CHLORIDE 0.9 % IV SOLN
INTRAVENOUS | Status: DC
  Administered 2017-11-10 – 2017-11-15 (×8): via INTRAVENOUS

## 2017-11-10 MED ORDER — IOPAMIDOL (ISOVUE-300) INJECTION 61%
100.0000 mL | Freq: Once | INTRAVENOUS | Status: AC | PRN
  Administered 2017-11-10: 100 mL via INTRAVENOUS

## 2017-11-10 MED ORDER — SODIUM CHLORIDE 0.9 % IV BOLUS (SEPSIS)
500.0000 mL | Freq: Once | INTRAVENOUS | Status: AC
  Administered 2017-11-10: 500 mL via INTRAVENOUS

## 2017-11-10 MED ORDER — METOPROLOL SUCCINATE ER 25 MG PO TB24
25.0000 mg | ORAL_TABLET | Freq: Every day | ORAL | Status: DC
  Administered 2017-11-10 – 2017-11-14 (×5): 25 mg via ORAL
  Filled 2017-11-10 (×5): qty 1

## 2017-11-10 MED ORDER — INSULIN ASPART PROT & ASPART (70-30 MIX) 100 UNIT/ML ~~LOC~~ SUSP
30.0000 [IU] | Freq: Two times a day (BID) | SUBCUTANEOUS | Status: DC
  Administered 2017-11-11 – 2017-11-18 (×11): 30 [IU] via SUBCUTANEOUS
  Filled 2017-11-10 (×3): qty 10

## 2017-11-10 MED ORDER — ONDANSETRON HCL 4 MG PO TABS: 4.0000 mg | ORAL_TABLET | Freq: Four times a day (QID) | ORAL | Status: DC | PRN

## 2017-11-10 MED ORDER — ONDANSETRON HCL 4 MG/2ML IJ SOLN: 4.0000 mg | Freq: Four times a day (QID) | INTRAMUSCULAR | Status: DC | PRN

## 2017-11-10 MED ORDER — ROSUVASTATIN CALCIUM 20 MG PO TABS
40.0000 mg | ORAL_TABLET | Freq: Every day | ORAL | Status: DC
  Administered 2017-11-10 – 2017-11-14 (×5): 40 mg via ORAL
  Filled 2017-11-10 (×5): qty 2

## 2017-11-10 MED ORDER — TAMSULOSIN HCL 0.4 MG PO CAPS
0.4000 mg | ORAL_CAPSULE | Freq: Every day | ORAL | Status: DC
  Administered 2017-11-10 – 2017-11-18 (×8): 0.4 mg via ORAL
  Filled 2017-11-10 (×9): qty 1

## 2017-11-10 MED ORDER — OXYCODONE-ACETAMINOPHEN 5-325 MG PO TABS
1.0000 | ORAL_TABLET | Freq: Four times a day (QID) | ORAL | Status: DC | PRN
  Administered 2017-11-10 – 2017-11-11 (×2): 1 via ORAL
  Administered 2017-11-11: 2 via ORAL
  Administered 2017-11-11: 1 via ORAL
  Administered 2017-11-12 – 2017-11-14 (×5): 2 via ORAL
  Filled 2017-11-10: qty 2
  Filled 2017-11-10 (×2): qty 1
  Filled 2017-11-10: qty 2
  Filled 2017-11-10: qty 1
  Filled 2017-11-10 (×4): qty 2

## 2017-11-10 MED ORDER — INSULIN ASPART 100 UNIT/ML ~~LOC~~ SOLN
0.0000 [IU] | Freq: Three times a day (TID) | SUBCUTANEOUS | Status: DC
  Administered 2017-11-11: 2 [IU] via SUBCUTANEOUS
  Administered 2017-11-11 (×2): 7 [IU] via SUBCUTANEOUS

## 2017-11-10 MED ORDER — ENSURE ENLIVE PO LIQD
237.0000 mL | Freq: Two times a day (BID) | ORAL | Status: DC
  Administered 2017-11-11 (×2): 237 mL via ORAL

## 2017-11-10 MED ORDER — CLOPIDOGREL BISULFATE 75 MG PO TABS
75.0000 mg | ORAL_TABLET | Freq: Every day | ORAL | Status: DC
  Administered 2017-11-11 – 2017-11-14 (×4): 75 mg via ORAL
  Filled 2017-11-10 (×4): qty 1

## 2017-11-10 MED ORDER — TRAZODONE HCL 50 MG PO TABS
50.0000 mg | ORAL_TABLET | Freq: Every evening | ORAL | Status: DC | PRN
  Administered 2017-11-11: 50 mg via ORAL
  Filled 2017-11-10: qty 1

## 2017-11-10 MED ORDER — IRBESARTAN 300 MG PO TABS
300.0000 mg | ORAL_TABLET | Freq: Every day | ORAL | Status: DC
  Administered 2017-11-11 – 2017-11-19 (×9): 300 mg via ORAL
  Filled 2017-11-10 (×9): qty 1

## 2017-11-10 MED ORDER — ASPIRIN 325 MG PO TABS
325.0000 mg | ORAL_TABLET | Freq: Every day | ORAL | Status: DC
  Administered 2017-11-10 – 2017-11-14 (×5): 325 mg via ORAL
  Filled 2017-11-10 (×5): qty 1

## 2017-11-10 MED ORDER — EZETIMIBE 10 MG PO TABS
10.0000 mg | ORAL_TABLET | Freq: Every day | ORAL | Status: DC
  Administered 2017-11-10 – 2017-11-14 (×5): 10 mg via ORAL
  Filled 2017-11-10 (×5): qty 1

## 2017-11-10 NOTE — Progress Notes (Addendum)
Patient ID: Raymond [name], male    DOB: 10/23/1955, 62 y.o.   MRN: [id number]  Chief Complaint  Patient presents with  . Establish Care  . Pain    4/10 now, sometimes way worse  . Numbness  . Fall  . Unsteady    Allergies Patient has no known allergies.  Subjective:   Raymond [name] is a 62 y.o. male who presents to [facility] today.  HPI  [name] is a 62 year old male who presents to the office today as a new patient visit to establish care.  He is accompanied by his wife and his daughter for the visit today.  He has a past history significant for bladder cancer status post transurethral bladder tumor resection in August 2018.  He subsequently had an inguinal hernia repair in November 2018.  After his surgeries he was in a good state of health.  He did not use a catheter or urine bag.  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  He is maintained a good weight.  His history is significant for diabetes type 2, coronary artery disease, hyperlipidemia, hypertension and history of acute myocardial infarction.  He is followed by cardiology and endocrinology.  He is a patient of Dr. Noah Delaine at [facility] urology and was a previous patient for primary care needs at the [facility] clinic in [place].  He presents today with his family members because they are very concerned about his state of health.  He has had several doctors visits in the past week because of this concern.  He was seen by his urologist yesterday and over the past couple weeks has had to have reinsertion of his catheter due to his inability to pass urine.  He reports that approximately 3 months ago he noticed a change in his health.  His appetite decreased and he began not feeling well.  Over the past 3 months he has lost approximately 35 pounds and his condition has declined substantially.  Over the past 2 weeks he has had increased pain in his lower abdomen and distention.  He has  been incontinent of stool over the past 2 weeks and has had decreased sensation and numbness/tingling from his umbilicus to his groin.  He reports that several days ago the numbness increased down to his feet.  He has had weakness in his legs causing him difficulty with walking and completing his daily activities.  Due to the weakness and changes in sensation he has had 3 falls over the past couple weeks.  His wife went 4 days ago and purchased a walker because he was unable to ambulate without assistance.  His family notes that he is very pale and does not seem like his normal self.  He has been eating and drinking very little because he has no appetite.  He reports that he does not feel depressed he just feels very tired and weak.  He finally told his wife this past week but he had to go and see someone to get this sorted out because he knew something was wrong.  He denies any fevers, chills, nausea, vomiting, or diarrhea.  He reports he has seen some blood in his urine.  He was seen by his previous primary care physician this week and was told to follow-up with orthopedics due to his pain.  He has had no laboratory investigation or imaging studies completed.    Past Medical History:  Diagnosis  Date  . Anemia   . Arthritis    Hands and left knee  . Coronary artery disease   . Diabetes mellitus without complication ([place])   . High cholesterol   . Hypertension   . MI (myocardial infarction) ([place]) 2009    Past Surgical History:  Procedure Laterality Date  . APPENDECTOMY    . CARDIAC CATHETERIZATION    . COLONOSCOPY    . COLONOSCOPY N/A 04/01/2017   Procedure: COLONOSCOPY;  Surgeon: Daneil Dolin, MD;  Location: [facility] END[facility] SUITE;  Service: Endoscopy;  Laterality: N/A;  9:45 AM  . CORONARY STENT PLACEMENT  2009   x3  . CYSTOSCOPY N/A 05/16/2017   Procedure: CYSTOSCOPY;  Surgeon: Cleon Gustin, MD;  Location: [facility] ORS;  Service: Urology;  Laterality: N/A;  . CYSTOSCOPY W/ URETERAL STENT  PLACEMENT Bilateral 04/11/2017   Procedure: CYSTOSCOPY WITH RETROGRADE PYELOGRAM;  Surgeon: Cleon Gustin, MD;  Location: [facility] ORS;  Service: Urology;  Laterality: Bilateral;  . FRACTURE SURGERY Right    Steel Rod in femur  . INGUINAL HERNIA REPAIR Left 08/15/2017   Procedure: HERNIA REPAIR INGUINAL ADULT WITH MESH;  Surgeon: Aviva Signs, MD;  Location: [facility] ORS;  Service: General;  Laterality: Left;  . KNEE ARTHROSCOPY Left   . TONSILLECTOMY    . TRANSURETHRAL RESECTION OF BLADDER TUMOR N/A 04/11/2017   Procedure: TRANSURETHRAL RESECTION OF BLADDER TUMOR (TURBT);  Surgeon: Cleon Gustin, MD;  Location: [facility] OR[facility];  Service: Urology;  Laterality: N/A;  1 [id number]  . TRANSURETHRAL RESECTION OF BLADDER TUMOR N/A 05/16/2017   Procedure: TRANSURETHRAL RESECTION OF BLADDER TUMOR (TURBT);  Surgeon: Cleon Gustin, MD;  Location: [facility] OR[facility];  Service: Urology;  Laterality: N/A;    Family History  Problem Relation Age of Onset  . Diabetes Mother   . Heart attack Mother   . Cancer Father        lung  . Stroke Paternal Grandfather   . Colon cancer Paternal Uncle      Social History   Socioeconomic History  . Marital status: Married    Spouse name: None  . Number of children: None  . Years of education: None  . Highest education level: None  Social Needs  . Financial resource strain: None  . Food insecurity - worry: None  . Food insecurity - inability: None  . Transportation needs - medical: None  . Transportation needs - non-medical: None  Occupational History  . None  Tobacco Use  . Smoking status: Never Smoker  . Smokeless tobacco: Never Used  Substance and Sexual Activity  . Alcohol use: No  . Drug use: No  . Sexual activity: Yes    Birth control/protection: None  Other Topics Concern  . None  Social History Narrative   Grew up in [place], [place]. Married, has 3 children.   Disability, 2009.    Worked in [employer] information [employer]  prior.     Review of Systems   Objective:   BP 120/70 (BP Location: Left Arm, Patient Position: Sitting, Cuff Size: Normal)   Pulse 88   Temp 97.8 F (36.6 C) (Temporal)   Resp 16   Ht 6\' 1"  (1.854 m)   Wt 182 lb (82.6 kg)   SpO2 95%   BMI 24.01 kg/m   Physical Exam  Constitutional: He is oriented to person, place, and time.  Cachectic appearing pale man, appearance older than stated age, sitting in chair in exam room.  Accompanied by wife and daughter.  Blunted  affect.  Appears winded with talking.  HENT:  Head: Normocephalic and atraumatic.  No lesions in oral cavity.  Dry mucous membranes.  Eyes: EOM are normal. Pupils are equal, round, and reactive to light. No scleral icterus.  Neck: Normal range of motion. Neck supple. No tracheal deviation present. No thyromegaly present.  Cardiovascular: Normal rate and regular rhythm.  Pulmonary/Chest: Effort normal and breath sounds normal. No stridor. No respiratory distress.  Abdominal: Soft. Bowel sounds are normal.  Abdomen appears distended with questionable fluid wave present.  Bowel sounds present.  Tender in suprapubic region.  Questionable hepatic enlargement.  Neurological: He is alert and oriented to person, place, and time. A sensory deficit is present. GCS eye subscore is 4. GCS verbal subscore is 5. GCS motor subscore is 6.  Decreased strength in the right lower extremities, or out of 5.  Sensory deficit present.   Skin: Skin is warm and dry.  Psychiatric: His affect is blunt. His speech is delayed. He is slowed.     Assessment and Plan  1. Weight loss, unintentional 2. Incontinence of feces, unspecified fecal incontinence type 3. Weakness 4. Paresthesias 5. Pallor 6. History of bladder cancer 7. Pain of upper abdomen  62 year old man with prior history of diabetes, cardiovascular disease, and bladder cancer status post tumor resection in August 2018.  Presenting with 66-month history of weight loss and decline in  functional status.  Acute worsening of symptoms over the past 2 weeks, now presenting with stool incontinence, lower extremity weakness and paresthesias, inability to void now with insertion of urethral catheter.  Long discussion in office today with patient and his family.  I discussed with him that I was concerned that patient was very ill and needed emergent evaluation and admission to the hospital for workup.  I did tell them that I was concerned about possible tumor recurrence pressing on his spinal cord which could be causing his weakness, numbness, and incontinence.  In addition, patient appears dehydrated and pale.  His mental status although he is alert and oriented, his affect and mood are not within normal limits.  He is at high risk for dehydration and metabolic derangements.  Discussed with his family that he needs lab work done with results back today and therefore he was not a candidate to have this done as an outpatient.  In addition, patient is very unsteady and has had multiple falls.  He is hardly able to stand without support of his family even with use of his walker.  Due to his symptoms and quick decline and the severity of his symptoms, I do believe he needs to be transferred by EMS to the emergency department for further evaluation.  Family was in agreement with this plan.  I called the inpatient team and spoke with Dr. Carles Collet.  Unfortunately, [facility] is full at this time and there are no available beds so we are unable to direct admit him at this time.  Also called and spoke with the emergency department regarding patient and possible admission.  It is felt that the best plan at this time would be to send patient to the emergency department for initial labs, evaluation and imaging studies and then admit him once a bed is available.  Therefore, EMS was called.  Family was in agreement and patient was in agreement with this plan.  EMS was briefed on patient status.  He was  transferred to [facility] emergency department for further evaluation and workup. Office  visit was greater than 60 minutes.  Greater than 50% of visit was spent counseling and coordinating care.  No Follow-up on file. Caren Macadam, MD 10/31/2017

## 2017-11-10 NOTE — ED Provider Notes (Signed)
[facility] EMERGENCY DEPARTMENT Provider Note   CSN: [id number] Arrival date & time: 11/24/2017  1041     History   Chief Complaint Chief Complaint  Patient presents with  . Tailbone Pain    HPI Raymond [name] is a 62 y.o. male.  Level 5 caveat for urgency of condition.  Complex patient presents with lower abdominal pain for unknown length of time.  Status post transurethral resection of a bladder tumor on 05/16/17 by Dr. Noah Delaine, local urologist.  He is also status post left hernia repair with mesh on 08/11/17 by Dr. Aviva Signs.  He has been eating and drinking poorly.  Family reports that he has been falling a lot lately.  He is also had a 30 pound weight loss over the past several months.  He currently has a Foley catheter in place.      Past Medical History:  Diagnosis Date  . Anemia   . Arthritis    Hands and left knee  . Coronary artery disease   . Diabetes mellitus without complication ([place])   . High cholesterol   . Hypertension   . MI (myocardial infarction) [facility]) 2009    Patient Active Problem List   Diagnosis Date Noted  . Left inguinal hernia     Past Surgical History:  Procedure Laterality Date  . APPENDECTOMY    . CARDIAC CATHETERIZATION    . COLONOSCOPY    . COLONOSCOPY N/A 04/01/2017   Procedure: COLONOSCOPY;  Surgeon: Daneil Dolin, MD;  Location: [facility] ENDO SUITE;  Service: Endoscopy;  Laterality: N/A;  9:45 AM  . CORONARY STENT PLACEMENT  2009   x3  . CYSTOSCOPY N/A 05/16/2017   Procedure: CYSTOSCOPY;  Surgeon: Cleon Gustin, MD;  Location: [facility] ORS;  Service: Urology;  Laterality: N/A;  . CYSTOSCOPY W/ URETERAL STENT PLACEMENT Bilateral 04/11/2017   Procedure: CYSTOSCOPY WITH RETROGRADE PYELOGRAM;  Surgeon: Cleon Gustin, MD;  Location: [facility] ORS;  Service: Urology;  Laterality: Bilateral;  . FRACTURE SURGERY Right    Steel Rod in femur  . INGUINAL HERNIA REPAIR Left 08/15/2017   Procedure: HERNIA REPAIR INGUINAL ADULT WITH MESH;   Surgeon: Aviva Signs, MD;  Location: [facility] ORS;  Service: General;  Laterality: Left;  . KNEE ARTHROSCOPY Left   . TONSILLECTOMY    . TRANSURETHRAL RESECTION OF BLADDER TUMOR N/A 04/11/2017   Procedure: TRANSURETHRAL RESECTION OF BLADDER TUMOR (TURBT);  Surgeon: Cleon Gustin, MD;  Location: [facility] ORS;  Service: Urology;  Laterality: N/A;  1 [id number] [id number]  . TRANSURETHRAL RESECTION OF BLADDER TUMOR N/A 05/16/2017   Procedure: TRANSURETHRAL RESECTION OF BLADDER TUMOR (TURBT);  Surgeon: Cleon Gustin, MD;  Location: [facility] ORS;  Service: Urology;  Laterality: N/A;       Home Medications    Prior to Admission medications   Medication Sig Start Date End Date Taking? Authorizing Provider  aspirin 325 MG tablet Take 325 mg by mouth at bedtime.    Yes [provider]  celecoxib (CELEBREX) 200 MG capsule Take 200 mg by mouth daily as needed for mild pain.    Yes [provider]  Choline Fenofibrate (FENOFIBRIC ACID) 135 MG CPDR Take 135 mg by mouth at bedtime.    Yes [provider]  CINNAMON PO Take 1,000 mg by mouth at bedtime.    Yes [provider]  clopidogrel (PLAVIX) 75 MG tablet TAKE 1 TABLET DAILY Patient taking differently: TAKE 1 TABLET AT BEDTIME 02/23/13  Yes Lorretta Harp, MD  Coenzyme Q10 (COQ10) 100 MG CAPS Take 100 mg by mouth at bedtime.   Yes [provider]  ezetimibe (ZETIA) 10 MG tablet Take 10 mg by mouth at bedtime.    Yes [provider]  ibuprofen (ADVIL,MOTRIN) 200 MG tablet Take 400 mg every 8 (eight) hours as needed by mouth for mild pain.    Yes [provider]  insulin lispro protamine-lispro (HUMALOG 75/25 MIX) (75-25) 100 UNIT/ML SUSP injection Inject 30-35 Units into the skin 2 (two) times daily with a meal. Patient takes 35 units in the morning and 30 units at night   Yes [provider]  metFORMIN (GLUCOPHAGE) 500 MG tablet Take 1,000 mg by mouth 2 (two)  times daily with a meal. 03/09/17  Yes [provider]  metoprolol succinate (TOPROL-XL) 25 MG 24 hr tablet Take 25 mg by mouth at bedtime. 02/08/17  Yes [provider]  Multiple Vitamin (MULTIVITAMIN) tablet Take 1 tablet by mouth daily.   Yes [provider]  naproxen sodium (ANAPROX) 220 MG tablet Take 440 mg by mouth daily as needed (pain).   Yes [provider]  nitroGLYCERIN (NITROSTAT) 0.4 MG SL tablet Place 0.4 mg every 5 (five) minutes as needed under the tongue for chest pain.   Yes [provider]  Omega-3 Fatty Acids (FISH OIL) 1200 MG CAPS Take 1,200 mg by mouth at bedtime.    Yes [provider]  rosuvastatin (CRESTOR) 40 MG tablet Take 40 mg by mouth at bedtime.    Yes [provider]  sulfamethoxazole-trimethoprim (BACTRIM DS,SEPTRA DS) 800-160 MG tablet Take 1 tablet by mouth 2 (two) times daily.  11/02/17  Yes [provider]  tamsulosin (FLOMAX) 0.4 MG CAPS capsule Take 0.4 mg by mouth at bedtime.   Yes [provider]  telmisartan (MICARDIS) 80 MG tablet Take 80 mg by mouth at bedtime.    Yes [provider]  traMADol (ULTRAM) 50 MG tablet Take 50 mg by mouth every 8 (eight) hours as needed for moderate pain.  11/07/17  Yes [provider]  traZODone (DESYREL) 100 MG tablet Take 50-100 mg by mouth at bedtime as needed.  11/07/17  Yes [provider]  VICTOZA 18 MG/3ML SOPN Inject 1.8 mg into the skin daily. 03/03/17  Yes [provider]    Family History Family History  Problem Relation Age of Onset  . Diabetes Mother   . Heart attack Mother   . Cancer Father        lung  . Stroke Paternal Grandfather   . Colon cancer Paternal Uncle     Social History Social History   Tobacco Use  . Smoking status: Never Smoker  . Smokeless tobacco: Never Used  Substance Use Topics  . Alcohol use: No  . Drug use: No     Allergies   Patient has no known  allergies.   Review of Systems Review of Systems  Unable to perform ROS: Acuity of condition     Physical Exam Updated Vital Signs BP (!) 162/74   Pulse 85   Temp 97.9 F (36.6 C) (Oral)   Resp 16   SpO2 100%   Physical Exam  Constitutional: He is oriented to person, place, and time.  Patient appears in no acute distress.  HENT:  Head: Normocephalic and atraumatic.  Eyes: Conjunctivae are normal.  Neck: Neck supple.  Cardiovascular: Normal rate and regular rhythm.  Pulmonary/Chest: Effort normal and breath sounds normal.  Abdominal:  Minimal  generalized tenderness in the lower abdomen  Genitourinary:  Genitourinary Comments: Foley catheter in place.  Musculoskeletal: Normal range of motion.  Neurological: He is alert and oriented to person, place, and time.  Skin: Skin is warm and dry.  Psychiatric:  Flat affect.  Nursing note and vitals reviewed.    ED Treatments / Results  Labs (all labs ordered are listed, but only abnormal results are displayed) Labs Reviewed  CBC WITH DIFFERENTIAL/PLATELET - Abnormal; Notable for the following components:      Result Value   WBC 12.9 (*)    Neutro Abs 11.2 (*)    All other components within normal limits  COMPREHENSIVE METABOLIC PANEL - Abnormal; Notable for the following components:   Sodium 122 (*)    Chloride 85 (*)    Glucose, Bld 305 (*)    BUN 24 (*)    All other components within normal limits  URINALYSIS, ROUTINE W REFLEX MICROSCOPIC - Abnormal; Notable for the following components:   Color, Urine STRAW (*)    Glucose, UA >=500 (*)    Ketones, ur 20 (*)    All other components within normal limits  LIPASE, BLOOD    EKG  EKG Interpretation None       Radiology Ct Abdomen Pelvis W Contrast  Result Date: 11/12/2017 CLINICAL DATA:  Lower pelvic pain, diagnosed with bladder cancer 2 months ago, undergoing intravesicular treatments, history coronary artery disease post MI, diabetes mellitus, hypertension  EXAM: CT ABDOMEN AND PELVIS WITH CONTRAST TECHNIQUE: Multidetector CT imaging of the abdomen and pelvis was performed using the standard protocol following bolus administration of intravenous contrast. Sagittal and coronal MPR images reconstructed from axial data set. CONTRAST:  161mL ISOVUE-300 IOPAMIDOL (ISOVUE-300) INJECTION 61% IV. No oral contrast. COMPARISON:  01/05/2017 FINDINGS: Lower chest: Lung bases clear Hepatobiliary: Gallbladder and liver normal appearance Pancreas: Normal appearance Spleen: Normal appearance Adrenals/Urinary Tract: Minimal chronic thickening of LEFT adrenal gland without discrete mass. Kidneys and ureters normal appearance. Foley catheter decompresses urinary bladder. Bladder mass identified on the previous exam not visualized. Stomach/Bowel: Appendix surgically absent by history. Stomach and bowel loops normal appearance Vascular/Lymphatic: Atherosclerotic calcifications aorta and coronary arteries without aortic aneurysm. Few normal sized to mildly enlarged lymph nodes at the LEFT inguinal region. Reproductive: Mild prostatic enlargement gland measuring 5.9 x 4.1 x 3.2 cm. Seminal vesicles unremarkable. Other: Infiltrative changes at LEFT inguinal region, by history inguinal herniorrhaphy 08/15/2017. Small focal fluid collection at LEFT inguinal hernia repair 21 x 9 mm image 83 question seroma or lymphocele, infection considered less likely this far removed from surgery though not completely excluded. No free intraperitoneal air or fluid. Musculoskeletal: Unremarkable IMPRESSION: Prostatic enlargement. No acute intra-abdominal or intrapelvic abnormalities. Postoperative changes of LEFT inguinal herniorrhaphy with reactive lymph nodes and a 21 x 9 mm fluid collection at the surgical bed favor seroma versus lymphocele as discussed above. Aortic Atherosclerosis (ICD10-I70.0). Electronically Signed   By: Lavonia Dana M.D.   On: 10/28/2017 16:55    Procedures Procedures (including  critical care time)  Medications Ordered in ED Medications  sodium chloride 0.9 % bolus 500 mL (0 mLs Intravenous Stopped 11/06/2017 1347)  iopamidol (ISOVUE-300) 61 % injection 100 mL (100 mLs Intravenous Contrast Given 11/07/2017 1619)     Initial Impression / Assessment and Plan / ED Course  I have reviewed the triage vital signs and the nursing notes.  Pertinent labs & imaging results that were available during my care of the patient were reviewed by me and  considered in my medical decision making (see chart for details).     Patient presents with lower abdominal pain and failure to thrive.  White count is minimally elevated.  CT of abdomen/pelvis reveals prosthetic enlargement, but no acute intra-abdominal or intrapelvic anomalies.  There are postoperative changes in the left inguinal area with reactive lymph nodes and a 21 x 9 mm fluid collection at the surgical site.  Sodium is 122.  Will admit for hyponatremia and failure to thrive.  Final Clinical Impressions(s) / ED Diagnoses   Final diagnoses:  Hyponatremia  Lower abdominal pain    ED Discharge Orders    None       Nat Christen, MD 11/13/2017 1735

## 2017-11-10 NOTE — ED Notes (Signed)
Blood work taken down to lab

## 2017-11-10 NOTE — ED Notes (Addendum)
Have given pt a meal tray per doctor's orders  

## 2017-11-10 NOTE — H&P (Addendum)
[facility] H&P    Patient Demographics:    Raymond [name], is a 62 y.o. male  MRN: [id number]  DOB - [birth year]  Admit Date - 11/20/2017  Referring MD/NP/PA: Dr. Lacinda Axon  Outpatient Primary MD for the patient is Caren Macadam, MD  Patient coming from: home  Chief complaint- generalized weakness   HPI:    Raymond [name]  is a 62 y.o. male, with history of diabetes mellitus, CAD, status post transurethral resection of bladder tumor came to hospital with complaints of generalized weakness, pelvic pain and numbness for past three weeks. As per patient, he was seen by urologist yesterday and Foley catheter was inserted for urinary retention. Status post left hernia repair with mesh on 08/11/17 by Dr. Aviva Signs. He denies nausea vomiting or diarrhea. Denies abdominal pain. No chest pain or shortness of breath. Also complains of insomnia for past 3 to 4 weeks due to pain. Was prescribed trazodone with only minimal relief.  In the ED CT scan of the abdomen was done which showed only prostate enlargement lab work showed sodium 122.     Review of systems:      All other systems reviewed and are negative.   With Past History of the following :    Past Medical History:  Diagnosis Date  . Anemia   . Arthritis    Hands and left knee  . Coronary artery disease   . Diabetes mellitus without complication ([place])   . High cholesterol   . Hypertension   . MI (myocardial infarction) ([place]) 2009      Past Surgical History:  Procedure Laterality Date  . APPENDECTOMY    . CARDIAC CATHETERIZATION    . COLONOSCOPY    . COLONOSCOPY N/A 04/01/2017   Procedure: COLONOSCOPY;  Surgeon: Daneil Dolin, MD;  Location: [facility] ENDO SUITE;  Service: Endoscopy;  Laterality: N/A;  9:45 AM  . CORONARY STENT PLACEMENT  2009   x3  . CYSTOSCOPY N/A 05/16/2017   Procedure: CYSTOSCOPY;  Surgeon: Cleon Gustin, MD;  Location: [facility] ORS;   Service: Urology;  Laterality: N/A;  . CYSTOSCOPY W/ URETERAL STENT PLACEMENT Bilateral 04/11/2017   Procedure: CYSTOSCOPY WITH RETROGRADE PYELOGRAM;  Surgeon: Cleon Gustin, MD;  Location: [facility] ORS;  Service: Urology;  Laterality: Bilateral;  . FRACTURE SURGERY Right    Steel Rod in femur  . INGUINAL HERNIA REPAIR Left 08/15/2017   Procedure: HERNIA REPAIR INGUINAL ADULT WITH MESH;  Surgeon: Aviva Signs, MD;  Location: [facility] ORS;  Service: General;  Laterality: Left;  . KNEE ARTHROSCOPY Left   . TONSILLECTOMY    . TRANSURETHRAL RESECTION OF BLADDER TUMOR N/A 04/11/2017   Procedure: TRANSURETHRAL RESECTION OF BLADDER TUMOR (TURBT);  Surgeon: Cleon Gustin, MD;  Location: [facility] ORS;  Service: Urology;  Laterality: N/A;  1 [id number] [id number]  . TRANSURETHRAL RESECTION OF BLADDER TUMOR N/A 05/16/2017   Procedure: TRANSURETHRAL RESECTION OF BLADDER TUMOR (TURBT);  Surgeon: Cleon Gustin, MD;  Location: [facility] ORS;  Service: Urology;  Laterality: N/A;      Social History:  Social History   Tobacco Use  . Smoking status: Never Smoker  . Smokeless tobacco: Never Used  Substance Use Topics  . Alcohol use: No       Family History :     Family History  Problem Relation Age of Onset  . Diabetes Mother   . Heart attack Mother   . Cancer Father        lung  . Stroke Paternal Grandfather   . Colon cancer Paternal Uncle       Home Medications:   Prior to Admission medications   Medication Sig Start Date End Date Taking? Authorizing Provider  aspirin 325 MG tablet Take 325 mg by mouth at bedtime.    Yes [provider]  celecoxib (CELEBREX) 200 MG capsule Take 200 mg by mouth daily as needed for mild pain.    Yes [provider]  Choline Fenofibrate (FENOFIBRIC ACID) 135 MG CPDR Take 135 mg by mouth at bedtime.    Yes [provider]  CINNAMON PO Take 1,000 mg by mouth at bedtime.    Yes [provider]    clopidogrel (PLAVIX) 75 MG tablet TAKE 1 TABLET DAILY Patient taking differently: TAKE 1 TABLET AT BEDTIME 02/23/13  Yes Lorretta Harp, MD  Coenzyme Q10 (COQ10) 100 MG CAPS Take 100 mg by mouth at bedtime.   Yes [provider]  ezetimibe (ZETIA) 10 MG tablet Take 10 mg by mouth at bedtime.    Yes [provider]  ibuprofen (ADVIL,MOTRIN) 200 MG tablet Take 400 mg every 8 (eight) hours as needed by mouth for mild pain.    Yes [provider]  insulin lispro protamine-lispro (HUMALOG 75/25 MIX) (75-25) 100 UNIT/ML SUSP injection Inject 30-35 Units into the skin 2 (two) times daily with a meal. Patient takes 35 units in the morning and 30 units at night   Yes [provider]  metFORMIN (GLUCOPHAGE) 500 MG tablet Take 1,000 mg by mouth 2 (two) times daily with a meal. 03/09/17  Yes [provider]  metoprolol succinate (TOPROL-XL) 25 MG 24 hr tablet Take 25 mg by mouth at bedtime. 02/08/17  Yes [provider]  Multiple Vitamin (MULTIVITAMIN) tablet Take 1 tablet by mouth daily.   Yes [provider]  naproxen sodium (ANAPROX) 220 MG tablet Take 440 mg by mouth daily as needed (pain).   Yes [provider]  nitroGLYCERIN (NITROSTAT) 0.4 MG SL tablet Place 0.4 mg every 5 (five) minutes as needed under the tongue for chest pain.   Yes [provider]  Omega-3 Fatty Acids (FISH OIL) 1200 MG CAPS Take 1,200 mg by mouth at bedtime.    Yes [provider]  rosuvastatin (CRESTOR) 40 MG tablet Take 40 mg by mouth at bedtime.    Yes [provider]  sulfamethoxazole-trimethoprim (BACTRIM DS,SEPTRA DS) 800-160 MG tablet Take 1 tablet by mouth 2 (two) times daily.  11/02/17  Yes [provider]  tamsulosin (FLOMAX) 0.4 MG CAPS capsule Take 0.4 mg by mouth at bedtime.   Yes [provider]  telmisartan (MICARDIS) 80 MG tablet Take 80 mg by mouth at bedtime.    Yes [provider]  traMADol  (ULTRAM) 50 MG tablet Take 50 mg by mouth every 8 (eight) hours as needed for moderate pain.  11/07/17  Yes [provider]  traZODone (DESYREL) 100 MG tablet Take 50-100 mg by mouth at bedtime as needed.  11/07/17  Yes [provider]  VICTOZA 18  MG/3ML SOPN Inject 1.8 mg into the skin daily. 03/03/17  Yes [provider]     Allergies:    No Known Allergies   Physical Exam:   Vitals  Blood pressure (!) 155/72, pulse 82, temperature 97.9 F (36.6 C), temperature source Oral, resp. rate 15, SpO2 98 %.  1.  General: appears in no acute distress  2. Psychiatric:  Intact judgement and  insight, awake alert, oriented x 3.  3. Neurologic: No focal neurological deficits, all cranial nerves intact.Strength 5/5 all 4 extremities, sensation intact all 4 extremities, plantars down going.  4. Eyes :  anicteric sclerae, moist conjunctivae with no lid lag. PERRLA.  5. ENMT:  Oropharynx clear with moist mucous membranes and good dentition  6. Neck:  supple, no cervical lymphadenopathy appriciated, No thyromegaly  7. Respiratory : Normal respiratory effort, good air movement bilaterally,clear to  auscultation bilaterally  8. Cardiovascular : RRR, no gallops, rubs or murmurs, no leg edema  9. Gastrointestinal:  Positive bowel sounds, abdomen soft, non-tender to palpation,no hepatosplenomegaly, no rigidity or guarding       10. Skin:  No cyanosis, normal texture and turgor, no rash, lesions or ulcers  11.Musculoskeletal:  Good muscle tone,  joints appear normal , no effusions,  normal range of motion    Data Review:    CBC Recent Labs  Lab 11/16/2017 1112  WBC 12.9*  HGB 13.7  HCT 39.9  PLT 326  MCV 83.6  MCH 28.7  MCHC 34.3  RDW 13.3  LYMPHSABS 0.7  MONOABS 1.0  EOSABS 0.0  BASOSABS 0.0   ------------------------------------------------------------------------------------------------------------------  Chemistries  Recent Labs  Lab  11/09/2017 1112  NA 122*  K 4.7  CL 85*  CO2 24  GLUCOSE 305*  BUN 24*  CREATININE 0.91  CALCIUM 9.3  AST 28  ALT 32  ALKPHOS 56  BILITOT 0.8   ------------------------------------------------------------------------------------------------------------------  ------------------------------------------------------------------------------------------------------------------ GFR: Estimated Creatinine Clearance: 93.9 mL/min (by C-G formula based on SCr of 0.91 mg/dL). Liver Function Tests: Recent Labs  Lab 11/16/2017 1112  AST 28  ALT 32  ALKPHOS 56  BILITOT 0.8  PROT 7.4  ALBUMIN 3.9   Recent Labs  Lab 11/20/2017 1112  LIPASE 44    --------------------------------------------------------------------------------------------------------------- Urine analysis:    Component Value Date/Time   COLORURINE STRAW (A) 11/23/2017 1240   APPEARANCEUR CLEAR 11/15/2017 1240   LABSPEC 1.021 11/18/2017 1240   PHURINE 5.0 11/11/2017 1240   GLUCOSEU >=500 (A) 11/11/2017 1240   HGBUR NEGATIVE 11/07/2017 [place] 11/20/2017 1240   KETONESUR 20 (A) 10/30/2017 1240   PROTEINUR NEGATIVE 11/08/2017 1240   NITRITE NEGATIVE 11/06/2017 1240   LEUKOCYTESUR NEGATIVE 11/06/2017 1240      Imaging Results:    Ct Abdomen Pelvis W Contrast  Result Date: 11/24/2017 CLINICAL DATA:  Lower pelvic pain, diagnosed with bladder cancer 2 months ago, undergoing intravesicular treatments, history coronary artery disease post MI, diabetes mellitus, hypertension EXAM: CT ABDOMEN AND PELVIS WITH CONTRAST TECHNIQUE: Multidetector CT imaging of the abdomen and pelvis was performed using the standard protocol following bolus administration of intravenous contrast. Sagittal and coronal MPR images reconstructed from axial data set. CONTRAST:  190mL ISOVUE-300 IOPAMIDOL (ISOVUE-300) INJECTION 61% IV. No oral contrast. COMPARISON:  01/05/2017 FINDINGS: Lower chest: Lung bases clear Hepatobiliary:  Gallbladder and liver normal appearance Pancreas: Normal appearance Spleen: Normal appearance Adrenals/Urinary Tract: Minimal chronic thickening of LEFT adrenal gland without discrete mass. Kidneys and ureters normal appearance. Foley catheter decompresses urinary bladder. Bladder mass  identified on the previous exam not visualized. Stomach/Bowel: Appendix surgically absent by history. Stomach and bowel loops normal appearance Vascular/Lymphatic: Atherosclerotic calcifications aorta and coronary arteries without aortic aneurysm. Few normal sized to mildly enlarged lymph nodes at the LEFT inguinal region. Reproductive: Mild prostatic enlargement gland measuring 5.9 x 4.1 x 3.2 cm. Seminal vesicles unremarkable. Other: Infiltrative changes at LEFT inguinal region, by history inguinal herniorrhaphy 08/15/2017. Small focal fluid collection at LEFT inguinal hernia repair 21 x 9 mm image 83 question seroma or lymphocele, infection considered less likely this far removed from surgery though not completely excluded. No free intraperitoneal air or fluid. Musculoskeletal: Unremarkable IMPRESSION: Prostatic enlargement. No acute intra-abdominal or intrapelvic abnormalities. Postoperative changes of LEFT inguinal herniorrhaphy with reactive lymph nodes and a 21 x 9 mm fluid collection at the surgical bed favor seroma versus lymphocele as discussed above. Aortic Atherosclerosis (ICD10-I70.0). Electronically Signed   By: Lavonia Dana M.D.   On: 11/03/2017 16:55      Assessment & Plan:    Active Problems:   Hyponatremia   1. Hyponatremia-sodium is 122, likely from poor PO intake, started on IV normal saline at 75 ML per hour. Check serum osmolality. Follow BMP in am 2. Generalized weakness-likely from above. Consider PT evaluation once patient's sodium improves. 3. Pelvic pain-patient has not been able to sleep due to pelvic pain. Start Percocet one to two tablets Q six hours PRN. 4. Urinary retention/BPH- Foley  catheter in place 5. Status post transurethral resection of bladder tumor- followed by urology as outpatient 6. Diabetes mellitus-continue insulin 70/30, 30 units BID, sliding scale insulin with NovoLog.   DVT Prophylaxis-   Lovenox   AM Labs Ordered, also please review Full Orders  Family Communication: Admission, patients condition and plan of care including tests being ordered have been discussed with the patient  who indicate understanding and agree with the plan and Code Status.  Code Status: full code  Admission status: observation  Time spent in minutes : 60 minutes   Oswald Hillock M.D on 11/11/2017 at 9:02 PM  Between 7am to 7pm - Pager - [phone]. After 7pm go to [email or web address] - password [facility]  [facility] - Office  [phone]

## 2017-11-10 NOTE — ED Triage Notes (Addendum)
Per EMS, pt had a bladder resection in July. Pt reports no urine output or BM, numbness, and frequent falls for last several weeks. Pt has had weight loss over last two months. EMS reports physicians suspect tumor on pts spine but unsure if pt or pt family know of this.

## 2017-11-10 NOTE — ED Notes (Signed)
Have notified pt that CT is back and doctor will come and review results

## 2017-11-11 ENCOUNTER — Observation Stay (HOSPITAL_COMMUNITY): Payer: Federal, State, Local not specified - PPO

## 2017-11-11 DIAGNOSIS — E871 Hypo-osmolality and hyponatremia: Secondary | ICD-10-CM | POA: Diagnosis not present

## 2017-11-11 DIAGNOSIS — M5126 Other intervertebral disc displacement, lumbar region: Secondary | ICD-10-CM | POA: Diagnosis not present

## 2017-11-11 LAB — COMPREHENSIVE METABOLIC PANEL
ALT: 27 U/L (ref 17–63)
ANION GAP: 14 (ref 5–15)
AST: 23 U/L (ref 15–41)
Albumin: 3.5 g/dL (ref 3.5–5.0)
Alkaline Phosphatase: 57 U/L (ref 38–126)
BUN: 18 mg/dL (ref 6–20)
CALCIUM: 8.8 mg/dL — AB (ref 8.9–10.3)
CHLORIDE: 90 mmol/L — AB (ref 101–111)
CO2: 23 mmol/L (ref 22–32)
Creatinine, Ser: 0.89 mg/dL (ref 0.61–1.24)
GFR calc non Af Amer: >60 mL/min (ref >60)
Glucose, Bld: 252 mg/dL — ABNORMAL HIGH (ref 65–99)
Potassium: 4.2 mmol/L (ref 3.5–5.1)
SODIUM: 127 mmol/L — AB (ref 135–145)
Total Bilirubin: 1 mg/dL (ref 0.3–1.2)
Total Protein: 6.6 g/dL (ref 6.5–8.1)

## 2017-11-11 LAB — BASIC METABOLIC PANEL
ANION GAP: 11 (ref 5–15)
BUN: 22 mg/dL — ABNORMAL HIGH (ref 6–20)
CO2: 26 mmol/L (ref 22–32)
Calcium: 9.3 mg/dL (ref 8.9–10.3)
Chloride: 92 mmol/L — ABNORMAL LOW (ref 101–111)
Creatinine, Ser: 0.91 mg/dL (ref 0.61–1.24)
GFR calc non Af Amer: >60 mL/min (ref >60)
Glucose, Bld: 299 mg/dL — ABNORMAL HIGH (ref 65–99)
POTASSIUM: 4.2 mmol/L (ref 3.5–5.1)
SODIUM: 129 mmol/L — AB (ref 135–145)

## 2017-11-11 LAB — GLUCOSE, CAPILLARY
GLUCOSE-CAPILLARY: 326 mg/dL — AB (ref 65–99)
GLUCOSE-CAPILLARY: 157 mg/dL — AB (ref 65–99)
GLUCOSE-CAPILLARY: 110 mg/dL — AB (ref 65–99)
Glucose-Capillary: 338 mg/dL — ABNORMAL HIGH (ref 65–99)

## 2017-11-11 LAB — CBC
HCT: 38.7 % — ABNORMAL LOW (ref 39.0–52.0)
Hemoglobin: 13.3 g/dL (ref 13.0–17.0)
MCH: 28.7 pg (ref 26.0–34.0)
MCHC: 34.4 g/dL (ref 30.0–36.0)
MCV: 83.6 fL (ref 78.0–100.0)
PLATELETS: 311 10*3/uL (ref 150–400)
RBC: 4.63 MIL/uL (ref 4.22–5.81)
RDW: 13.2 % (ref 11.5–15.5)
WBC: 10 10*3/uL (ref 4.0–10.5)

## 2017-11-11 LAB — HEMOGLOBIN A1C
HEMOGLOBIN A1C: 9.2 % — AB (ref 4.8–5.6)
MEAN PLASMA GLUCOSE: 217.34 mg/dL

## 2017-11-11 LAB — OSMOLALITY: Osmolality: 287 mOsm/kg (ref 275–295)

## 2017-11-11 MED ORDER — GLUCERNA SHAKE PO LIQD
237.0000 mL | Freq: Three times a day (TID) | ORAL | Status: DC
  Administered 2017-11-11 – 2017-11-14 (×8): 237 mL via ORAL

## 2017-11-11 MED ORDER — GADOBENATE DIMEGLUMINE 529 MG/ML IV SOLN
16.0000 mL | Freq: Once | INTRAVENOUS | Status: AC | PRN
  Administered 2017-11-11: 16 mL via INTRAVENOUS

## 2017-11-11 NOTE — Progress Notes (Signed)
Initial Nutrition Assessment  DOCUMENTATION CODES:  Severe malnutrition in context of chronic illness  INTERVENTION:  Due to hyperglycemia, D/C ensure and begin Glucerna Shake po TID, each supplement provides 220 kcal and 10 grams of protein  Took food requests/prefs.   NUTRITION DIAGNOSIS:  Severe Malnutrition(Chronic context) related to Abdominal pain (worse w/ intake), weakness, lack of any appetite, as evidenced by an intake that has met </= 75% of needs for >/= 1 month and a loss of >14% bw in <3 months  GOAL:  Patient will meet greater than or equal to 90% of their needs  MONITOR:  PO intake, Supplement acceptance, Diet advancement, Labs, Weight trends  REASON FOR ASSESSMENT:  Malnutrition Screening Tool    ASSESSMENT:  62 y/o male PMHx Anemia, CAD, MI, DM2, HTN/HLD w/ recent TURP last July and August for bladder tumor. Presents with generalized pelvic pain & numbness/weakness of BLE x3 weeks. Reports loss of 35 lbs x3 months due to decreased appetite and abd pain/distention. Symptoms concerning for tumor recurrence pressing on spinal cord. In ED worked up for hyponatremia (122) and admitted for management.   Patient seen in bed with eyes close, ill appearing/weak. Patient speaks/replys very little to questions. Most history obtained from son at bedside.   Per their report, pt's decline has been more acute than over the past 3 months, more like 1.5 months. Patient suffered from a complete loss of appetite as well as constant abdominal pain the worsened with PO intake.  The patient would attempt to eat his normal number of meals, but would substantially less than usual. Some examples of items he would eat were eggs, raisins, nuts. At baseline, he was cognizant of how many carbs he ate, though he didn't follow a true DM diet. He took a MVI.   They report patient has lost 30-35 lbs in the last 1.5 months. Per chart hx, the patient was 209-211 in November. 3 months later, he is 179  lbs. There is no wt hx from that interval to confirm a more acute loss. This is a extreme, clinically significant wt loss of 29 lbs (14% bw)  in <3 months. His weight had been stable 200-210 since July.   At this time, patient says he was able to tolerate a small amount of his lunch. He says his pain was not as bad as usual, however, He had also received pain medication. His blood sugars have been quite high. Will change Ensure to Glucerna. Patient didn't check his BG at home and doesn't know what his normal range is. RD attempted to take food requests to promote intake, but pt largely disinterested.  Per son, pt did poorly with PT and was unable to get up. RD concerned about how he will be able to maintain nutritional status at home.   Physical Exam: Mild muscle wasting of gastrocnemius, interosseous, deltoids and temporalis. Moderate muscle wasting of quadriceps, clavicular musculature. Mild orbital/underarm fat wasting. Moderate thoracic fat wasting.   Patient certainly meets severe malnutrition criteria based on level of intake and weight loss  Labs: Na improved to 129, A1C 9.2=~217 avg. Current BGs: 250-350 Meds: Ensure ENlive, insulin, IVF, PRN oxycodone  Recent Labs  Lab 11/08/2017 1112 11/11/17 0449 11/11/17 1332  NA 122* 127* 129*  K 4.7 4.2 4.2  CL 85* 90* 92*  CO2 _0 BUN 24* 18 22*  CREATININE 0.91 0.89 0.91  CALCIUM 9.3 8.8* 9.3  GLUCOSE 305* 252* 299*   NUTRITION - FOCUSED PHYSICAL  EXAM:   Most Recent Value  Orbital Region  Mild depletion  Upper Arm Region  Mild depletion  Thoracic and Lumbar Region  Moderate depletion  Buccal Region  No depletion  Temple Region  Mild depletion  Clavicle Bone Region  Moderate depletion  Clavicle and Acromion Bone Region  Mild depletion  Scapular Bone Region  Unable to assess  Dorsal Hand  Mild depletion  Patellar Region  No depletion  Anterior Thigh Region  Moderate depletion  Posterior Calf Region  Mild depletion  Edema (RD  Assessment)  None     Diet Order:  Diet regular Room service appropriate? Yes; Fluid consistency: Thin  EDUCATION NEEDS:  No education needs have been identified at this time  Skin:  Abrasion to R elbow  Last BM:  2/11-incontinent  Height:  Ht Readings from Last 1 Encounters:  11/06/2017 _0  (1.854 m)   Weight:  Wt Readings from Last 1 Encounters:  11/18/2017 179 lb 0.2 oz (81.2 kg)   Wt Readings from Last 10 Encounters:  11/02/2017 179 lb 0.2 oz (81.2 kg)  11/04/2017 182 lb (82.6 kg)  08/25/17 209 lb (94.8 kg)  08/12/17 210 lb (95.3 kg)  08/09/17 211 lb (95.7 kg)  05/16/17 200 lb (90.7 kg)  05/11/17 200 lb (90.7 kg)  04/15/17 206 lb (93.4 kg)  04/11/17 206 lb (93.4 kg)  04/08/17 206 lb 12.8 oz (93.8 kg)   Ideal Body Weight:  83.64 kg  BMI:  Body mass index is 23.62 kg/m.  Estimated Nutritional Needs:  Kcal:  2200-2450 kcals (27-30 kcal/kg bw) Protein:  97-114 g Pro (1.2-1.4 g/kg bw) Fluid:  >2.4 L (30 ml/kg bw)  Burtis Junes RD, LDN, CNSC Clinical Nutrition Pager: [phone] 11/11/2017 3:02 PM

## 2017-11-11 NOTE — Progress Notes (Signed)
PROGRESS NOTE    Raymond [name]  CMK:[id number] DOB: Oct 02, 1955 DOA: 11/22/2017 PCP: Caren Macadam, MD   Brief Narrative:   This is a 62 year old male with recent TURP secondary to bladder tumor who presented to the hospital with generalized weakness, pelvic pain and numbness for past 3 weeks.  He has had significant urinary retention with recent Foley catheter placement.  He was noted to have significant generalized weakness with serum sodium of 122 for which he was placed on IV normal saline with some improvement this morning.  In speaking with his primary care provider Dr. Mannie Stabile, there was concern about some possible cauda equina and MRI of the lumbar spine was performed demonstrating some lower lumbar findings, but no cauda equina at the present.  Assessment & Plan:   Active Problems:   Hyponatremia   1. Generalized weakness secondary to hyponatremia-improving.  Continue IV normal saline and follow-up serum sodium levels this afternoon as well as in the a.m.  This appears to be related to poor oral intake.  PT evaluation ordered today for further assessment.  MRI of the lumbar spine with no concerning findings at the present. 2. Pelvic floor pain.  Continue Percocet every 6 hours as needed with good relief thus far. 3. Urinary retention secondary to BPH.  Continue Foley catheter. 4. Status post transurethral resection of bladder tumor.  Followed by urology as an outpatient. 5. Diabetes.  Continue on insulin as prescribed with good control noted.   DVT prophylaxis:Lovenox Code Status: Full code Family Communication: Wife at bedside Disposition Plan: Continue IV NS for hyponatremia; PT evaluation   Consultants:   None  Procedures:   None  Antimicrobials:   None   Subjective: Patient seen and evaluated today with no new acute complaints or concerns. No acute concerns or events noted overnight.  Objective: Vitals:   11/19/2017 1955 11/08/2017 2000 11/22/2017 2300 11/11/17  0645  BP: (!) 158/69 (!) 155/72 (!) 147/68 (!) 141/68  Pulse: 84 82 88 73  Resp: 16 15 18 16   Temp:   98.6 F (37 C) 98.4 F (36.9 C)  TempSrc:   Oral   SpO2: 98% 98% 98% 100%  Weight:   81.2 kg (179 lb 0.2 oz)   Height:   6\' 1"  (1.854 m)     Intake/Output Summary (Last 24 hours) at 11/11/2017 1311 Last data filed at 11/11/2017 0650 Gross per 24 hour  Intake -  Output 2900 ml  Net -2900 ml   Filed Weights   11/19/2017 2300  Weight: 81.2 kg (179 lb 0.2 oz)    Examination:  General exam: Appears calm and comfortable  Respiratory system: Clear to auscultation. Respiratory effort normal. Cardiovascular system: S1 & S2 heard, RRR. No JVD, murmurs, rubs, gallops or clicks. No pedal edema. Gastrointestinal system: Abdomen is nondistended, soft and nontender. No organomegaly or masses felt. Normal bowel sounds heard. Central nervous system: Alert and oriented. No focal neurological deficits. Extremities: Symmetric 5 x 5 power. Skin: No rashes, lesions or ulcers; foley with clear, yellow UO noted    Data Reviewed: I have personally reviewed following labs and imaging studies  CBC: Recent Labs  Lab 11/18/2017 1112 11/11/17 0449  WBC 12.9* 10.0  NEUTROABS 11.2*  --   HGB 13.7 13.3  HCT 39.9 38.7*  MCV 83.6 83.6  PLT 326 569   Basic Metabolic Panel: Recent Labs  Lab 11/14/2017 1112 11/11/17 0449  NA 122* 127*  K 4.7 4.2  CL 85* 90*  CO2 24  23  GLUCOSE 305* 252*  BUN 24* 18  CREATININE 0.91 0.89  CALCIUM 9.3 8.8*   GFR: Estimated Creatinine Clearance: 96 mL/min (by C-G formula based on SCr of 0.89 mg/dL). Liver Function Tests: Recent Labs  Lab 11/20/2017 1112 11/11/17 0449  AST 28 23  ALT 32 27  ALKPHOS 56 57  BILITOT 0.8 1.0  PROT 7.4 6.6  ALBUMIN 3.9 3.5   Recent Labs  Lab 11/05/2017 1112  LIPASE 44   No results for input(s): AMMONIA in the last 168 hours. Coagulation Profile: No results for input(s): INR, PROTIME in the last 168 hours. Cardiac  Enzymes: No results for input(s): CKTOTAL, CKMB, CKMBINDEX, TROPONINI in the last 168 hours. BNP (last 3 results) No results for input(s): PROBNP in the last 8760 hours. HbA1C: No results for input(s): HGBA1C in the last 72 hours. CBG: Recent Labs  Lab 11/11/17 0916 11/11/17 1151  GLUCAP 338* 326*   Lipid Profile: No results for input(s): CHOL, HDL, LDLCALC, TRIG, CHOLHDL, LDLDIRECT in the last 72 hours. Thyroid Function Tests: No results for input(s): TSH, T4TOTAL, FREET4, T3FREE, THYROIDAB in the last 72 hours. Anemia Panel: No results for input(s): VITAMINB12, FOLATE, FERRITIN, TIBC, IRON, RETICCTPCT in the last 72 hours. Sepsis Labs: No results for input(s): PROCALCITON, LATICACIDVEN in the last 168 hours.  No results found for this or any previous visit (from the past 240 hour(s)).       Radiology Studies: Mr Lumbar Spine W Wo Contrast  Result Date: 11/11/2017 CLINICAL DATA:  Back pain.  Cauda equina syndrome suspected. EXAM: MRI LUMBAR SPINE WITHOUT AND WITH CONTRAST TECHNIQUE: Multiplanar and multiecho pulse sequences of the lumbar spine were obtained without and with intravenous contrast. CONTRAST:  16 mL MultiHance. COMPARISON:  CT abdomen and pelvis 11/06/2017 FINDINGS: Segmentation: 5 non rib-bearing lumbar type vertebral bodies are present. Alignment:  AP alignment is anatomic. Vertebrae:  Marrow signal and vertebral body heights are normal. Conus medullaris and cauda equina: Conus extends to the L1-2 level. Conus and cauda equina appear normal. Paraspinal and other soft tissues: Limited imaging of the abdomen is unremarkable. There is no significant adenopathy. Disc levels: L1-2: Negative. L2-3: Negative. L3-4: Mild facet hypertrophy is present bilaterally. No significant disc protrusion or stenosis is present. L4-5: A right paramedian disc protrusion and annular tear is present. There is potential impact on the traversing right L5 nerve roots. The foramina are patent.  L5-S1: A shallow central disc protrusion and annular tear is present without focal stenosis. The foramina are patent. IMPRESSION: 1. Right paramedian disc protrusion and annular tear at L4-5 with potential impact on the traversing right L5 nerve roots. 2. Shallow central disc protrusion and annular tear at L5-S1 without focal stenosis. Electronically Signed   By: San Morelle M.D.   On: 11/11/2017 12:04   Ct Abdomen Pelvis W Contrast  Result Date: 11/09/2017 CLINICAL DATA:  Lower pelvic pain, diagnosed with bladder cancer 2 months ago, undergoing intravesicular treatments, history coronary artery disease post MI, diabetes mellitus, hypertension EXAM: CT ABDOMEN AND PELVIS WITH CONTRAST TECHNIQUE: Multidetector CT imaging of the abdomen and pelvis was performed using the standard protocol following bolus administration of intravenous contrast. Sagittal and coronal MPR images reconstructed from axial data set. CONTRAST:  160mL ISOVUE-300 IOPAMIDOL (ISOVUE-300) INJECTION 61% IV. No oral contrast. COMPARISON:  01/05/2017 FINDINGS: Lower chest: Lung bases clear Hepatobiliary: Gallbladder and liver normal appearance Pancreas: Normal appearance Spleen: Normal appearance Adrenals/Urinary Tract: Minimal chronic thickening of LEFT adrenal gland without discrete mass. Kidneys  and ureters normal appearance. Foley catheter decompresses urinary bladder. Bladder mass identified on the previous exam not visualized. Stomach/Bowel: Appendix surgically absent by history. Stomach and bowel loops normal appearance Vascular/Lymphatic: Atherosclerotic calcifications aorta and coronary arteries without aortic aneurysm. Few normal sized to mildly enlarged lymph nodes at the LEFT inguinal region. Reproductive: Mild prostatic enlargement gland measuring 5.9 x 4.1 x 3.2 cm. Seminal vesicles unremarkable. Other: Infiltrative changes at LEFT inguinal region, by history inguinal herniorrhaphy 08/15/2017. Small focal fluid collection  at LEFT inguinal hernia repair 21 x 9 mm image 83 question seroma or lymphocele, infection considered less likely this far removed from surgery though not completely excluded. No free intraperitoneal air or fluid. Musculoskeletal: Unremarkable IMPRESSION: Prostatic enlargement. No acute intra-abdominal or intrapelvic abnormalities. Postoperative changes of LEFT inguinal herniorrhaphy with reactive lymph nodes and a 21 x 9 mm fluid collection at the surgical bed favor seroma versus lymphocele as discussed above. Aortic Atherosclerosis (ICD10-I70.0). Electronically Signed   By: Lavonia Dana M.D.   On: 11/12/2017 16:55        Scheduled Meds: . aspirin  325 mg Oral QHS  . clopidogrel  75 mg Oral QHS  . enoxaparin (LOVENOX) injection  40 mg Subcutaneous Q24H  . ezetimibe  10 mg Oral QHS  . feeding supplement (ENSURE ENLIVE)  237 mL Oral BID BM  . insulin aspart  0-9 Units Subcutaneous TID WC  . insulin aspart protamine- aspart  30 Units Subcutaneous BID WC  . irbesartan  300 mg Oral Daily  . metoprolol succinate  25 mg Oral QHS  . rosuvastatin  40 mg Oral QHS  . tamsulosin  0.4 mg Oral QHS   Continuous Infusions: . sodium chloride 75 mL/hr at 11/06/2017 2330     LOS: 0 days    Time spent: 30 minutes    Jaysiah Marchetta Darleen Crocker, DO [facility] Pager [phone]  If 7PM-7AM, please contact night-coverage [email or web address] Password [email or web address] 11/11/2017, 1:11 PM

## 2017-11-11 NOTE — Plan of Care (Signed)
  Acute Rehab PT Goals(only PT should resolve) Patient Will Perform Sitting Balance 11/11/2017 1445 - Progressing by Jeannie Done, PT Patient Will Transfer Sit To/From Stand 11/11/2017 1445 - Progressing by Jeannie Done, PT Flowsheets Taken 11/11/2017 1445  Patient will transfer sit to/from stand with min guard assist Note With RW Pt Will Transfer Bed To Chair/Chair To Bed 11/11/2017 1445 - Progressing by Hartnett-Rands, Pamala Hurry, PT Flowsheets Taken 11/11/2017 1445  Pt will Transfer Bed to Chair/Chair to Bed min guard assist Note With RW Pt Will Ambulate 11/11/2017 1445 - Progressing by Jeannie Done, PT Flowsheets Taken 11/11/2017 1445  Pt will Ambulate 100 feet;with min guard assist Note With RW   Pamala Hurry D. Hartnett-Rands, MS, PT Per [place] [phone]

## 2017-11-11 NOTE — Evaluation (Signed)
Physical Therapy Evaluation Patient Details Name: Raymond [name] MRN: [id number] DOB: 05/02/1956 Today's Date: 11/11/2017   History of Present Illness   This is a 62 year old male with recent TURP secondary to bladder tumor who presented to the hospital with generalized weakness, pelvic pain and numbness for past 3 weeks.  He has had significant urinary retention with recent Foley catheter placement.  He was noted to have significant generalized weakness with serum sodium of 122 for which he was placed on IV normal saline with some improvement this morning.  In speaking with his primary care provider Dr. Mannie Stabile, there was concern about some possible cauda equina and MRI of the lumbar spine was performed demonstrating some lower lumbar findings, but no cauda equina at the present.   Clinical Impression  Patient is a 62 year old male presenting to this venue with symptoms of hyponatremia. Patient presents with deficits in strength, sensation, balance, endurance, coordination, transfer and gait mobility skills. Patient does have support at home from his wife but she works out of the home and is afraid she may not be able to help him to the amount he needs if he goes home from hospital. Patient had two falls at home prior to admission - one out of bed next to wife and other in shower.  Patient would continue to benefit from skilled physical therapy in current environment and next venue to continue return to prior function and increase strength, endurance, balance, coordination, and functional mobility and gait skills.     Follow Up Recommendations SNF;Supervision/Assistance - 24 hour    Equipment Recommendations  None recommended by PT    Recommendations for Other Services       Precautions / Restrictions Precautions Precautions: Fall Restrictions Weight Bearing Restrictions: No      Mobility  Bed Mobility Overal bed mobility: Modified Independent                Transfers Overall  transfer level: Needs assistance Equipment used: Rolling walker (2 wheeled) Transfers: Sit to/from [facility] Sit to Stand: Min assist Stand pivot transfers: Mod assist          Ambulation/Gait             General Gait Details: Not tested today due to patient feeling weak and nauseated. Will assess on subsequent PT sessions when patient is feeling better.   Stairs            Wheelchair Mobility    Modified Rankin (Stroke Patients Only)       Balance Overall balance assessment: Needs assistance(wife reoprts 2 falls in the last 2 weeks with current illness) Sitting-balance support: Bilateral upper extremity supported Sitting balance-Leahy Scale: Good Sitting balance - Comments: patient generally fatigued   Standing balance support: Bilateral upper extremity supported;During functional activity Standing balance-Leahy Scale: Poor Standing balance comment: lower extremities exhibiting weakness and difficulty coordinating to keep patient in standing position                             Pertinent Vitals/Pain Pain Assessment: Faces Pain Location: abdomen;  Pain Intervention(s): Limited activity within patient's tolerance;Monitored during session    Home Living Family/patient expects to be discharged to:: Private residence Living Arrangements: Spouse/significant other Available Help at Discharge: Family(but wife works outside the home)   Home Access: (one small 2" step to enter home)     Home Layout: One level Home Equipment: Cane - single point;Bedside  commode;Walker - 4 wheels;Walker - standard      Prior Function Level of Independence: Independent               Hand Dominance        Extremity/Trunk Assessment        Lower Extremity Assessment Lower Extremity Assessment: RLE deficits/detail;Generalized weakness RLE Deficits / Details: patient also complained of numbness in buttock region when sitting. Patient  appears to be lacking deep pressure sensation on RT side especially toes and exhibits weakness in RT anterior tibialis RLE Sensation: decreased light touch       Communication   Communication: No difficulties  Cognition Arousal/Alertness: Awake/alert Behavior During Therapy: WFL for tasks assessed/performed Overall Cognitive Status: Within Functional Limits for tasks assessed                                        General Comments      Exercises General Exercises - Upper Extremity Shoulder Flexion: Supine;AROM;Strengthening;Both;10 reps General Exercises - Lower Extremity Ankle Circles/Pumps: Seated;AROM;Strengthening;Both;10 reps Long Arc Quad: Seated;AROM;Strengthening;Both;10 reps Hip Flexion/Marching: Seated;AROM;Strengthening;Both;10 reps(PT limited motionn as patient complaining of pain with further excursion of movement)   Assessment/Plan    PT Assessment Patient needs continued PT services  PT Problem List Decreased strength;Decreased activity tolerance;Decreased balance;Decreased mobility;Decreased coordination;Impaired sensation;Pain       PT Treatment Interventions Gait training;Functional mobility training;Therapeutic activities;Therapeutic exercise;Patient/family education;Balance training    PT Goals (Current goals can be found in the Care Plan section)  Acute Rehab PT Goals Patient Stated Goal: return home with PLOF PT Goal Formulation: With patient/family Time For Goal Achievement: 11/25/17 Potential to Achieve Goals: Good    Frequency Min 3X/week   Barriers to discharge        Co-evaluation               AM-PAC PT "6 Clicks" Daily Activity  Outcome Measure Difficulty turning over in bed (including adjusting bedclothes, sheets and blankets)?: A Little Difficulty moving from lying on back to sitting on the side of the bed? : A Little Difficulty sitting down on and standing up from a chair with arms (e.g., wheelchair, bedside  commode, etc,.)?: A Little Help needed moving to and from a bed to chair (including a wheelchair)?: A Lot Help needed walking in hospital room?: A Lot Help needed climbing 3-5 steps with a railing? : A Lot 6 Click Score: 15    End of Session Equipment Utilized During Treatment: Gait belt Activity Tolerance: Patient limited by pain;Patient limited by fatigue Patient left: in bed;with call bell/phone within reach;with family/visitor present Nurse Communication: Mobility status PT Visit Diagnosis: Unsteadiness on feet (R26.81);Difficulty in walking, not elsewhere classified (R26.2);Repeated falls (R29.6);Muscle weakness (generalized) (M62.81)    Time: 6967-8938 PT Time Calculation (min) (ACUTE ONLY): 40 min   Charges:   PT Evaluation $PT Eval Low Complexity: 1 Low     PT G Codes:        Tyaira Heward D. Hartnett-Rands, MS, PT Per [place] #[id number] 11/11/2017, 2:37 PM

## 2017-11-11 NOTE — Progress Notes (Signed)
Inpatient Diabetes Program Recommendations  AACE/ADA: New Consensus Statement on Inpatient Glycemic Control (2015)  Target Ranges:  Prepandial:   less than 140 mg/dL      Peak postprandial:   less than 180 mg/dL (1-2 hours)      Critically ill patients:  140 - 180 mg/dL   Lab Results  Component Value Date   GLUCAP 338 (H) 11/11/2017   HGBA1C 8.7 (H) 08/12/2017    Review of Glycemic Control Results for Raymond [name], Raymond [name] (MRN [id number]) as of 11/11/2017 10:44  Ref. Range 11/11/2017 09:16  Glucose-Capillary Latest Ref Range: 65 - 99 mg/dL 338 (H)   Diabetes history: Type 2 DM Outpatient Diabetes medications: Metformin 1,000 mg BID, Humalog 75/25 30-35 Units BID, Victoza 1.8 mg QD Current orders for Inpatient glycemic control: Novolog 70/30 30 Units BID, Novolog 0-9 Units TIDAC,   Inpatient Diabetes Program Recommendations:    Patient recently started on insulin regimen while inpatient 2/14-2200. Patient did not receive  A dose until this morning. If FSBS continue to remain >180 mg/dL   Would also consider switching diet order to carb modified. Spoke with nurse to confirm that patient was consuming meal.   Thanks, Bronson Curb, MSN, RNC-OB Diabetes Coordinator [phone] (8a-5p)

## 2017-11-12 DIAGNOSIS — E43 Unspecified severe protein-calorie malnutrition: Secondary | ICD-10-CM

## 2017-11-12 DIAGNOSIS — E871 Hypo-osmolality and hyponatremia: Secondary | ICD-10-CM | POA: Diagnosis not present

## 2017-11-12 LAB — GLUCOSE, CAPILLARY
GLUCOSE-CAPILLARY: 70 mg/dL (ref 65–99)
Glucose-Capillary: 143 mg/dL — ABNORMAL HIGH (ref 65–99)
Glucose-Capillary: 259 mg/dL — ABNORMAL HIGH (ref 65–99)
Glucose-Capillary: 140 mg/dL — ABNORMAL HIGH (ref 65–99)

## 2017-11-12 LAB — BASIC METABOLIC PANEL
ANION GAP: 12 (ref 5–15)
BUN: 20 mg/dL (ref 6–20)
CALCIUM: 9.2 mg/dL (ref 8.9–10.3)
CO2: 25 mmol/L (ref 22–32)
CREATININE: 0.79 mg/dL (ref 0.61–1.24)
Chloride: 95 mmol/L — ABNORMAL LOW (ref 101–111)
GFR calc Af Amer: >60 mL/min (ref >60)
GLUCOSE: 117 mg/dL — AB (ref 65–99)
Potassium: 4 mmol/L (ref 3.5–5.1)
Sodium: 132 mmol/L — ABNORMAL LOW (ref 135–145)

## 2017-11-12 LAB — CBC
HCT: 39.4 % (ref 39.0–52.0)
Hemoglobin: 13.1 g/dL (ref 13.0–17.0)
MCH: 28.4 pg (ref 26.0–34.0)
MCHC: 33.2 g/dL (ref 30.0–36.0)
MCV: 85.3 fL (ref 78.0–100.0)
PLATELETS: 311 10*3/uL (ref 150–400)
RBC: 4.62 MIL/uL (ref 4.22–5.81)
RDW: 13.4 % (ref 11.5–15.5)
WBC: 10.7 10*3/uL — ABNORMAL HIGH (ref 4.0–10.5)

## 2017-11-12 LAB — HEMOGLOBIN A1C
HEMOGLOBIN A1C: 9.1 % — AB (ref 4.8–5.6)
MEAN PLASMA GLUCOSE: 214.47 mg/dL

## 2017-11-12 LAB — HIV ANTIBODY (ROUTINE TESTING W REFLEX): HIV SCREEN 4TH GENERATION: NONREACTIVE

## 2017-11-12 MED ORDER — FENTANYL 12 MCG/HR TD PT72
12.5000 ug | MEDICATED_PATCH | TRANSDERMAL | Status: DC
  Administered 2017-11-12: 12.5 ug via TRANSDERMAL
  Filled 2017-11-12: qty 1

## 2017-11-12 MED ORDER — FLEET ENEMA 7-19 GM/118ML RE ENEM
1.0000 | ENEMA | Freq: Once | RECTAL | Status: AC
  Administered 2017-11-12: 1 via RECTAL

## 2017-11-12 MED ORDER — INSULIN ASPART 100 UNIT/ML ~~LOC~~ SOLN
0.0000 [IU] | Freq: Three times a day (TID) | SUBCUTANEOUS | Status: DC
  Administered 2017-11-12: 11 [IU] via SUBCUTANEOUS
  Administered 2017-11-12 (×2): 3 [IU] via SUBCUTANEOUS
  Administered 2017-11-13 (×2): 4 [IU] via SUBCUTANEOUS
  Administered 2017-11-13: 7 [IU] via SUBCUTANEOUS
  Administered 2017-11-14 – 2017-11-15 (×2): 4 [IU] via SUBCUTANEOUS
  Administered 2017-11-15: 3 [IU] via SUBCUTANEOUS
  Administered 2017-11-16 (×3): 4 [IU] via SUBCUTANEOUS
  Administered 2017-11-17: 7 [IU] via SUBCUTANEOUS
  Administered 2017-11-17: 11 [IU] via SUBCUTANEOUS
  Administered 2017-11-17 – 2017-11-18 (×3): 7 [IU] via SUBCUTANEOUS
  Administered 2017-11-18 – 2017-11-19 (×2): 11 [IU] via SUBCUTANEOUS
  Administered 2017-11-19: 15 [IU] via SUBCUTANEOUS
  Administered 2017-11-19: 7 [IU] via SUBCUTANEOUS
  Administered 2017-11-20: 11 [IU] via SUBCUTANEOUS
  Administered 2017-11-20 (×2): 15 [IU] via SUBCUTANEOUS

## 2017-11-12 NOTE — Progress Notes (Signed)
PROGRESS NOTE    Raymond [name]  JME:[id number] DOB: 06-Oct-1955 DOA: 11/12/2017 PCP: Caren Macadam, MD   Brief Narrative:   This is a 62 year old male with recent TURP secondary to bladder tumor who presented to the hospital with generalized weakness, pelvic pain and numbness for past 3 weeks.  He has had significant urinary retention with recent Foley catheter placement.  He was noted to have significant generalized weakness with serum sodium of 122 for which he was placed on IV normal saline with further improvement this morning.  In speaking with his primary care provider Dr. Mannie Stabile, there was concern about some possible cauda equina and MRI of the lumbar spine was performed demonstrating some lower lumbar findings, but no cauda equina at the present. He continues to have weakness and is working with PT. He continues to have some pain for which Fentanyl patch will be added.  Assessment & Plan:   Active Problems:   Hyponatremia   Protein-calorie malnutrition, severe   1. Generalized weakness secondary to hyponatremia-improving.  Continue IV normal saline and follow-up serum sodium levels in am.  This appears to be related to poor oral intake.  PT evaluation ongoing and he will likely require SNF/Rehab bed.  2. Pelvic floor pain-ongoing.  Continue Percocet every 6 hours as needed along with fentanyl patch which has been added. 3. Urinary retention secondary to BPH.  Continue Foley catheter. 4. Constipation. Enema today. 5. Status post transurethral resection of bladder tumor.  Followed by urology as an outpatient. 6. Diabetes.  Continue on insulin as prescribed with good control noted.   DVT prophylaxis:Lovenox Code Status: Full code Family Communication: Wife at bedside Disposition Plan: Continue IV NS for hyponatremia; PT evaluation   Consultants:   None  Procedures:   None  Antimicrobials:   None   Subjective: Patient seen and evaluated today with no new acute  complaints or concerns. No acute concerns or events noted overnight. Continues to have pain that is poorly controlled.  Objective: Vitals:   11/03/2017 2300 11/11/17 0645 11/11/17 2056 11/12/17 0643  BP: (!) 147/68 (!) 141/68 132/70 136/69  Pulse: 88 73 77 67  Resp: 18 16 17 16   Temp: 98.6 F (37 C) 98.4 F (36.9 C) 98.5 F (36.9 C) 98 F (36.7 C)  TempSrc: Oral  Oral Oral  SpO2: 98% 100% 98% 99%  Weight: 81.2 kg (179 lb 0.2 oz)     Height: 6\' 1"  (1.854 m)       Intake/Output Summary (Last 24 hours) at 11/12/2017 1338 Last data filed at 11/12/2017 1242 Gross per 24 hour  Intake 480 ml  Output 4200 ml  Net -3720 ml   Filed Weights   11/16/2017 2300  Weight: 81.2 kg (179 lb 0.2 oz)    Examination:  General exam: Appears calm and comfortable  Respiratory system: Clear to auscultation. Respiratory effort normal. Cardiovascular system: S1 & S2 heard, RRR. No JVD, murmurs, rubs, gallops or clicks. No pedal edema. Gastrointestinal system: Abdomen is nondistended, soft and nontender. No organomegaly or masses felt. Normal bowel sounds heard. Central nervous system: Alert and oriented. No focal neurological deficits. Extremities: Symmetric 5 x 5 power. Skin: No rashes, lesions or ulcers; foley with clear, yellow UO noted    Data Reviewed: I have personally reviewed following labs and imaging studies  CBC: Recent Labs  Lab 11/09/2017 1112 11/11/17 0449 11/12/17 0659  WBC 12.9* 10.0 10.7*  NEUTROABS 11.2*  --   --   HGB 13.7 13.3 13.1  HCT 39.9 38.7* 39.4  MCV 83.6 83.6 85.3  PLT 326 311 741   Basic Metabolic Panel: Recent Labs  Lab 11/23/2017 1112 11/11/17 0449 11/11/17 1332 11/12/17 0659  NA 122* 127* 129* 132*  K 4.7 4.2 4.2 4.0  CL 85* 90* 92* 95*  CO2 24 23 26 25   GLUCOSE 305* 252* 299* 117*  BUN 24* 18 22* 20  CREATININE 0.91 0.89 0.91 0.79  CALCIUM 9.3 8.8* 9.3 9.2   GFR: Estimated Creatinine Clearance: 106.8 mL/min (by C-G formula based on SCr of 0.79  mg/dL). Liver Function Tests: Recent Labs  Lab 11/02/2017 1112 11/11/17 0449  AST 28 23  ALT 32 27  ALKPHOS 56 57  BILITOT 0.8 1.0  PROT 7.4 6.6  ALBUMIN 3.9 3.5   Recent Labs  Lab 11/16/2017 1112  LIPASE 44   No results for input(s): AMMONIA in the last 168 hours. Coagulation Profile: No results for input(s): INR, PROTIME in the last 168 hours. Cardiac Enzymes: No results for input(s): CKTOTAL, CKMB, CKMBINDEX, TROPONINI in the last 168 hours. BNP (last 3 results) No results for input(s): PROBNP in the last 8760 hours. HbA1C: Recent Labs    11/23/2017 1112  HGBA1C 9.2*   CBG: Recent Labs  Lab 11/11/17 1151 11/11/17 1636 11/11/17 2054 11/12/17 0802 11/12/17 1139  GLUCAP 326* 157* 110* 143* 259*   Lipid Profile: No results for input(s): CHOL, HDL, LDLCALC, TRIG, CHOLHDL, LDLDIRECT in the last 72 hours. Thyroid Function Tests: No results for input(s): TSH, T4TOTAL, FREET4, T3FREE, THYROIDAB in the last 72 hours. Anemia Panel: No results for input(s): VITAMINB12, FOLATE, FERRITIN, TIBC, IRON, RETICCTPCT in the last 72 hours. Sepsis Labs: No results for input(s): PROCALCITON, LATICACIDVEN in the last 168 hours.  No results found for this or any previous visit (from the past 240 hour(s)).       Radiology Studies: Mr Lumbar Spine W Wo Contrast  Result Date: 11/11/2017 CLINICAL DATA:  Back pain.  Cauda equina syndrome suspected. EXAM: MRI LUMBAR SPINE WITHOUT AND WITH CONTRAST TECHNIQUE: Multiplanar and multiecho pulse sequences of the lumbar spine were obtained without and with intravenous contrast. CONTRAST:  16 mL MultiHance. COMPARISON:  CT abdomen and pelvis 11/07/2017 FINDINGS: Segmentation: 5 non rib-bearing lumbar type vertebral bodies are present. Alignment:  AP alignment is anatomic. Vertebrae:  Marrow signal and vertebral body heights are normal. Conus medullaris and cauda equina: Conus extends to the L1-2 level. Conus and cauda equina appear normal.  Paraspinal and other soft tissues: Limited imaging of the abdomen is unremarkable. There is no significant adenopathy. Disc levels: L1-2: Negative. L2-3: Negative. L3-4: Mild facet hypertrophy is present bilaterally. No significant disc protrusion or stenosis is present. L4-5: A right paramedian disc protrusion and annular tear is present. There is potential impact on the traversing right L5 nerve roots. The foramina are patent. L5-S1: A shallow central disc protrusion and annular tear is present without focal stenosis. The foramina are patent. IMPRESSION: 1. Right paramedian disc protrusion and annular tear at L4-5 with potential impact on the traversing right L5 nerve roots. 2. Shallow central disc protrusion and annular tear at L5-S1 without focal stenosis. Electronically Signed   By: San Morelle M.D.   On: 11/11/2017 12:04   Ct Abdomen Pelvis W Contrast  Result Date: 11/01/2017 CLINICAL DATA:  Lower pelvic pain, diagnosed with bladder cancer 2 months ago, undergoing intravesicular treatments, history coronary artery disease post MI, diabetes mellitus, hypertension EXAM: CT ABDOMEN AND PELVIS WITH CONTRAST TECHNIQUE: Multidetector CT imaging  of the abdomen and pelvis was performed using the standard protocol following bolus administration of intravenous contrast. Sagittal and coronal MPR images reconstructed from axial data set. CONTRAST:  187mL ISOVUE-300 IOPAMIDOL (ISOVUE-300) INJECTION 61% IV. No oral contrast. COMPARISON:  01/05/2017 FINDINGS: Lower chest: Lung bases clear Hepatobiliary: Gallbladder and liver normal appearance Pancreas: Normal appearance Spleen: Normal appearance Adrenals/Urinary Tract: Minimal chronic thickening of LEFT adrenal gland without discrete mass. Kidneys and ureters normal appearance. Foley catheter decompresses urinary bladder. Bladder mass identified on the previous exam not visualized. Stomach/Bowel: Appendix surgically absent by history. Stomach and bowel loops  normal appearance Vascular/Lymphatic: Atherosclerotic calcifications aorta and coronary arteries without aortic aneurysm. Few normal sized to mildly enlarged lymph nodes at the LEFT inguinal region. Reproductive: Mild prostatic enlargement gland measuring 5.9 x 4.1 x 3.2 cm. Seminal vesicles unremarkable. Other: Infiltrative changes at LEFT inguinal region, by history inguinal herniorrhaphy 08/15/2017. Small focal fluid collection at LEFT inguinal hernia repair 21 x 9 mm image 83 question seroma or lymphocele, infection considered less likely this far removed from surgery though not completely excluded. No free intraperitoneal air or fluid. Musculoskeletal: Unremarkable IMPRESSION: Prostatic enlargement. No acute intra-abdominal or intrapelvic abnormalities. Postoperative changes of LEFT inguinal herniorrhaphy with reactive lymph nodes and a 21 x 9 mm fluid collection at the surgical bed favor seroma versus lymphocele as discussed above. Aortic Atherosclerosis (ICD10-I70.0). Electronically Signed   By: Lavonia Dana M.D.   On: 11/03/2017 16:55        Scheduled Meds: . aspirin  325 mg Oral QHS  . clopidogrel  75 mg Oral QHS  . enoxaparin (LOVENOX) injection  40 mg Subcutaneous Q24H  . ezetimibe  10 mg Oral QHS  . feeding supplement (GLUCERNA SHAKE)  237 mL Oral TID BM  . fentaNYL  12.5 mcg Transdermal Q72H  . insulin aspart  0-20 Units Subcutaneous TID WC  . insulin aspart protamine- aspart  30 Units Subcutaneous BID WC  . irbesartan  300 mg Oral Daily  . metoprolol succinate  25 mg Oral QHS  . rosuvastatin  40 mg Oral QHS  . sodium phosphate  1 enema Rectal Once  . tamsulosin  0.4 mg Oral QHS   Continuous Infusions: . sodium chloride 75 mL/hr at 11/12/17 1119     LOS: 0 days    Time spent: 30 minutes    Kolina Kube Darleen Crocker, DO [facility] Pager [phone]  If 7PM-7AM, please contact night-coverage [email or web address] Password [facility] 11/12/2017, 1:38 PM

## 2017-11-13 DIAGNOSIS — M1712 Unilateral primary osteoarthritis, left knee: Secondary | ICD-10-CM | POA: Diagnosis not present

## 2017-11-13 DIAGNOSIS — C800 Disseminated malignant neoplasm, unspecified: Secondary | ICD-10-CM | POA: Diagnosis not present

## 2017-11-13 DIAGNOSIS — J9601 Acute respiratory failure with hypoxia: Secondary | ICD-10-CM | POA: Diagnosis not present

## 2017-11-13 DIAGNOSIS — R209 Unspecified disturbances of skin sensation: Secondary | ICD-10-CM | POA: Diagnosis not present

## 2017-11-13 DIAGNOSIS — I252 Old myocardial infarction: Secondary | ICD-10-CM | POA: Diagnosis not present

## 2017-11-13 DIAGNOSIS — R0602 Shortness of breath: Secondary | ICD-10-CM | POA: Diagnosis not present

## 2017-11-13 DIAGNOSIS — J9690 Respiratory failure, unspecified, unspecified whether with hypoxia or hypercapnia: Secondary | ICD-10-CM | POA: Diagnosis not present

## 2017-11-13 DIAGNOSIS — R338 Other retention of urine: Secondary | ICD-10-CM | POA: Diagnosis present

## 2017-11-13 DIAGNOSIS — M5124 Other intervertebral disc displacement, thoracic region: Secondary | ICD-10-CM | POA: Diagnosis not present

## 2017-11-13 DIAGNOSIS — M5432 Sciatica, left side: Secondary | ICD-10-CM | POA: Diagnosis not present

## 2017-11-13 DIAGNOSIS — Z4682 Encounter for fitting and adjustment of non-vascular catheter: Secondary | ICD-10-CM | POA: Diagnosis not present

## 2017-11-13 DIAGNOSIS — R918 Other nonspecific abnormal finding of lung field: Secondary | ICD-10-CM | POA: Diagnosis not present

## 2017-11-13 DIAGNOSIS — J189 Pneumonia, unspecified organism: Secondary | ICD-10-CM | POA: Diagnosis not present

## 2017-11-13 DIAGNOSIS — J181 Lobar pneumonia, unspecified organism: Secondary | ICD-10-CM | POA: Diagnosis not present

## 2017-11-13 DIAGNOSIS — I2510 Atherosclerotic heart disease of native coronary artery without angina pectoris: Secondary | ICD-10-CM | POA: Diagnosis present

## 2017-11-13 DIAGNOSIS — E43 Unspecified severe protein-calorie malnutrition: Secondary | ICD-10-CM | POA: Diagnosis not present

## 2017-11-13 DIAGNOSIS — R4020 Unspecified coma: Secondary | ICD-10-CM | POA: Diagnosis not present

## 2017-11-13 DIAGNOSIS — D630 Anemia in neoplastic disease: Secondary | ICD-10-CM | POA: Diagnosis present

## 2017-11-13 DIAGNOSIS — C7949 Secondary malignant neoplasm of other parts of nervous system: Secondary | ICD-10-CM | POA: Diagnosis not present

## 2017-11-13 DIAGNOSIS — M19041 Primary osteoarthritis, right hand: Secondary | ICD-10-CM | POA: Diagnosis not present

## 2017-11-13 DIAGNOSIS — M1990 Unspecified osteoarthritis, unspecified site: Secondary | ICD-10-CM | POA: Diagnosis present

## 2017-11-13 DIAGNOSIS — R296 Repeated falls: Secondary | ICD-10-CM | POA: Diagnosis not present

## 2017-11-13 DIAGNOSIS — G910 Communicating hydrocephalus: Secondary | ICD-10-CM | POA: Diagnosis present

## 2017-11-13 DIAGNOSIS — E7800 Pure hypercholesterolemia, unspecified: Secondary | ICD-10-CM | POA: Diagnosis present

## 2017-11-13 DIAGNOSIS — N401 Enlarged prostate with lower urinary tract symptoms: Secondary | ICD-10-CM | POA: Diagnosis present

## 2017-11-13 DIAGNOSIS — J9602 Acute respiratory failure with hypercapnia: Secondary | ICD-10-CM | POA: Diagnosis not present

## 2017-11-13 DIAGNOSIS — N390 Urinary tract infection, site not specified: Secondary | ICD-10-CM | POA: Diagnosis not present

## 2017-11-13 DIAGNOSIS — Z7189 Other specified counseling: Secondary | ICD-10-CM | POA: Diagnosis not present

## 2017-11-13 DIAGNOSIS — R569 Unspecified convulsions: Secondary | ICD-10-CM | POA: Diagnosis not present

## 2017-11-13 DIAGNOSIS — E871 Hypo-osmolality and hyponatremia: Secondary | ICD-10-CM | POA: Diagnosis not present

## 2017-11-13 DIAGNOSIS — M6281 Muscle weakness (generalized): Secondary | ICD-10-CM | POA: Diagnosis not present

## 2017-11-13 DIAGNOSIS — I472 Ventricular tachycardia: Secondary | ICD-10-CM | POA: Diagnosis not present

## 2017-11-13 DIAGNOSIS — Z515 Encounter for palliative care: Secondary | ICD-10-CM | POA: Diagnosis not present

## 2017-11-13 DIAGNOSIS — J690 Pneumonitis due to inhalation of food and vomit: Secondary | ICD-10-CM | POA: Diagnosis not present

## 2017-11-13 DIAGNOSIS — M5127 Other intervertebral disc displacement, lumbosacral region: Secondary | ICD-10-CM | POA: Diagnosis present

## 2017-11-13 DIAGNOSIS — A419 Sepsis, unspecified organism: Secondary | ICD-10-CM | POA: Diagnosis not present

## 2017-11-13 DIAGNOSIS — E46 Unspecified protein-calorie malnutrition: Secondary | ICD-10-CM | POA: Diagnosis not present

## 2017-11-13 DIAGNOSIS — J9811 Atelectasis: Secondary | ICD-10-CM | POA: Diagnosis not present

## 2017-11-13 DIAGNOSIS — M19042 Primary osteoarthritis, left hand: Secondary | ICD-10-CM | POA: Diagnosis not present

## 2017-11-13 DIAGNOSIS — R630 Anorexia: Secondary | ICD-10-CM | POA: Diagnosis not present

## 2017-11-13 DIAGNOSIS — Z66 Do not resuscitate: Secondary | ICD-10-CM | POA: Diagnosis present

## 2017-11-13 DIAGNOSIS — G4700 Insomnia, unspecified: Secondary | ICD-10-CM | POA: Diagnosis present

## 2017-11-13 DIAGNOSIS — Z9911 Dependence on respirator [ventilator] status: Secondary | ICD-10-CM | POA: Diagnosis not present

## 2017-11-13 DIAGNOSIS — Z978 Presence of other specified devices: Secondary | ICD-10-CM | POA: Diagnosis not present

## 2017-11-13 DIAGNOSIS — I10 Essential (primary) hypertension: Secondary | ICD-10-CM | POA: Diagnosis present

## 2017-11-13 DIAGNOSIS — G610 Guillain-Barre syndrome: Secondary | ICD-10-CM | POA: Diagnosis not present

## 2017-11-13 DIAGNOSIS — N319 Neuromuscular dysfunction of bladder, unspecified: Secondary | ICD-10-CM | POA: Diagnosis not present

## 2017-11-13 DIAGNOSIS — M5431 Sciatica, right side: Secondary | ICD-10-CM | POA: Diagnosis not present

## 2017-11-13 DIAGNOSIS — G9341 Metabolic encephalopathy: Secondary | ICD-10-CM | POA: Diagnosis not present

## 2017-11-13 DIAGNOSIS — L89151 Pressure ulcer of sacral region, stage 1: Secondary | ICD-10-CM | POA: Diagnosis not present

## 2017-11-13 DIAGNOSIS — G038 Meningitis due to other specified causes: Secondary | ICD-10-CM | POA: Diagnosis present

## 2017-11-13 DIAGNOSIS — D649 Anemia, unspecified: Secondary | ICD-10-CM | POA: Diagnosis not present

## 2017-11-13 DIAGNOSIS — E873 Alkalosis: Secondary | ICD-10-CM | POA: Diagnosis not present

## 2017-11-13 DIAGNOSIS — C801 Malignant (primary) neoplasm, unspecified: Secondary | ICD-10-CM | POA: Diagnosis not present

## 2017-11-13 DIAGNOSIS — E785 Hyperlipidemia, unspecified: Secondary | ICD-10-CM | POA: Diagnosis not present

## 2017-11-13 DIAGNOSIS — R4182 Altered mental status, unspecified: Secondary | ICD-10-CM | POA: Diagnosis not present

## 2017-11-13 DIAGNOSIS — G009 Bacterial meningitis, unspecified: Secondary | ICD-10-CM | POA: Diagnosis not present

## 2017-11-13 DIAGNOSIS — R1030 Lower abdominal pain, unspecified: Secondary | ICD-10-CM | POA: Diagnosis not present

## 2017-11-13 DIAGNOSIS — E119 Type 2 diabetes mellitus without complications: Secondary | ICD-10-CM | POA: Diagnosis not present

## 2017-11-13 LAB — CBC
HEMATOCRIT: 38.8 % — AB (ref 39.0–52.0)
Hemoglobin: 12.7 g/dL — ABNORMAL LOW (ref 13.0–17.0)
MCH: 28 pg (ref 26.0–34.0)
MCHC: 32.7 g/dL (ref 30.0–36.0)
MCV: 85.7 fL (ref 78.0–100.0)
Platelets: 292 10*3/uL (ref 150–400)
RBC: 4.53 MIL/uL (ref 4.22–5.81)
RDW: 13.3 % (ref 11.5–15.5)
WBC: 11 10*3/uL — AB (ref 4.0–10.5)

## 2017-11-13 LAB — BASIC METABOLIC PANEL
ANION GAP: 10 (ref 5–15)
BUN: 17 mg/dL (ref 6–20)
CALCIUM: 8.8 mg/dL — AB (ref 8.9–10.3)
CO2: 25 mmol/L (ref 22–32)
Chloride: 97 mmol/L — ABNORMAL LOW (ref 101–111)
Creatinine, Ser: 0.79 mg/dL (ref 0.61–1.24)
GFR calc Af Amer: >60 mL/min (ref >60)
Glucose, Bld: 155 mg/dL — ABNORMAL HIGH (ref 65–99)
POTASSIUM: 4.2 mmol/L (ref 3.5–5.1)
Sodium: 132 mmol/L — ABNORMAL LOW (ref 135–145)

## 2017-11-13 LAB — GLUCOSE, CAPILLARY
GLUCOSE-CAPILLARY: 171 mg/dL — AB (ref 65–99)
GLUCOSE-CAPILLARY: 130 mg/dL — AB (ref 65–99)
Glucose-Capillary: 229 mg/dL — ABNORMAL HIGH (ref 65–99)
Glucose-Capillary: 166 mg/dL — ABNORMAL HIGH (ref 65–99)

## 2017-11-13 MED ORDER — KETOROLAC TROMETHAMINE 30 MG/ML IJ SOLN
30.0000 mg | Freq: Four times a day (QID) | INTRAMUSCULAR | Status: DC | PRN
  Administered 2017-11-13 – 2017-11-15 (×4): 30 mg via INTRAVENOUS
  Filled 2017-11-13 (×4): qty 1

## 2017-11-13 NOTE — Progress Notes (Addendum)
PROGRESS NOTE    Raymond [name]  AST:[id number] DOB: 09/02/1956 DOA: 11/09/2017 PCP: Caren Macadam, MD   Brief Narrative:   This is a 62 year old male with recent TURP secondary to bladder tumor who presented to the hospital with generalized weakness, pelvic pain and numbness for past 3 weeks.  He has had significant urinary retention with recent Foley catheter placement.  He was noted to have significant generalized weakness with serum sodium of 122 for which he was placed on IV normal saline with further improvement this morning.  In speaking with his primary care provider Dr. Mannie Stabile, there was concern about some possible cauda equina and MRI of the lumbar spine was performed demonstrating some lower lumbar findings, but no cauda equina at the present. He continues to have weakness and is working with PT. He continues to have some pain despite use of fentanyl patch and therefore, this will be changed over to IV Toradol.  He has had a bowel movement yesterday with enema.  Assessment & Plan:   Active Problems:   Hyponatremia   Protein-calorie malnutrition, severe   1. Generalized weakness secondary to hyponatremia-improving.  Continue IV normal saline at 100cc/hr now instead of 75cc/hr and follow-up serum sodium levels in am.  This appears to be related to poor oral intake which is ongoing.  PT evaluation ongoing and he will likely require SNF/Rehab bed.  2. Pelvic floor pain-ongoing.  Continue Percocet every 6 hours as needed along with toradol IV that has been added. 3. Urinary retention secondary to BPH.  Continue Foley catheter. 4. Constipation. Enema today. 5. Status post transurethral resection of bladder tumor.  Followed by urology as an outpatient. 6. Diabetes.  Continue on insulin as prescribed with good control noted.   DVT prophylaxis:Lovenox Code Status: Full code Family Communication: Wife at bedside Disposition Plan: Continue IV NS for hyponatremia; PT  evaluation   Consultants:   None  Procedures:   None  Antimicrobials:   None   Subjective: Patient seen and evaluated today with no new acute complaints or concerns. No acute concerns or events noted overnight. Continues to have pain that is poorly controlled despite Fentanyl patch.  Objective: Vitals:   11/12/17 0643 11/12/17 1500 11/12/17 2100 11/13/17 0533  BP: 136/69 129/66 (!) 152/80 130/81  Pulse: 67 71 77 74  Resp: 16 18 17 18   Temp: 98 F (36.7 C) 98.6 F (37 C) 98.7 F (37.1 C) 98.6 F (37 C)  TempSrc: Oral Oral Oral Oral  SpO2: 99% 98% 98% 98%  Weight:      Height:        Intake/Output Summary (Last 24 hours) at 11/13/2017 1219 Last data filed at 11/13/2017 0500 Gross per 24 hour  Intake 1875 ml  Output 4050 ml  Net -2175 ml   Filed Weights   11/21/2017 2300  Weight: 81.2 kg (179 lb 0.2 oz)    Examination:  General exam: Appears calm and comfortable  Respiratory system: Clear to auscultation. Respiratory effort normal. Cardiovascular system: S1 & S2 heard, RRR. No JVD, murmurs, rubs, gallops or clicks. No pedal edema. Gastrointestinal system: Abdomen is nondistended, soft and nontender. No organomegaly or masses felt. Normal bowel sounds heard. Central nervous system: Alert and oriented. No focal neurological deficits. Extremities: Symmetric 5 x 5 power. Skin: No rashes, lesions or ulcers; foley with clear, yellow UO noted    Data Reviewed: I have personally reviewed following labs and imaging studies  CBC: Recent Labs  Lab 11/16/2017 1112 11/11/17 0449  11/12/17 0659 11/13/17 0648  WBC 12.9* 10.0 10.7* 11.0*  NEUTROABS 11.2*  --   --   --   HGB 13.7 13.3 13.1 12.7*  HCT 39.9 38.7* 39.4 38.8*  MCV 83.6 83.6 85.3 85.7  PLT 326 311 311 332   Basic Metabolic Panel: Recent Labs  Lab 11/06/2017 1112 11/11/17 0449 11/11/17 1332 11/12/17 0659 11/13/17 0648  NA 122* 127* 129* 132* 132*  K 4.7 4.2 4.2 4.0 4.2  CL 85* 90* 92* 95* 97*  CO2  24 23 26 25 25   GLUCOSE 305* 252* 299* 117* 155*  BUN 24* 18 22* 20 17  CREATININE 0.91 0.89 0.91 0.79 0.79  CALCIUM 9.3 8.8* 9.3 9.2 8.8*   GFR: Estimated Creatinine Clearance: 106.8 mL/min (by C-G formula based on SCr of 0.79 mg/dL). Liver Function Tests: Recent Labs  Lab 11/19/2017 1112 11/11/17 0449  AST 28 23  ALT 32 27  ALKPHOS 56 57  BILITOT 0.8 1.0  PROT 7.4 6.6  ALBUMIN 3.9 3.5   Recent Labs  Lab 11/07/2017 1112  LIPASE 44   No results for input(s): AMMONIA in the last 168 hours. Coagulation Profile: No results for input(s): INR, PROTIME in the last 168 hours. Cardiac Enzymes: No results for input(s): CKTOTAL, CKMB, CKMBINDEX, TROPONINI in the last 168 hours. BNP (last 3 results) No results for input(s): PROBNP in the last 8760 hours. HbA1C: Recent Labs    11/12/17 0659  HGBA1C 9.1*   CBG: Recent Labs  Lab 11/12/17 1139 11/12/17 1621 11/12/17 2206 11/13/17 0743 11/13/17 1137  GLUCAP 259* 140* 70 171* 229*   Lipid Profile: No results for input(s): CHOL, HDL, LDLCALC, TRIG, CHOLHDL, LDLDIRECT in the last 72 hours. Thyroid Function Tests: No results for input(s): TSH, T4TOTAL, FREET4, T3FREE, THYROIDAB in the last 72 hours. Anemia Panel: No results for input(s): VITAMINB12, FOLATE, FERRITIN, TIBC, IRON, RETICCTPCT in the last 72 hours. Sepsis Labs: No results for input(s): PROCALCITON, LATICACIDVEN in the last 168 hours.  No results found for this or any previous visit (from the past 240 hour(s)).       Radiology Studies: No results found.      Scheduled Meds: . aspirin  325 mg Oral QHS  . clopidogrel  75 mg Oral QHS  . enoxaparin (LOVENOX) injection  40 mg Subcutaneous Q24H  . ezetimibe  10 mg Oral QHS  . feeding supplement (GLUCERNA SHAKE)  237 mL Oral TID BM  . insulin aspart  0-20 Units Subcutaneous TID WC  . insulin aspart protamine- aspart  30 Units Subcutaneous BID WC  . irbesartan  300 mg Oral Daily  . metoprolol succinate  25  mg Oral QHS  . rosuvastatin  40 mg Oral QHS  . tamsulosin  0.4 mg Oral QHS   Continuous Infusions: . sodium chloride 75 mL/hr at 11/13/17 1200     LOS: 0 days    Time spent: 30 minutes    Latrise Bowland Darleen Crocker, DO [facility] Pager [phone]  If 7PM-7AM, please contact night-coverage [email or web address] Password [facility] 11/13/2017, 12:19 PM

## 2017-11-14 LAB — BASIC METABOLIC PANEL
Anion gap: 12 (ref 5–15)
BUN: 17 mg/dL (ref 6–20)
CHLORIDE: 98 mmol/L — AB (ref 101–111)
CO2: 25 mmol/L (ref 22–32)
CREATININE: 0.73 mg/dL (ref 0.61–1.24)
Calcium: 9.3 mg/dL (ref 8.9–10.3)
GFR calc Af Amer: >60 mL/min (ref >60)
GFR calc non Af Amer: >60 mL/min (ref >60)
GLUCOSE: 149 mg/dL — AB (ref 65–99)
POTASSIUM: 4.7 mmol/L (ref 3.5–5.1)
SODIUM: 135 mmol/L (ref 135–145)

## 2017-11-14 LAB — GLUCOSE, CAPILLARY
GLUCOSE-CAPILLARY: 145 mg/dL — AB (ref 65–99)
Glucose-Capillary: 153 mg/dL — ABNORMAL HIGH (ref 65–99)
Glucose-Capillary: 201 mg/dL — ABNORMAL HIGH (ref 65–99)

## 2017-11-14 LAB — CBC
HCT: 39.3 % (ref 39.0–52.0)
Hemoglobin: 13.1 g/dL (ref 13.0–17.0)
MCH: 28.5 pg (ref 26.0–34.0)
MCHC: 33.3 g/dL (ref 30.0–36.0)
MCV: 85.6 fL (ref 78.0–100.0)
PLATELETS: 289 10*3/uL (ref 150–400)
RBC: 4.59 MIL/uL (ref 4.22–5.81)
RDW: 13.4 % (ref 11.5–15.5)
WBC: 11.5 10*3/uL — ABNORMAL HIGH (ref 4.0–10.5)

## 2017-11-14 LAB — SEDIMENTATION RATE: SED RATE: 47 mm/h — AB (ref 0–16)

## 2017-11-14 NOTE — Consult Note (Addendum)
[place] A. Merlene Laughter, MD     [email or web address]          Raymond [name] is an 62 y.o. male.   ASSESSMENT/PLAN: 1. Subacute leg weakness that is quite severe: The presentation is most consistent with Guillain-Barr syndrome but the diagnosis is difficult because of significant underlying diabetic polyneuropathy at baseline. Other forms of acute neuropathy should also be entertained including arsenic poisoning and other heavy metal intoxication. Myelopathy is a possibility although I think this is less likely. Extensive labs will be obtained including vitamin B12 level, RPR, ANA and sedimentation rate. We should evaluate for myelopathy although I believe this is less likely. Consequently, cervical and thoracic spine MRI will be obtained. We will consider a spinal tap if imaging is unrevealing.  2. 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    The patient is 62 year old white male who presents with a 2 to 3 week history of progressive impairment. He does endorse that his legs. Heavy but he denies any numbness or tingling. The patient is quite reserved and seems to have to be prompted multiple times to obtain a history and to respond. He does seem to have a lot of pain on the evaluation involving the legs. He does not report having chest pain, palpitation or pain else. He reports that he has not been given any new medications. The review systems is otherwise unrevealing.   GENERAL: Patient appears about 70 years older than his stated age.  HEENT:  Neck is supple and no evidence of trauma  ABDOMEN: soft  EXTREMITIES: No edema   BACK: Normal  SKIN: Normal by inspection.    MENTAL STATUS: He is awake but has marked bradyphrenia. There is no spontaneous speech. He does follow commands with much prompting.  CRANIAL NERVES: Pupils are equal, round and reactive to light  and accomodation; extra ocular movements are full, there is no significant nystagmus; visual fields are full; upper and lower facial muscles are normal in strength and symmetric, there is no flattening of the nasolabial folds; tongue is midline; uvula is midline; shoulder elevation is normal.  MOTOR: Normal tone, bulk and strength in the arms; no pronator drift. Right hip flexion is 3/5 in dorsiflexion 0. Left hip flexion is 2/5 in dorsiflexion 3.   COORDINATION: Left finger to nose is normal, right finger to nose is normal, No rest tremor; no intention tremor; no postural tremor; no bradykinesia.  REFLEXES: Deep tendon reflexes are symmetrical and normal in the upper extremities. There are quite hyporeflexic in the legs graded as trace even with augmentation. Plantar reflexes are flexor bilaterally.   SENSATION: He responds to painful stimuli in the legs.       Blood pressure (!) 151/74, pulse 70, temperature 97.8 F (36.6 C), temperature source Oral, resp. rate 18, height '6\' 1"'  (1.854 m), weight 179 lb 0.2 oz (81.2 kg), SpO2 99 %.  Past Medical History:  Diagnosis Date  . Anemia   . Arthritis    Hands and left knee  . Coronary artery disease   . Diabetes mellitus without complication ([place])   . High cholesterol   . Hypertension   . MI (myocardial infarction) ([place]) 2009    Past Surgical History:  Procedure Laterality Date  . APPENDECTOMY    . CARDIAC CATHETERIZATION    . COLONOSCOPY    . COLONOSCOPY N/A 04/01/2017   Procedure: COLONOSCOPY;  Surgeon:  Rourk, Cristopher Estimable, MD;  Location: [facility];  Service: Endoscopy;  Laterality: N/A;  9:45 AM  . CORONARY STENT PLACEMENT  2009   x3  . CYSTOSCOPY N/A 05/16/2017   Procedure: CYSTOSCOPY;  Surgeon: Cleon Gustin, MD;  Location: [facility];  Service: Urology;  Laterality: N/A;  . CYSTOSCOPY W/ URETERAL STENT PLACEMENT Bilateral 04/11/2017   Procedure: CYSTOSCOPY WITH RETROGRADE PYELOGRAM;  Surgeon: Cleon Gustin, MD;   Location: [facility];  Service: Urology;  Laterality: Bilateral;  . FRACTURE SURGERY Right    Steel Rod in femur  . INGUINAL HERNIA REPAIR Left 08/15/2017   Procedure: HERNIA REPAIR INGUINAL ADULT WITH MESH;  Surgeon: Aviva Signs, MD;  Location: [facility];  Service: General;  Laterality: Left;  . KNEE ARTHROSCOPY Left   . TONSILLECTOMY    . TRANSURETHRAL RESECTION OF BLADDER TUMOR N/A 04/11/2017   Procedure: TRANSURETHRAL RESECTION OF BLADDER TUMOR (TURBT);  Surgeon: Cleon Gustin, MD;  Location: [facility];  Service: Urology;  Laterality: N/A;  1 [id number]  . TRANSURETHRAL RESECTION OF BLADDER TUMOR N/A 05/16/2017   Procedure: TRANSURETHRAL RESECTION OF BLADDER TUMOR (TURBT);  Surgeon: Cleon Gustin, MD;  Location: [facility];  Service: Urology;  Laterality: N/A;    Family History  Problem Relation Age of Onset  . Diabetes Mother   . Heart attack Mother   . Cancer Father        lung  . Stroke Paternal Grandfather   . Colon cancer Paternal Uncle     Social History:  reports that  has never smoked. he has never used smokeless tobacco. He reports that he does not drink alcohol or use drugs.  Allergies: No Known Allergies  Medications: Prior to Admission medications   Medication Sig Start Date End Date Taking? Authorizing Provider  aspirin 325 MG tablet Take 325 mg by mouth at bedtime.    Yes [provider]  celecoxib (CELEBREX) 200 MG capsule Take 200 mg by mouth daily as needed for mild pain.    Yes [provider]  Choline Fenofibrate (FENOFIBRIC ACID) 135 MG CPDR Take 135 mg by mouth at bedtime.    Yes [provider]  CINNAMON PO Take 1,000 mg by mouth at bedtime.    Yes [provider]  clopidogrel (PLAVIX) 75 MG tablet TAKE 1 TABLET DAILY Patient taking differently: TAKE 1 TABLET AT BEDTIME 02/23/13  Yes Lorretta Harp, MD  Coenzyme Q10 (COQ10) 100 MG CAPS Take 100 mg by mouth at bedtime.   Yes [provider]  ezetimibe (ZETIA) 10 MG tablet Take 10 mg by mouth at bedtime.    Yes [provider]  ibuprofen (ADVIL,MOTRIN) 200 MG tablet Take 400 mg every 8 (eight) hours as needed by mouth for mild pain.    Yes [provider]  insulin lispro protamine-lispro (HUMALOG 75/25 MIX) (75-25) 100 UNIT/ML SUSP injection Inject 30-35 Units into the skin 2 (two) times daily with a meal. Patient takes 35 units in the morning and 30 units at night   Yes [provider]  metFORMIN (GLUCOPHAGE) 500 MG tablet Take 1,000 mg by mouth 2 (two) times daily with a meal. 03/09/17  Yes [provider]  metoprolol succinate (TOPROL-XL) 25 MG 24 hr tablet Take 25 mg by mouth at bedtime. 02/08/17  Yes [provider]  Multiple Vitamin (MULTIVITAMIN) tablet Take 1 tablet by mouth daily.   Yes [provider]  naproxen sodium (ANAPROX) 220 MG tablet Take 440  mg by mouth daily as needed (pain).   Yes [provider]  nitroGLYCERIN (NITROSTAT) 0.4 MG SL tablet Place 0.4 mg every 5 (five) minutes as needed under the tongue for chest pain.   Yes [provider]  Omega-3 Fatty Acids (FISH OIL) 1200 MG CAPS Take 1,200 mg by mouth at bedtime.    Yes [provider]  rosuvastatin (CRESTOR) 40 MG tablet Take 40 mg by mouth at bedtime.    Yes [provider]  sulfamethoxazole-trimethoprim (BACTRIM DS,SEPTRA DS) 800-160 MG tablet Take 1 tablet by mouth 2 (two) times daily.  11/02/17  Yes [provider]  tamsulosin (FLOMAX) 0.4 MG CAPS capsule Take 0.4 mg by mouth at bedtime.   Yes [provider]  telmisartan (MICARDIS) 80 MG tablet Take 80 mg by mouth at bedtime.    Yes [provider]  traMADol (ULTRAM) 50 MG tablet Take 50 mg by mouth every 8 (eight) hours as needed for moderate pain.  11/07/17  Yes [provider]  traZODone (DESYREL) 100 MG tablet Take 50-100 mg by mouth at bedtime as needed.  11/07/17  Yes  [provider]  VICTOZA 18 MG/3ML SOPN Inject 1.8 mg into the skin daily. 03/03/17  Yes [provider]    Scheduled Meds: . aspirin  325 mg Oral QHS  . clopidogrel  75 mg Oral QHS  . enoxaparin (LOVENOX) injection  40 mg Subcutaneous Q24H  . ezetimibe  10 mg Oral QHS  . feeding supplement (GLUCERNA SHAKE)  237 mL Oral TID BM  . insulin aspart  0-20 Units Subcutaneous TID WC  . insulin aspart protamine- aspart  30 Units Subcutaneous BID WC  . irbesartan  300 mg Oral Daily  . metoprolol succinate  25 mg Oral QHS  . rosuvastatin  40 mg Oral QHS  . tamsulosin  0.4 mg Oral QHS   Continuous Infusions: . sodium chloride 100 mL/hr at 11/14/17 0842   PRN Meds:.ketorolac, ondansetron **OR** ondansetron (ZOFRAN) IV, oxyCODONE-acetaminophen, traZODone     Results for orders placed or performed during the hospital encounter of 11/02/2017 (from the past 48 hour(s))  Glucose, capillary     Status: None   Collection Time: 11/12/17 10:06 PM  Result Value Ref Range   Glucose-Capillary 70 65 - 99 mg/dL   Comment 1 Notify RN    Comment 2 Document in Chart   Basic metabolic panel     Status: Abnormal   Collection Time: 11/13/17  6:48 AM  Result Value Ref Range   Sodium 132 (L) 135 - 145 mmol/L   Potassium 4.2 3.5 - 5.1 mmol/L   Chloride 97 (L) 101 - 111 mmol/L   CO2 25 22 - 32 mmol/L   Glucose, Bld 155 (H) 65 - 99 mg/dL   BUN 17 6 - 20 mg/dL   Creatinine, Ser 0.79 0.61 - 1.24 mg/dL   Calcium 8.8 (L) 8.9 - 10.3 mg/dL   GFR calc non Af Amer >60 >60 mL/min   GFR calc Af Amer >60 >60 mL/min    Comment: (NOTE) The eGFR has been calculated using the CKD EPI equation. This calculation has not been validated in all clinical situations. eGFR's persistently <60 mL/min signify possible Chronic Kidney Disease.    Anion gap 10 5 - 15    Comment: Performed at [facility], [address], [place], [place] [address]  CBC     Status: Abnormal   Collection Time: 11/13/17  6:48 AM    Result Value  Ref Range   WBC 11.0 (H) 4.0 - 10.5 K/uL   RBC 4.53 4.22 - 5.81 MIL/uL   Hemoglobin 12.7 (L) 13.0 - 17.0 g/dL   HCT 38.8 (L) 39.0 - 52.0 %   MCV 85.7 78.0 - 100.0 fL   MCH 28.0 26.0 - 34.0 pg   MCHC 32.7 30.0 - 36.0 g/dL   RDW 13.3 11.5 - 15.5 %   Platelets 292 150 - 400 K/uL    Comment: Performed at [facility], [address], [place], [place] [address]  Glucose, capillary     Status: Abnormal   Collection Time: 11/13/17  7:43 AM  Result Value Ref Range   Glucose-Capillary 171 (H) 65 - 99 mg/dL  Glucose, capillary     Status: Abnormal   Collection Time: 11/13/17 11:37 AM  Result Value Ref Range   Glucose-Capillary 229 (H) 65 - 99 mg/dL  Glucose, capillary     Status: Abnormal   Collection Time: 11/13/17  4:46 PM  Result Value Ref Range   Glucose-Capillary 166 (H) 65 - 99 mg/dL  Glucose, capillary     Status: Abnormal   Collection Time: 11/13/17  9:17 PM  Result Value Ref Range   Glucose-Capillary 130 (H) 65 - 99 mg/dL  CBC     Status: Abnormal   Collection Time: 11/14/17  6:54 AM  Result Value Ref Range   WBC 11.5 (H) 4.0 - 10.5 K/uL   RBC 4.59 4.22 - 5.81 MIL/uL   Hemoglobin 13.1 13.0 - 17.0 g/dL   HCT 39.3 39.0 - 52.0 %   MCV 85.6 78.0 - 100.0 fL   MCH 28.5 26.0 - 34.0 pg   MCHC 33.3 30.0 - 36.0 g/dL   RDW 13.4 11.5 - 15.5 %   Platelets 289 150 - 400 K/uL    Comment: Performed at [facility], [address] [place], [place] [address]  Basic metabolic panel     Status: Abnormal   Collection Time: 11/14/17  6:54 AM  Result Value Ref Range   Sodium 135 135 - 145 mmol/L   Potassium 4.7 3.5 - 5.1 mmol/L   Chloride 98 (L) 101 - 111 mmol/L   CO2 25 22 - 32 mmol/L   Glucose, Bld 149 (H) 65 - 99 mg/dL   BUN 17 6 - 20 mg/dL   Creatinine, Ser 0.73 0.61 - 1.24 mg/dL   Calcium 9.3 8.9 - 10.3 mg/dL   GFR calc non Af Amer >60 >60 mL/min   GFR calc Af Amer >60 >60 mL/min    Comment: (NOTE) The eGFR has been calculated using the CKD EPI equation. This  calculation has not been validated in all clinical situations. eGFR's persistently <60 mL/min signify possible Chronic Kidney Disease.    Anion gap 12 5 - 15    Comment: Performed at [facility], [address], [place], [place] [address]  Glucose, capillary     Status: Abnormal   Collection Time: 11/14/17  8:03 AM  Result Value Ref Range   Glucose-Capillary 153 (H) 65 - 99 mg/dL  Glucose, capillary     Status: Abnormal   Collection Time: 11/14/17 12:30 PM  Result Value Ref Range   Glucose-Capillary 201 (H) 65 - 99 mg/dL   Comment 1 Document in Chart   Glucose, capillary     Status: Abnormal   Collection Time: 11/14/17  5:04 PM  Result Value Ref Range   Glucose-Capillary 145 (H) 65 - 99 mg/dL    Studies/Results:  L SPINE  MRI FINDINGS: Segmentation: 5 non rib-bearing lumbar type vertebral bodies are present.  Alignment:  AP alignment is anatomic.  Vertebrae:  Marrow signal and vertebral body heights are normal.  Conus medullaris and cauda equina: Conus extends to the L1-2 level. Conus and cauda equina appear normal.  Paraspinal and other soft tissues: Limited imaging of the abdomen is unremarkable. There is no significant adenopathy.  Disc levels:  L1-2: Negative.  L2-3: Negative.  L3-4: Mild facet hypertrophy is present bilaterally. No significant disc protrusion or stenosis is present.  L4-5: A right paramedian disc protrusion and annular tear is present. There is potential impact on the traversing right L5 nerve roots. The foramina are patent.  L5-S1: A shallow central disc protrusion and annular tear is present without focal stenosis. The foramina are patent.  IMPRESSION: 1. Right paramedian disc protrusion and annular tear at L4-5 with potential impact on the traversing right L5 nerve roots. 2. Shallow central disc protrusion and annular tear at L5-S1 without focal stenosis.   Lumbar spine MRI is reviewed in person.  There is small disc  protrusion at L4-L5 and L5-S1.  The protrusion at each level does not appear to cause significant compromise.     ADB PELVIC CT FINDINGS: Lower chest: Lung bases clear  Hepatobiliary: Gallbladder and liver normal appearance  Pancreas: Normal appearance  Spleen: Normal appearance  Adrenals/Urinary Tract: Minimal chronic thickening of LEFT adrenal gland without discrete mass. Kidneys and ureters normal appearance. Foley catheter decompresses urinary bladder. Bladder mass identified on the previous exam not visualized.  Stomach/Bowel: Appendix surgically absent by history. Stomach and bowel loops normal appearance  Vascular/Lymphatic: Atherosclerotic calcifications aorta and coronary arteries without aortic aneurysm. Few normal sized to mildly enlarged lymph nodes at the LEFT inguinal region.  Reproductive: Mild prostatic enlargement gland measuring 5.9 x 4.1 x 3.2 cm. Seminal vesicles unremarkable.  Other: Infiltrative changes at LEFT inguinal region, by history inguinal herniorrhaphy 08/15/2017. Small focal fluid collection at LEFT inguinal hernia repair 21 x 9 mm image 83 question seroma or lymphocele, infection considered less likely this far removed from surgery though not completely excluded. No free intraperitoneal air or fluid.  Musculoskeletal: Unremarkable  IMPRESSION: Prostatic enlargement.  No acute intra-abdominal or intrapelvic abnormalities.  Postoperative changes of LEFT inguinal herniorrhaphy with reactive lymph nodes and a 21 x 9 mm fluid collection at the surgical bed favor seroma versus lymphocele as discussed above.      Laveyah Oriol A. Merlene Laughter, M.D.  Diplomate, Tax adviser of Psychiatry and Neurology ( Neurology). 11/14/2017, 7:22 PM

## 2017-11-14 NOTE — Progress Notes (Signed)
PROGRESS NOTE    Raymond [name]  XBD:[id number] DOB: August 04, 1956 DOA: 11/07/2017 PCP: Caren Macadam, MD   Brief Narrative:   This is a 62 year old male with recent TURP secondary to bladder tumor who presented to the hospital with generalized weakness, pelvic pain and numbness for past 3 weeks.  He has had significant urinary retention with recent Foley catheter placement.  He was noted to have significant generalized weakness with serum sodium of 122 for which he was placed on IV normal saline with improvement into the normal range.  In speaking with his primary care provider Dr. Mannie Stabile, there was concern about some possible cauda equina and MRI of the lumbar spine was performed demonstrating some lower lumbar findings, but no cauda equina at the present. He continues to have weakness and is working with PT. He continues to have ongoing pain as well as significant weakness that cannot be attributed to hyponatremia at this point.  Family at the bedside is very concerned about the fact that he has not made any progress.  Assessment & Plan:   Active Problems:   Hyponatremia   Protein-calorie malnutrition, severe   1. Generalized progressive weakness.  This was initially thought to be related to hyponatremia, but continues to persist despite adequate treatment.  Certainly, there could be an aspect of deconditioning involved, but I strongly feel that there may be some other neurological component that I may be missing.  Lumbar MRI did not demonstrate any acute findings.  [facility] consultation for further evaluation. Continue IV NS for now due to FB of -2L yesterday and overall poor oral intake. 2. Pelvic floor pain-ongoing.  Continue Percocet every 6 hours as needed along with toradol IV that has been added. This is currently stable. 3. Urinary retention secondary to BPH.  Continue Foley catheter. 4. Constipation. Resolved with enema on 2/16. 5. Status post transurethral resection of  bladder tumor.  Followed by urology as an outpatient. 6. Diabetes.  Continue on insulin as prescribed with good control noted.   DVT prophylaxis:Lovenox Code Status: Full code Family Communication: Wife at bedside Disposition Plan: Continue IV NS for hyponatremia; PT evaluation; Neurology consulted 2/18 for further evaluation    Consultants:   Neurology Dr. Merlene Laughter  Procedures:   None  Antimicrobials:   None   Subjective: Patient seen and evaluated today with no new acute complaints or concerns. No acute concerns or events noted overnight.  He continues to have some pain that is better controlled with Toradol, but is still there.  Family members at bedside are quite concerned about persistent weakness with no significant findings noted on imaging thus far.  Hyponatremia has also been corrected.  Extensive discussion was had and it seems that a Neurology consultation is appropriate.  Objective: Vitals:   11/13/17 0533 11/13/17 1300 11/13/17 2053 11/14/17 0503  BP: 130/81 135/72 (!) 152/78 (!) 142/73  Pulse: 74 79 73 79  Resp: 18 19 18 18   Temp: 98.6 F (37 C) 98.7 F (37.1 C) 99.2 F (37.3 C) 98.8 F (37.1 C)  TempSrc: Oral Oral Oral Oral  SpO2: 98% 97% 99% 99%  Weight:      Height:        Intake/Output Summary (Last 24 hours) at 11/14/2017 1208 Last data filed at 11/14/2017 0504 Gross per 24 hour  Intake 360 ml  Output 2900 ml  Net -2540 ml   Filed Weights   11/08/2017 2300  Weight: 81.2 kg (179 lb 0.2 oz)    Examination:  General exam: Appears calm and comfortable; resting Respiratory system: Clear to auscultation. Respiratory effort normal. Cardiovascular system: S1 & S2 heard, RRR. No JVD, murmurs, rubs, gallops or clicks. No pedal edema. Gastrointestinal system: Abdomen is nondistended, soft and nontender. No organomegaly or masses felt. Normal bowel sounds heard. Central nervous system: Alert and oriented. No focal neurological deficits. Extremities:  Symmetric 5 x 5 power. Skin: No rashes, lesions or ulcers; foley with clear, yellow UO noted    Data Reviewed: I have personally reviewed following labs and imaging studies  CBC: Recent Labs  Lab 11/23/2017 1112 11/11/17 0449 11/12/17 0659 11/13/17 0648 11/14/17 0654  WBC 12.9* 10.0 10.7* 11.0* 11.5*  NEUTROABS 11.2*  --   --   --   --   HGB 13.7 13.3 13.1 12.7* 13.1  HCT 39.9 38.7* 39.4 38.8* 39.3  MCV 83.6 83.6 85.3 85.7 85.6  PLT 326 311 311 292 500   Basic Metabolic Panel: Recent Labs  Lab 11/11/17 0449 11/11/17 1332 11/12/17 0659 11/13/17 0648 11/14/17 0654  NA 127* 129* 132* 132* 135  K 4.2 4.2 4.0 4.2 4.7  CL 90* 92* 95* 97* 98*  CO2 23 26 25 25 25   GLUCOSE 252* 299* 117* 155* 149*  BUN 18 22* 20 17 17   CREATININE 0.89 0.91 0.79 0.79 0.73  CALCIUM 8.8* 9.3 9.2 8.8* 9.3   GFR: Estimated Creatinine Clearance: 106.8 mL/min (by C-G formula based on SCr of 0.73 mg/dL). Liver Function Tests: Recent Labs  Lab 11/14/2017 1112 11/11/17 0449  AST 28 23  ALT 32 27  ALKPHOS 56 57  BILITOT 0.8 1.0  PROT 7.4 6.6  ALBUMIN 3.9 3.5   Recent Labs  Lab 11/04/2017 1112  LIPASE 44   No results for input(s): AMMONIA in the last 168 hours. Coagulation Profile: No results for input(s): INR, PROTIME in the last 168 hours. Cardiac Enzymes: No results for input(s): CKTOTAL, CKMB, CKMBINDEX, TROPONINI in the last 168 hours. BNP (last 3 results) No results for input(s): PROBNP in the last 8760 hours. HbA1C: Recent Labs    11/12/17 0659  HGBA1C 9.1*   CBG: Recent Labs  Lab 11/13/17 0743 11/13/17 1137 11/13/17 1646 11/13/17 2117 11/14/17 0803  GLUCAP 171* 229* 166* 130* 153*   Lipid Profile: No results for input(s): CHOL, HDL, LDLCALC, TRIG, CHOLHDL, LDLDIRECT in the last 72 hours. Thyroid Function Tests: No results for input(s): TSH, T4TOTAL, FREET4, T3FREE, THYROIDAB in the last 72 hours. Anemia Panel: No results for input(s): VITAMINB12, FOLATE, FERRITIN,  TIBC, IRON, RETICCTPCT in the last 72 hours. Sepsis Labs: No results for input(s): PROCALCITON, LATICACIDVEN in the last 168 hours.  No results found for this or any previous visit (from the past 240 hour(s)).     Radiology Studies: No results found.    Scheduled Meds: . aspirin  325 mg Oral QHS  . clopidogrel  75 mg Oral QHS  . enoxaparin (LOVENOX) injection  40 mg Subcutaneous Q24H  . ezetimibe  10 mg Oral QHS  . feeding supplement (GLUCERNA SHAKE)  237 mL Oral TID BM  . insulin aspart  0-20 Units Subcutaneous TID WC  . insulin aspart protamine- aspart  30 Units Subcutaneous BID WC  . irbesartan  300 mg Oral Daily  . metoprolol succinate  25 mg Oral QHS  . rosuvastatin  40 mg Oral QHS  . tamsulosin  0.4 mg Oral QHS   Continuous Infusions: . sodium chloride 100 mL/hr at 11/14/17 0842     LOS: 1 day  Time spent: 30 minutes    Raymond [name] Darleen Crocker, DO [facility] Pager [phone]  If 7PM-7AM, please contact night-coverage [email or web address] Password [facility] 11/14/2017, 12:08 PM

## 2017-11-15 ENCOUNTER — Inpatient Hospital Stay (HOSPITAL_COMMUNITY): Payer: Federal, State, Local not specified - PPO

## 2017-11-15 DIAGNOSIS — G610 Guillain-Barre syndrome: Secondary | ICD-10-CM

## 2017-11-15 LAB — GLUCOSE, CAPILLARY
Glucose-Capillary: 118 mg/dL — ABNORMAL HIGH (ref 65–99)
Glucose-Capillary: 190 mg/dL — ABNORMAL HIGH (ref 65–99)
Glucose-Capillary: 133 mg/dL — ABNORMAL HIGH (ref 65–99)
Glucose-Capillary: 97 mg/dL (ref 65–99)
Glucose-Capillary: 158 mg/dL — ABNORMAL HIGH (ref 65–99)

## 2017-11-15 LAB — BASIC METABOLIC PANEL
Anion gap: 9 (ref 5–15)
BUN: 19 mg/dL (ref 6–20)
CHLORIDE: 99 mmol/L — AB (ref 101–111)
CO2: 26 mmol/L (ref 22–32)
Calcium: 9.1 mg/dL (ref 8.9–10.3)
Creatinine, Ser: 0.82 mg/dL (ref 0.61–1.24)
GFR calc Af Amer: >60 mL/min (ref >60)
GFR calc non Af Amer: >60 mL/min (ref >60)
Glucose, Bld: 143 mg/dL — ABNORMAL HIGH (ref 65–99)
POTASSIUM: 4.7 mmol/L (ref 3.5–5.1)
SODIUM: 134 mmol/L — AB (ref 135–145)

## 2017-11-15 LAB — C-REACTIVE PROTEIN: CRP: 2.2 mg/dL — ABNORMAL HIGH (ref <1.0)

## 2017-11-15 LAB — VITAMIN B12: VITAMIN B 12: 902 pg/mL (ref 180–914)

## 2017-11-15 MED ORDER — METOPROLOL TARTRATE 5 MG/5ML IV SOLN
2.5000 mg | Freq: Once | INTRAVENOUS | Status: AC
  Administered 2017-11-15: 2.5 mg via INTRAVENOUS
  Filled 2017-11-15: qty 5

## 2017-11-15 MED ORDER — IMMUNE GLOBULIN (HUMAN) 10 GM/100ML IV SOLN
400.0000 mg/kg | INTRAVENOUS | Status: DC
  Administered 2017-11-16: 30 g via INTRAVENOUS
  Filled 2017-11-15: qty 200

## 2017-11-15 MED ORDER — KETOROLAC TROMETHAMINE 30 MG/ML IJ SOLN
30.0000 mg | Freq: Four times a day (QID) | INTRAMUSCULAR | Status: AC
  Administered 2017-11-15 – 2017-11-18 (×12): 30 mg via INTRAVENOUS
  Filled 2017-11-15 (×13): qty 1

## 2017-11-15 MED ORDER — METOPROLOL SUCCINATE ER 25 MG PO TB24
25.0000 mg | ORAL_TABLET | Freq: Every day | ORAL | Status: DC
  Administered 2017-11-16 – 2017-11-19 (×4): 25 mg via ORAL
  Filled 2017-11-15 (×4): qty 1

## 2017-11-15 MED ORDER — ENSURE ENLIVE PO LIQD
237.0000 mL | Freq: Two times a day (BID) | ORAL | Status: DC
  Administered 2017-11-15 – 2017-11-19 (×6): 237 mL via ORAL

## 2017-11-15 NOTE — Care Management Note (Signed)
Case Management Note  Patient Details  Name: Raymond [name] MRN: [id number] Date of Birth: 19-Dec-1955  If discussed at Long Length of Stay Meetings, dates discussed:   11/15/2017  Additional Comments:  Sherald Barge, RN 11/15/2017, 12:17 PM

## 2017-11-15 NOTE — Evaluation (Signed)
Clinical/Bedside Swallow Evaluation Patient Details  Name: Raymond [name] MRN: [id number] Date of Birth: [birth year]  Today's Date: 11/15/2017 Time: SLP Start Time (ACUTE ONLY): 0938 SLP Stop Time (ACUTE ONLY): 1657 SLP Time Calculation (min) (ACUTE ONLY): 33 min  Past Medical History:  Past Medical History:  Diagnosis Date  . Anemia   . Arthritis    Hands and left knee  . Coronary artery disease   . Diabetes mellitus without complication ([place])   . High cholesterol   . Hypertension   . MI (myocardial infarction) ([place]) 2009   Past Surgical History:  Past Surgical History:  Procedure Laterality Date  . APPENDECTOMY    . CARDIAC CATHETERIZATION    . COLONOSCOPY    . COLONOSCOPY N/A 04/01/2017   Procedure: COLONOSCOPY;  Surgeon: Raymond Dolin, MD;  Location: [facility];  Service: Endoscopy;  Laterality: N/A;  9:45 AM  . CORONARY STENT PLACEMENT  2009   x3  . CYSTOSCOPY N/A 05/16/2017   Procedure: CYSTOSCOPY;  Surgeon: Raymond Gustin, MD;  Location: [facility];  Service: Urology;  Laterality: N/A;  . CYSTOSCOPY W/ URETERAL STENT PLACEMENT Bilateral 04/11/2017   Procedure: CYSTOSCOPY WITH RETROGRADE PYELOGRAM;  Surgeon: Raymond Gustin, MD;  Location: [facility];  Service: Urology;  Laterality: Bilateral;  . FRACTURE SURGERY Right    Steel Rod in femur  . INGUINAL HERNIA REPAIR Left 08/15/2017   Procedure: HERNIA REPAIR INGUINAL ADULT WITH MESH;  Surgeon: Raymond Signs, MD;  Location: [facility];  Service: General;  Laterality: Left;  . KNEE ARTHROSCOPY Left   . TONSILLECTOMY    . TRANSURETHRAL RESECTION OF BLADDER TUMOR N/A 04/11/2017   Procedure: TRANSURETHRAL RESECTION OF BLADDER TUMOR (TURBT);  Surgeon: Raymond Gustin, MD;  Location: [facility];  Service: Urology;  Laterality: N/A;  1 [id number] [id number]  . TRANSURETHRAL RESECTION OF BLADDER TUMOR N/A 05/16/2017   Procedure: TRANSURETHRAL RESECTION OF BLADDER TUMOR (TURBT);  Surgeon: Raymond Gustin, MD;   Location: [facility];  Service: Urology;  Laterality: N/A;   HPI:  GaryHandyis a61 y.o.male,with history of diabetes mellitus, CAD, status post transurethral resection of bladder tumor came to hospital with complaints of generalized weakness,pelvic pain and numbness for past three weeks. As per patient,he was seen by urologist yesterday and Foley catheter was inserted for urinary retention. Status post left hernia repair with mesh on 08/11/17 by Dr. Aviva [name]. He denies nausea vomiting or diarrhea. Denies abdominal pain. No chest pain or shortness of breath. Also complains of insomniafor past 3 to 4 weeks due to pain. Was prescribed trazodone withonly minimal relief. In the ED CT scan of the abdomen was done which showed only prostate enlargement lab work showed sodium 122. Reports loss of 35 lbs x3 months due to decreased appetite and abd pain/distention.    Assessment / Plan / Recommendation Clinical Impression  Patient was evaluated seated upright in bed; Pt was in a state of somnolence but after being seated upright he easily roused and did accurately report his name and DOB however, he incorrectly reported that he had no children and gave the incorrect city of residence. Note according to nurse, this is a decline in cognitive function from yesterday. Pt consumed ~5 sips via straw and 2 tsp bites of puree and 2 bites of regular. Pt initially consumed all textures and consistencies with no overt s/sx of aspiration; Pt demonstrated a brief dry coughing after the last sip of thin liquids immediately followed by an episode of emesis into  a towel of what appeared to be dark in color followed by clear liquids (assume water from the trials). Pt refused further trials. Pt's sister, present for the evaluation reported prolonged mastication with breads and regular textures. Recommend downgrade to D2/fine chop and continue with thin liquids. Pt is at risk for aspiration secondary altered mentation resulting  in poor oral coordination of bolus. Recommend strategies: pt seated upright at 90 degrees, slow rate, alternating bites and sips and 1:1 feeder for all meals. ST will continue to follow while in acute setting. Recommend consider speech language cognitive evaluation (SLE) d/t cognitive acute change from baseline. SLP Visit Diagnosis: Dysphagia, oropharyngeal phase (R13.12)    Aspiration Risk  Mild aspiration risk;Moderate aspiration risk    Diet Recommendation Dysphagia 2 (Fine chop);Thin liquid   Liquid Administration via: Straw Medication Administration: Whole meds with puree Supervision: Full supervision/cueing for compensatory strategies Compensations: Minimize environmental distractions;Small sips/bites;Multiple dry swallows after each bite/sip Postural Changes: Seated upright at 90 degrees;Remain upright for at least 30 minutes after po intake    Other  Recommendations Oral Care Recommendations: Oral care BID   Follow up Recommendations 24 hour supervision/assistance      Frequency and Duration min 1 x/week  1 week       Prognosis Prognosis for Safe Diet Advancement: Fair Barriers to Reach Goals: Cognitive deficits      Swallow Study   General Date of Onset: 10/30/2017 HPI: Raymond [name] I48 y.o.male,with history of diabetes mellitus, CAD, status post transurethral resection of bladder tumor came to hospital with complaints of generalized weakness,pelvic pain and numbness for past three weeks. As per patient,he was seen by urologist yesterday and Foley catheter was inserted for urinary retention. Status post left hernia repair with mesh on 08/11/17 by Dr. Aviva [name]. He denies nausea vomiting or diarrhea. Denies abdominal pain. No chest pain or shortness of breath. Also complains of insomniafor past 3 to 4 weeks due to pain. Was prescribed trazodone withonly minimal relief. In the ED CT scan of the abdomen was done which showed only prostate enlargement lab work showed  sodium 122. Reports loss of 35 lbs x3 months due to decreased appetite and abd pain/distention.  Type of Study: Bedside Swallow Evaluation Previous Swallow Assessment: none Diet Prior to this Study: Regular;Thin liquids Temperature Spikes Noted: No Respiratory Status: Room air History of Recent Intubation: No Behavior/Cognition: Cooperative;Confused Oral Cavity Assessment: Dry Oral Care Completed by SLP: Yes Oral Cavity - Dentition: Adequate natural dentition Vision: Functional for self-feeding Self-Feeding Abilities: Total assist Patient Positioning: Upright in bed Baseline Vocal Quality: Normal Volitional Cough: Strong Volitional Swallow: Able to elicit    Oral/Motor/Sensory Function Overall Oral Motor/Sensory Function: Within functional limits   Ice Chips Ice chips: Within functional limits   Thin Liquid Thin Liquid: Within functional limits    Nectar Thick Nectar Thick Liquid: Not tested   Honey Thick Honey Thick Liquid: Not tested   Puree Puree: Within functional limits   Solid      Korbyn Vanes H. Roddie Mc, CCC-SLP Speech Language Pathologist    Solid: Within functional limits        Wende Bushy 11/15/2017,5:31 PM

## 2017-11-15 NOTE — Progress Notes (Addendum)
Ivalee A. Merlene Laughter, MD     [email or web address]          Raymond [name] is an 62 y.o. male.   ASSESSMENT/PLAN: 1. Subacute leg weakness that is quite severe: The presentation is most consistent with Guillain-Barr syndrome.  Examination has worsened today both motor wise and breathing. The patient will be transferred to the ICU for more intensive monitoring. Extensive MRI failed to show an etiology. The imaging therefore supports the clinical picture that this is most likely Guillain-Barre syndrome.  Unfortunately, because the patient is on Plavix and the Lovenox will not be able to assess the spinal fluid.  Will treat the patient empirically however with immunoglobulins at the usual protocol 400 milligrams/kilogram for five days. There is a possibility that the patient's weakness could be due to myopathy related to statin use. CPK will be obtained. I will go ahead and discontinue the patient's statin. Also discontinue other medications not needed for now including his Plavix and zetia. Antibodies to HMG COA reductase will be obtained to evaluate for the delayed form of myopathy related to statin use.   2. Unexpected and unexplained encephalopathy: Reduce medications as above and minimize psychotropic medications.   Family is at the bedside. They report that he continues to get worse which I agree with. I did question them about his cognition and they actually also concerned that he is not as responsive as he typically is. He is not complaining of significant pain in the upper extremities.     GENERAL: Patient appears about 33 years older than his stated age.  HEENT:  Neck is supple and no evidence of trauma  EXTREMITIES: No edema; significant pain on palpation of the upper extremities.   BACK: Normal  SKIN: Normal by inspection.    MENTAL STATUS: He is awake but has marked bradyphrenia. There is no spontaneous speech. He does follow commands with much  prompting.  CRANIAL NERVES: Pupils are equal, round and reactive to light and accomodation; extra ocular movements are full, there is no significant nystagmus; visual fields are full; upper and lower facial muscles are normal in strength and symmetric, there is no flattening of the nasolabial folds; tongue is midline; uvula is midline; shoulder elevation is normal.  MOTOR: Left deltoid 4/5 and right deltoid 4/5. Right hip flexion is 2/5 in dorsiflexion 0. Left hip flexion is 2/5 in dorsiflexion 3.   COORDINATION: Left finger to nose is normal, right finger to nose is normal, No rest tremor; no intention tremor; no postural tremor; no bradykinesia.  REFLEXES: Deep tendon reflexes are symmetrical and normal in the upper extremities. He is areflexic in the legs.  SENSATION: He responds to painful stimuli in the legs.       Blood pressure (!) 160/79, pulse 74, temperature 98.2 F (36.8 C), temperature source Axillary, resp. rate 18, height '6\' 1"'  (1.854 m), weight 178 lb 9.2 oz (81 kg), SpO2 97 %.  Past Medical History:  Diagnosis Date  . Anemia   . Arthritis    Hands and left knee  . Coronary artery disease   . Diabetes mellitus without complication ([facility])   . High cholesterol   . Hypertension   . MI (myocardial infarction) ([place]) 2009    Past Surgical History:  Procedure Laterality Date  . APPENDECTOMY    . CARDIAC CATHETERIZATION    . COLONOSCOPY    . COLONOSCOPY N/A 04/01/2017   Procedure: COLONOSCOPY;  Surgeon: Daneil Dolin, MD;  Location: [facility];  Service: Endoscopy;  Laterality: N/A;  9:45 AM  . CORONARY STENT PLACEMENT  2009   x3  . CYSTOSCOPY N/A 05/16/2017   Procedure: CYSTOSCOPY;  Surgeon: Cleon Gustin, MD;  Location: [facility] ORS;  Service: Urology;  Laterality: N/A;  . CYSTOSCOPY W/ URETERAL STENT PLACEMENT Bilateral 04/11/2017   Procedure: CYSTOSCOPY WITH RETROGRADE PYELOGRAM;  Surgeon: Cleon Gustin, MD;  Location: [facility];  Service: Urology;   Laterality: Bilateral;  . FRACTURE SURGERY Right    Steel Rod in femur  . INGUINAL HERNIA REPAIR Left 08/15/2017   Procedure: HERNIA REPAIR INGUINAL ADULT WITH MESH;  Surgeon: Aviva Signs, MD;  Location: [facility];  Service: General;  Laterality: Left;  . KNEE ARTHROSCOPY Left   . TONSILLECTOMY    . TRANSURETHRAL RESECTION OF BLADDER TUMOR N/A 04/11/2017   Procedure: TRANSURETHRAL RESECTION OF BLADDER TUMOR (TURBT);  Surgeon: Cleon Gustin, MD;  Location: [facility];  Service: Urology;  Laterality: N/A;  1 [id number]  . TRANSURETHRAL RESECTION OF BLADDER TUMOR N/A 05/16/2017   Procedure: TRANSURETHRAL RESECTION OF BLADDER TUMOR (TURBT);  Surgeon: Cleon Gustin, MD;  Location: [facility];  Service: Urology;  Laterality: N/A;    Family History  Problem Relation Age of Onset  . Diabetes Mother   . Heart attack Mother   . Cancer Father        lung  . Stroke Paternal Grandfather   . Colon cancer Paternal Uncle     Social History:  reports that  has never smoked. he has never used smokeless tobacco. He reports that he does not drink alcohol or use drugs.  Allergies: No Known Allergies  Medications: Prior to Admission medications   Medication Sig Start Date End Date Taking? Authorizing Provider  aspirin 325 MG tablet Take 325 mg by mouth at bedtime.    Yes [provider]  celecoxib (CELEBREX) 200 MG capsule Take 200 mg by mouth daily as needed for mild pain.    Yes [provider]  Choline Fenofibrate (FENOFIBRIC ACID) 135 MG CPDR Take 135 mg by mouth at bedtime.    Yes [provider]  CINNAMON PO Take 1,000 mg by mouth at bedtime.    Yes [provider]  clopidogrel (PLAVIX) 75 MG tablet TAKE 1 TABLET DAILY Patient taking differently: TAKE 1 TABLET AT BEDTIME 02/23/13  Yes Lorretta Harp, MD  Coenzyme Q10 (COQ10) 100 MG CAPS Take 100 mg by mouth at bedtime.   Yes [provider]  ezetimibe (ZETIA) 10 MG  tablet Take 10 mg by mouth at bedtime.    Yes [provider]  ibuprofen (ADVIL,MOTRIN) 200 MG tablet Take 400 mg every 8 (eight) hours as needed by mouth for mild pain.    Yes [provider]  insulin lispro protamine-lispro (HUMALOG 75/25 MIX) (75-25) 100 UNIT/ML SUSP injection Inject 30-35 Units into the skin 2 (two) times daily with a meal. Patient takes 35 units in the morning and 30 units at night   Yes [provider]  metFORMIN (GLUCOPHAGE) 500 MG tablet Take 1,000 mg by mouth 2 (two) times daily with a meal. 03/09/17  Yes [provider]  metoprolol succinate (TOPROL-XL) 25 MG 24 hr tablet Take 25 mg by mouth at bedtime. 02/08/17  Yes [provider]  Multiple Vitamin (MULTIVITAMIN) tablet Take 1 tablet by mouth daily.   Yes [provider]  naproxen sodium (ANAPROX) 220 MG tablet Take 440 mg by mouth daily as needed (pain).  Yes [provider]  nitroGLYCERIN (NITROSTAT) 0.4 MG SL tablet Place 0.4 mg every 5 (five) minutes as needed under the tongue for chest pain.   Yes [provider]  Omega-3 Fatty Acids (FISH OIL) 1200 MG CAPS Take 1,200 mg by mouth at bedtime.    Yes [provider]  rosuvastatin (CRESTOR) 40 MG tablet Take 40 mg by mouth at bedtime.    Yes [provider]  sulfamethoxazole-trimethoprim (BACTRIM DS,SEPTRA DS) 800-160 MG tablet Take 1 tablet by mouth 2 (two) times daily.  11/02/17  Yes [provider]  tamsulosin (FLOMAX) 0.4 MG CAPS capsule Take 0.4 mg by mouth at bedtime.   Yes [provider]  telmisartan (MICARDIS) 80 MG tablet Take 80 mg by mouth at bedtime.    Yes [provider]  traMADol (ULTRAM) 50 MG tablet Take 50 mg by mouth every 8 (eight) hours as needed for moderate pain.  11/07/17  Yes [provider]  traZODone (DESYREL) 100 MG tablet Take 50-100 mg by mouth at bedtime as needed.  11/07/17  Yes [provider]  VICTOZA 18  MG/3ML SOPN Inject 1.8 mg into the skin daily. 03/03/17  Yes [provider]    Scheduled Meds: . aspirin  325 mg Oral QHS  . clopidogrel  75 mg Oral QHS  . enoxaparin (LOVENOX) injection  40 mg Subcutaneous Q24H  . ezetimibe  10 mg Oral QHS  . feeding supplement (ENSURE ENLIVE)  237 mL Oral BID BM  . insulin aspart  0-20 Units Subcutaneous TID WC  . insulin aspart protamine- aspart  30 Units Subcutaneous BID WC  . irbesartan  300 mg Oral Daily  . ketorolac  30 mg Intravenous Q6H  . metoprolol succinate  25 mg Oral QHS  . rosuvastatin  40 mg Oral QHS  . tamsulosin  0.4 mg Oral QHS   Continuous Infusions: . IMMUNE GLOBULIN 10% (HUMAN) IV - For Fluid Restriction Only     PRN Meds:.ondansetron **OR** ondansetron (ZOFRAN) IV, oxyCODONE-acetaminophen, traZODone     Results for orders placed or performed during the hospital encounter of 10/28/2017 (from the past 48 hour(s))  Glucose, capillary     Status: Abnormal   Collection Time: 11/13/17  9:17 PM  Result Value Ref Range   Glucose-Capillary 130 (H) 65 - 99 mg/dL  CBC     Status: Abnormal   Collection Time: 11/14/17  6:54 AM  Result Value Ref Range   WBC 11.5 (H) 4.0 - 10.5 K/uL   RBC 4.59 4.22 - 5.81 MIL/uL   Hemoglobin 13.1 13.0 - 17.0 g/dL   HCT 39.3 39.0 - 52.0 %   MCV 85.6 78.0 - 100.0 fL   MCH 28.5 26.0 - 34.0 pg   MCHC 33.3 30.0 - 36.0 g/dL   RDW 13.4 11.5 - 15.5 %   Platelets 289 150 - 400 K/uL    Comment: Performed at [facility], [address] [place], [place] [address]  Basic metabolic panel     Status: Abnormal   Collection Time: 11/14/17  6:54 AM  Result Value Ref Range   Sodium 135 135 - 145 mmol/L   Potassium 4.7 3.5 - 5.1 mmol/L   Chloride 98 (L) 101 - 111 mmol/L   CO2 25 22 - 32 mmol/L   Glucose, Bld 149 (H) 65 - 99 mg/dL   BUN 17 6 - 20 mg/dL   Creatinine, Ser 0.73 0.61 - 1.24 mg/dL   Calcium 9.3 8.9 - 10.3 mg/dL  GFR calc non Af Amer >60 >60 mL/min   GFR calc Af Amer >60 >60 mL/min     Comment: (NOTE) The eGFR has been calculated using the CKD EPI equation. This calculation has not been validated in all clinical situations. eGFR's persistently <60 mL/min signify possible Chronic Kidney Disease.    Anion gap 12 5 - 15    Comment: Performed at [facility], [address], [place], [place] [address]  Glucose, capillary     Status: Abnormal   Collection Time: 11/14/17  8:03 AM  Result Value Ref Range   Glucose-Capillary 153 (H) 65 - 99 mg/dL  Glucose, capillary     Status: Abnormal   Collection Time: 11/14/17 12:30 PM  Result Value Ref Range   Glucose-Capillary 201 (H) 65 - 99 mg/dL   Comment 1 Document in Chart   Glucose, capillary     Status: Abnormal   Collection Time: 11/14/17  5:04 PM  Result Value Ref Range   Glucose-Capillary 145 (H) 65 - 99 mg/dL  Glucose, capillary     Status: Abnormal   Collection Time: 11/14/17  9:44 PM  Result Value Ref Range   Glucose-Capillary 118 (H) 65 - 99 mg/dL  Vitamin B12     Status: None   Collection Time: 11/14/17  9:53 PM  Result Value Ref Range   Vitamin B-12 902 180 - 914 pg/mL    Comment: (NOTE) This assay is not validated for testing neonatal or myeloproliferative syndrome specimens for Vitamin B12 levels. Performed at [facility], [place] [address] [place], [place] [address]   C-reactive protein     Status: Abnormal   Collection Time: 11/14/17  9:53 PM  Result Value Ref Range   CRP 2.2 (H) <1.0 mg/dL    Comment: Performed at [facility], [place] [address] [place], [place] [address]  Sedimentation rate     Status: Abnormal   Collection Time: 11/14/17  9:53 PM  Result Value Ref Range   Sed Rate 47 (H) 0 - 16 mm/hr    Comment: Performed at [facility], [address] [place], [place] [address]  Basic metabolic panel     Status: Abnormal   Collection Time: 11/15/17  6:10 AM  Result Value Ref Range   Sodium 134 (L) 135 - 145 mmol/L   Potassium 4.7 3.5 - 5.1 mmol/L   Chloride 99 (L) 101 - 111 mmol/L    CO2 26 22 - 32 mmol/L   Glucose, Bld 143 (H) 65 - 99 mg/dL   BUN 19 6 - 20 mg/dL   Creatinine, Ser 0.82 0.61 - 1.24 mg/dL   Calcium 9.1 8.9 - 10.3 mg/dL   GFR calc non Af Amer >60 >60 mL/min   GFR calc Af Amer >60 >60 mL/min    Comment: (NOTE) The eGFR has been calculated using the CKD EPI equation. This calculation has not been validated in all clinical situations. eGFR's persistently <60 mL/min signify possible Chronic Kidney Disease.    Anion gap 9 5 - 15    Comment: Performed at [facility], [address], [place], [place] [address]  Glucose, capillary     Status: Abnormal   Collection Time: 11/15/17  8:22 AM  Result Value Ref Range   Glucose-Capillary 190 (H) 65 - 99 mg/dL   Comment 1 Notify RN    Comment 2 Document in Chart   Glucose, capillary     Status: Abnormal   Collection Time: 11/15/17 11:45 AM  Result  Value Ref Range   Glucose-Capillary 133 (H) 65 - 99 mg/dL   Comment 1 Notify RN    Comment 2 Document in Chart   Glucose, capillary     Status: None   Collection Time: 11/15/17  4:20 PM  Result Value Ref Range   Glucose-Capillary 97 65 - 99 mg/dL   Comment 1 Notify RN    Comment 2 Document in Chart     Studies/Results:  L SPINE MRI FINDINGS: Segmentation: 5 non rib-bearing lumbar type vertebral bodies are present.  Alignment:  AP alignment is anatomic.  Vertebrae:  Marrow signal and vertebral body heights are normal.  Conus medullaris and cauda equina: Conus extends to the L1-2 level. Conus and cauda equina appear normal.  Paraspinal and other soft tissues: Limited imaging of the abdomen is unremarkable. There is no significant adenopathy.  Disc levels:  L1-2: Negative.  L2-3: Negative.  L3-4: Mild facet hypertrophy is present bilaterally. No significant disc protrusion or stenosis is present.  L4-5: A right paramedian disc protrusion and annular tear is present. There is potential impact on the traversing right L5 nerve roots. The  foramina are patent.  L5-S1: A shallow central disc protrusion and annular tear is present without focal stenosis. The foramina are patent.  IMPRESSION: 1. Right paramedian disc protrusion and annular tear at L4-5 with potential impact on the traversing right L5 nerve roots. 2. Shallow central disc protrusion and annular tear at L5-S1 without focal stenosis.   Lumbar spine MRI is reviewed in person.  There is small disc protrusion at L4-L5 and L5-S1.  The protrusion at each level does not appear to cause significant compromise.     ADB PELVIC CT FINDINGS: Lower chest: Lung bases clear  Hepatobiliary: Gallbladder and liver normal appearance  Pancreas: Normal appearance  Spleen: Normal appearance  Adrenals/Urinary Tract: Minimal chronic thickening of LEFT adrenal gland without discrete mass. Kidneys and ureters normal appearance. Foley catheter decompresses urinary bladder. Bladder mass identified on the previous exam not visualized.  Stomach/Bowel: Appendix surgically absent by history. Stomach and bowel loops normal appearance  Vascular/Lymphatic: Atherosclerotic calcifications aorta and coronary arteries without aortic aneurysm. Few normal sized to mildly enlarged lymph nodes at the LEFT inguinal region.  Reproductive: Mild prostatic enlargement gland measuring 5.9 x 4.1 x 3.2 cm. Seminal vesicles unremarkable.  Other: Infiltrative changes at LEFT inguinal region, by history inguinal herniorrhaphy 08/15/2017. Small focal fluid collection at LEFT inguinal hernia repair 21 x 9 mm image 83 question seroma or lymphocele, infection considered less likely this far removed from surgery though not completely excluded. No free intraperitoneal air or fluid.  Musculoskeletal: Unremarkable  IMPRESSION: Prostatic enlargement.  No acute intra-abdominal or intrapelvic abnormalities.  Postoperative changes of LEFT inguinal herniorrhaphy with reactive lymph  nodes and a 21 x 9 mm fluid collection at the surgical bed favor seroma versus lymphocele as discussed above.     BRAIN MRI MRA  FINDINGS: MRI HEAD FINDINGS  Brain: Diffusion imaging does not show any acute or subacute infarction. The brain shows generalized atrophy, progressive since 2010. The study does not show a pattern of advanced small vessel ischemic change, with only a few small foci within the hemispheric white matter. There is some layering material dependent within the ventricles, probably some old blood products related to the falls. The differential diagnosis would be layering debris secondary to meningitis. No evidence of mass lesion. No extra-axial collection.  Vascular: Major vessels at the base of the brain show flow.  Skull and  upper cervical spine: Negative  Sinuses/Orbits: Clear/normal  Other: None  MRA HEAD FINDINGS  Both internal carotid arteries are patent through the skull base and siphon regions. On the right, this vessel supplies only the middle cerebral artery. No MCA stenosis or occlusion. On the left, this vessel supplies the left middle cerebral artery and both anterior cerebral arteries. No stenosis, occlusion, aneurysm or high flow vascular malformation.  Both vertebral arteries are widely patent to the basilar. No basilar stenosis. Posterior circulation branch vessels appear normal.  IMPRESSION: Generalized brain atrophy, progressive since the CT scan of 2010. There are only minimal small vessel changes of the hemispheric white matter.  Some layering material in the occipital horns of lateral ventricles, probably blood products related to recent falls described clinically. Differential diagnosis does include debris secondary to meningitis, but that seems less likely.  Negative intracranial MR angiography of the large and medium size vessels. 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      MRI  C AND T SPINE  FINDINGS: MRI CERVICAL SPINE FINDINGS  Alignment: Normal  Vertebrae: No fracture or primary bone lesion.  Cord: To cord compression or primary cord lesion.  Posterior Fossa, vertebral arteries, paraspinal tissues: Negative  Disc levels:  No abnormality at the foramen magnum, C1-2 or C2-3.  C3-4: Right-sided predominant spondylosis. Mild to moderate right foraminal narrowing. No compressive central canal stenosis.  C4-5 through C7-T1: Normal.  MRI thoracic SPINE FINDINGS  Alignment:  Normal  Vertebrae: No fracture or primary bone lesion. Ordinary small endplate Schmorl's nodes in the midthoracic region.  Cord: No evidence of primary cord lesion. No evidence of abnormal cord T2 signal. Ample subarachnoid space present dorsal to the cord. See below.  Paraspinal and other soft tissues: Negative  Disc levels:  No abnormality at T6-7 or above.  T7-8: Central disc herniation effaces the ventral subarachnoid space and contacts the ventral cord. No foraminal extension. No cord compression.  T8-9: Small central disc herniation indents the ventral subarachnoid space and contacts the ventral cord. No foraminal extension. No cord compression.  T9-10: Right paracentral disc herniation effaces the ventral subarachnoid space on the right and contacts the right ventral cord. No foraminal extension. No cord compression.  T10-11: Central disc herniation with upward migration of disc material behind the T10 vertebral body. Effacement of the ventral subarachnoid space with indentation of the cord more on the left. Ample subarachnoid space present dorsal to the cord. No foraminal extension.  T11-12: Normal.  T12-L1: Normal.  IMPRESSION: Cervical spine: No acute or traumatic finding. Right-sided spondylosis at C3-4 with mild to moderate right foraminal narrowing.  Thoracic spine: No  acute or traumatic finding. Central disc herniations at T7-8 and T8-9 without likely neural compression. Central to right-sided disc herniation at T9-10 without likely neural compression. These could certainly be associated with back pain. T10-11 central disc herniation with upward migration behind T10 with some indentation of the left side of the cord. Ample subarachnoid space present dorsal to the cord, therefore cord compression is unlikely. This could also be associated with back pain however.      THE IMAGING ARE REVIEWED IN PERSON.  A brain MRI shows a tiny increased signal involving the right posterior aspect of the temporal horn on DWI.  This is not seen on the ADC scan.  Significance is unclear.  No other evidence of acute changes are noted.  There is moderate global atrophy more prominent than expected  for age.  There is minimal white matter changes seen on FLAIR imaging.  No hemorrhages appreciated.  The cervical and the thoracic spine MRI is also reviewed.  The thoracic MRI is surprisingly under well min.  The thoracic MRI shows a large disc herniation posterior  At T10-T11 which encroach is the spinal cord but there is adequate room posteriorly.  There is a smaller disc at T7 T8.        Jodette Wik A. Merlene Laughter, M.D.  Diplomate, Tax adviser of Psychiatry and Neurology ( Neurology). 11/15/2017, 5:59 PM

## 2017-11-15 NOTE — Progress Notes (Signed)
Nutrition Follow-up  DOCUMENTATION CODES:  Severe malnutrition in context of chronic illness  INTERVENTION:  48-Hr calorie count. Reviewed methods with wife/patient. Apprised nursing/dietary.  Briefly Follow up tomorrow afternoon  Discussed with patient/wife that, given patient presentation with severe malnutrition/FTT,  if patient is unable to meet >60% of his needs, a temporary feeding tube (ngt) would be warranted to prevent continued decline  D/C glucerna. Begin Ensure Enlive po BID, each supplement provides 350 kcal and 20 grams of protein  Took food requests/prefs.   NUTRITION DIAGNOSIS:  Severe Malnutrition(Chronic context) related to Abdominal pain (worse w/ intake), weakness, lack of any appetite, as evidenced by an intake that has met </= 75% of needs for >/= 1 month and a loss of >14% bw in <3 months  Ongoing, worsening  GOAL:  Patient will meet greater than or equal to 90% of their needs   Not met  MONITOR:  PO intake, Supplement acceptance, Diet advancement, Labs, Weight trends  REASON FOR ASSESSMENT:  Consult Calorie Count  ASSESSMENT:  62 y/o male PMHx Anemia, CAD, MI, DM2, HTN/HLD w/ recent TURP last July and August for bladder tumor. Presents with generalized pelvic pain & numbness/weakness of BLE x3 weeks. Reports loss of 35 lbs x3 months due to decreased appetite and abd pain/distention. Symptoms concerning for tumor recurrence pressing on spinal cord. In ED worked up for hyponatremia (122) and admitted for management.   Interval hx since seen 2/15. Due to continued weakness/failure to improve w/ correction of sodium, neurology consulted. Per their note, patient presentation most consistent with guillain-barre syndrome. Workup ongoing.   RD has been consulted for calorie count due to continued poor po Intake. Per review of intake documentation, the patient has eaten 25-50% of his meals.   Wife is at bedside today. Patient more to slow to respond than when  last seen. Essentially could not elicit any information whatsoever from the patient.   Wife reports the patient has gotten worse, not better, "all he does is sleep". She notes intake is quite poor and she believes he is eating far less than 25-50%. Yesterday, she says the patient had a few bites for breakfast, a Glucerna, and then ate nothing for Lunch or dinner. She does not believe poor intake is related to the hospital food as she has offered to get him whatever he wants from outside and he wont eat it, he had asked for a fish sandwich and when he received it he ate 5 bites. She admits she tried to "force feed him" and the pt "choked a little". Reviewed risk of aspiration  Wife is confounded by the patients change in intake and refusal of items he typically eats well. She says she cannot be sure of any items the patient will accept anymore.   Received more in depth information regarding eating history PTA. She says over a course of about a month and a half the patient gradually became more weak and slowly lost his appetite. She estimated the patient had been eating ~50% of his normal amount for the past month. She originally thought the wt loss was intentional because he had been instructed to lose weight due to his DM. She says the patient was taking vitamins at home.   She repeats the patient has lost 35 lbs in the past 1.5 months. Bed weight today is 81 kg, indicating further loss.   Given that patient had been eating ~50% of his needs for ~1 month, has lost 30-35 lbs and is  failing to improve nutritionally, RD explained to patient and wife that a NGT w/ initiation of TF would be the most appropriate intervention to prevent continued/ worsening malnutrition. Reviewed nutrition support guidelines: patient should be approximately 60% of needs to avoid this.   Reviewed Calorie Count protocol and how it is conducted. Informed nursing and dietary ambassadors of order.   RD tried several times to  elicit food desires from the patient. The only item that was voiced was ice cream. Will order on trays. F/U tomorrow afternoon to see how he is progressing.   Labs: Na improved to 134, A1C 9.2=~217 avg. Recent BGs mostly 115-160 Meds: Ensure ENlive, insulin, IVF  Recent Labs  Lab 11/13/17 0648 11/14/17 0654 11/15/17 0610  NA 132* 135 134*  K 4.2 4.7 4.7  CL 97* 98* 99*  CO2 '25 25 26  ' BUN '17 17 19  ' CREATININE 0.79 0.73 0.82  CALCIUM 8.8* 9.3 9.1  GLUCOSE 155* 149* 143*   NUTRITION - FOCUSED PHYSICAL EXAM:   Most Recent Value  Orbital Region  Mild depletion  Upper Arm Region  Mild depletion  Thoracic and Lumbar Region  Moderate depletion  Buccal Region  No depletion  Temple Region  Mild depletion  Clavicle Bone Region  Moderate depletion  Clavicle and Acromion Bone Region  Mild depletion  Scapular Bone Region  Unable to assess  Dorsal Hand  Mild depletion  Patellar Region  No depletion  Anterior Thigh Region  Moderate depletion  Posterior Calf Region  Mild depletion  Edema (RD Assessment)  None     Diet Order:  Diet regular Room service appropriate? Yes; Fluid consistency: Thin  EDUCATION NEEDS:  No education needs have been identified at this time  Skin:  Abrasion to R elbow, MSAD to buttocks, groin, scrotum   Last BM:  2/16  Height:  Ht Readings from Last 1 Encounters:  10/28/2017 '6\' 1"'  (1.854 m)   Weight:  Wt Readings from Last 1 Encounters:  11/15/17 178 lb 9.2 oz (81 kg)   Wt Readings from Last 10 Encounters:  11/15/17 178 lb 9.2 oz (81 kg)  11/07/2017 182 lb (82.6 kg)  08/25/17 209 lb (94.8 kg)  08/12/17 210 lb (95.3 kg)  08/09/17 211 lb (95.7 kg)  05/16/17 200 lb (90.7 kg)  05/11/17 200 lb (90.7 kg)  04/15/17 206 lb (93.4 kg)  04/11/17 206 lb (93.4 kg)  04/08/17 206 lb 12.8 oz (93.8 kg)   Ideal Body Weight:  83.64 kg  BMI:  Body mass index is 23.56 kg/m.  Estimated Nutritional Needs:  Kcal:  2200-2450 kcals (27-30 kcal/kg bw) Protein:  97-114 g  Pro (1.2-1.4 g/kg bw) Fluid:  >2.4 L (30 ml/kg bw)  Burtis Junes RD, LDN, CNSC Clinical Nutrition Pager: [phone] 11/15/2017 3:44 PM

## 2017-11-15 NOTE — Progress Notes (Signed)
PROGRESS NOTE    Raymond [name]  YPP:[id number] DOB: Jun 13, 1956 DOA: 11/11/2017 PCP: Caren Macadam, MD   Brief Narrative:   This is a 62 year old male with recent TURP secondary to bladder tumor who presented to the hospital with generalized weakness, pelvic pain and numbness for past 3 weeks.  He has had significant urinary retention with recent Foley catheter placement.  He was noted to have significant generalized weakness with serum sodium of 122, related to poor PO intake, for which he was placed on IV normal saline with improvement into the normal range.  In speaking with his primary care provider Dr. Mannie Stabile, there was concern about some possible cauda equina and MRI of the lumbar spine was performed demonstrating some lower lumbar findings, but no cauda equina at the present. He continues to have weakness and is working with PT. He continues to have ongoing pain as well as significant weakness that cannot be attributed to hyponatremia at this point.  Family at the bedside is very concerned about the fact that he has not made any progress.  Therefore, neurology was consulted with further MRI imaging performed today with no acute abnormalities noted thus far.  Assessment & Plan:   Active Problems:   Hyponatremia   Protein-calorie malnutrition, severe   1. Generalized progressive weakness.  This was initially thought to be related to hyponatremia secondary to poor oral intake, but this has improved with IV fluid administration.  Sodium levels have stayed in the 134-135 range at this time.  Fluid balance is +2 L for which I will discontinue IV fluid currently.  Appreciate ongoing neurology recommendations.  Patient may require spinal tap for further evaluation. Labs per neurology pending. PT evaluation ongoing with likely need for placement. CSW aware. Turn in bed. 2. Dysphagia/anorexia. Speech eval ordered today along with Nutrition consult. Calorie count. May require appetite stimulant.   3. Pelvic floor pain-ongoing.  Continue Percocet every 6 hours as needed along with toradol IV that has been scheduled now as pt will not ask for pain medication. This is currently stable. 4. Urinary retention secondary to BPH.  Continue Foley catheter. 5. Constipation. Resolved with enema on 2/16. 6. Status post transurethral resection of bladder tumor.  Followed by urology as an outpatient. 7. Diabetes.  Continue on insulin as prescribed with good control noted.   DVT prophylaxis:Lovenox Code Status: Full code Family Communication: Sister at bedside today Disposition Plan: Continue ongoing Neurology workup for now along with pain management. Will likely need placement eventually.   Consultants:   Neurology Dr. Merlene Laughter  Procedures:   None  Antimicrobials:   None   Subjective: Patient seen and evaluated today with no new acute complaints or concerns.  He appears much more awake and alert and states that his pain is better controlled.  His sister is at the bedside and states that he has not been eating very much at all.  Objective: Vitals:   11/14/17 1733 11/14/17 2105 11/14/17 2141 11/15/17 0406  BP: (!) 151/74  (!) 156/72 (!) 159/79  Pulse: 70  69 72  Resp: 18  18 18   Temp: 97.8 F (36.6 C)  98.7 F (37.1 C) 98.7 F (37.1 C)  TempSrc: Oral  Oral Oral  SpO2: 99% 94% 99% 98%  Weight:      Height:        Intake/Output Summary (Last 24 hours) at 11/15/2017 1307 Last data filed at 11/15/2017 0446 Gross per 24 hour  Intake 4974.58 ml  Output 3050 ml  Net 1924.58 ml   Filed Weights   11/07/2017 2300  Weight: 81.2 kg (179 lb 0.2 oz)    Examination:  General exam: Appears calm and comfortable; resting Respiratory system: Clear to auscultation. Respiratory effort normal. Cardiovascular system: S1 & S2 heard, RRR. No JVD, murmurs, rubs, gallops or clicks. No pedal edema. Gastrointestinal system: Abdomen is nondistended, soft and nontender. No organomegaly or masses  felt. Normal bowel sounds heard. Central nervous system: Alert and oriented. No focal neurological deficits. Extremities: weakness bilaterally Skin: No rashes, lesions or ulcers; foley with clear, yellow UO noted    Data Reviewed: I have personally reviewed following labs and imaging studies  CBC: Recent Labs  Lab 10/30/2017 1112 11/11/17 0449 11/12/17 0659 11/13/17 0648 11/14/17 0654  WBC 12.9* 10.0 10.7* 11.0* 11.5*  NEUTROABS 11.2*  --   --   --   --   HGB 13.7 13.3 13.1 12.7* 13.1  HCT 39.9 38.7* 39.4 38.8* 39.3  MCV 83.6 83.6 85.3 85.7 85.6  PLT 326 311 311 292 756   Basic Metabolic Panel: Recent Labs  Lab 11/11/17 1332 11/12/17 0659 11/13/17 0648 11/14/17 0654 11/15/17 0610  NA 129* 132* 132* 135 134*  K 4.2 4.0 4.2 4.7 4.7  CL 92* 95* 97* 98* 99*  CO2 26 25 25 25 26   GLUCOSE 299* 117* 155* 149* 143*  BUN 22* 20 17 17 19   CREATININE 0.91 0.79 0.79 0.73 0.82  CALCIUM 9.3 9.2 8.8* 9.3 9.1   GFR: Estimated Creatinine Clearance: 104.2 mL/min (by C-G formula based on SCr of 0.82 mg/dL). Liver Function Tests: Recent Labs  Lab 11/20/2017 1112 11/11/17 0449  AST 28 23  ALT 32 27  ALKPHOS 56 57  BILITOT 0.8 1.0  PROT 7.4 6.6  ALBUMIN 3.9 3.5   Recent Labs  Lab 11/19/2017 1112  LIPASE 44   No results for input(s): AMMONIA in the last 168 hours. Coagulation Profile: No results for input(s): INR, PROTIME in the last 168 hours. Cardiac Enzymes: No results for input(s): CKTOTAL, CKMB, CKMBINDEX, TROPONINI in the last 168 hours. BNP (last 3 results) No results for input(s): PROBNP in the last 8760 hours. HbA1C: No results for input(s): HGBA1C in the last 72 hours. CBG: Recent Labs  Lab 11/14/17 1230 11/14/17 1704 11/14/17 2144 11/15/17 0822 11/15/17 1145  GLUCAP 201* 145* 118* 190* 133*   Lipid Profile: No results for input(s): CHOL, HDL, LDLCALC, TRIG, CHOLHDL, LDLDIRECT in the last 72 hours. Thyroid Function Tests: No results for input(s): TSH,  T4TOTAL, FREET4, T3FREE, THYROIDAB in the last 72 hours. Anemia Panel: No results for input(s): VITAMINB12, FOLATE, FERRITIN, TIBC, IRON, RETICCTPCT in the last 72 hours. Sepsis Labs: No results for input(s): PROCALCITON, LATICACIDVEN in the last 168 hours.  No results found for this or any previous visit (from the past 240 hour(s)).     Radiology Studies: Mr Virgel Paling EP Contrast  Result Date: 11/15/2017 CLINICAL DATA:  Multiple falls recently with back pain. EXAM: MRI HEAD WITHOUT CONTRAST MRA HEAD WITHOUT CONTRAST TECHNIQUE: Multiplanar, multiecho pulse sequences of the brain and surrounding structures were obtained without intravenous contrast. Angiographic images of the head were obtained using MRA technique without contrast. COMPARISON:  Head CT 03/25/2009 FINDINGS: MRI HEAD FINDINGS Brain: Diffusion imaging does not show any acute or subacute infarction. The brain shows generalized atrophy, progressive since 2010. The study does not show a pattern of advanced small vessel ischemic change, with only a few small foci within the hemispheric white matter. There  is some layering material dependent within the ventricles, probably some old blood products related to the falls. The differential diagnosis would be layering debris secondary to meningitis. No evidence of mass lesion. No extra-axial collection. Vascular: Major vessels at the base of the brain show flow. Skull and upper cervical spine: Negative Sinuses/Orbits: Clear/normal Other: None MRA HEAD FINDINGS Both internal carotid arteries are patent through the skull base and siphon regions. On the right, this vessel supplies only the middle cerebral artery. No MCA stenosis or occlusion. On the left, this vessel supplies the left middle cerebral artery and both anterior cerebral arteries. No stenosis, occlusion, aneurysm or high flow vascular malformation. Both vertebral arteries are widely patent to the basilar. No basilar stenosis. Posterior  circulation branch vessels appear normal. IMPRESSION: Generalized brain atrophy, progressive since the CT scan of 2010. There are only minimal small vessel changes of the hemispheric white matter. Some layering material in the occipital horns of lateral ventricles, probably blood products related to recent falls described clinically. Differential diagnosis does include debris secondary to meningitis, but that seems less likely. Negative intracranial MR angiography of the large and medium size vessels. 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 Electronically Signed   By: Nelson Chimes M.D.   On: 11/15/2017 08:32   Mr Brain Wo Contrast  Result Date: 11/15/2017 CLINICAL DATA:  Multiple falls recently with back pain. EXAM: MRI HEAD WITHOUT CONTRAST MRA HEAD WITHOUT CONTRAST TECHNIQUE: Multiplanar, multiecho pulse sequences of the brain and surrounding structures were obtained without intravenous contrast. Angiographic images of the head were obtained using MRA technique without contrast. COMPARISON:  Head CT 03/25/2009 FINDINGS: MRI HEAD FINDINGS Brain: Diffusion imaging does not show any acute or subacute infarction. The brain shows generalized atrophy, progressive since 2010. The study does not show a pattern of advanced small vessel ischemic change, with only a few small foci within the hemispheric white matter. There is some layering material dependent within the ventricles, probably some old blood products related to the falls. The differential diagnosis would be layering debris secondary to meningitis. No evidence of mass lesion. No extra-axial collection. Vascular: Major vessels at the base of the brain show flow. Skull and upper cervical spine: Negative Sinuses/Orbits: Clear/normal Other: None MRA HEAD FINDINGS Both internal carotid arteries are patent through the skull base and siphon regions. On the right, this vessel supplies only the  middle cerebral artery. No MCA stenosis or occlusion. On the left, this vessel supplies the left middle cerebral artery and both anterior cerebral arteries. No stenosis, occlusion, aneurysm or high flow vascular malformation. Both vertebral arteries are widely patent to the basilar. No basilar stenosis. Posterior circulation branch vessels appear normal. IMPRESSION: Generalized brain atrophy, progressive since the CT scan of 2010. There are only minimal small vessel changes of the hemispheric white matter. Some layering material in the occipital horns of lateral ventricles, probably blood products related to recent falls described clinically. Differential diagnosis does include debris secondary to meningitis, but that seems less likely. Negative intracranial MR angiography of the large and medium size vessels. 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 Electronically Signed   By: Nelson Chimes M.D.   On: 11/15/2017 08:32   Mr Cervical Spine Wo Contrast  Result Date: 11/15/2017 CLINICAL DATA:  Back pain beginning 2 months ago. Multiple recent falls. EXAM: MRI CERVICAL AND thoracic SPINE WITHOUT CONTRAST TECHNIQUE: Multiplanar and multiecho pulse sequences  of the cervical spine, to include the craniocervical junction and cervicothoracic junction, and lumbar spine, were obtained without intravenous contrast. COMPARISON:  None. FINDINGS: MRI CERVICAL SPINE FINDINGS Alignment: Normal Vertebrae: No fracture or primary bone lesion. Cord: To cord compression or primary cord lesion. Posterior Fossa, vertebral arteries, paraspinal tissues: Negative Disc levels: No abnormality at the foramen magnum, C1-2 or C2-3. C3-4: Right-sided predominant spondylosis. Mild to moderate right foraminal narrowing. No compressive central canal stenosis. C4-5 through C7-T1: Normal. MRI thoracic SPINE FINDINGS Alignment:  Normal Vertebrae: No fracture or primary bone  lesion. Ordinary small endplate Schmorl's nodes in the midthoracic region. Cord: No evidence of primary cord lesion. No evidence of abnormal cord T2 signal. Ample subarachnoid space present dorsal to the cord. See below. Paraspinal and other soft tissues: Negative Disc levels: No abnormality at T6-7 or above. T7-8: Central disc herniation effaces the ventral subarachnoid space and contacts the ventral cord. No foraminal extension. No cord compression. T8-9: Small central disc herniation indents the ventral subarachnoid space and contacts the ventral cord. No foraminal extension. No cord compression. T9-10: Right paracentral disc herniation effaces the ventral subarachnoid space on the right and contacts the right ventral cord. No foraminal extension. No cord compression. T10-11: Central disc herniation with upward migration of disc material behind the T10 vertebral body. Effacement of the ventral subarachnoid space with indentation of the cord more on the left. Ample subarachnoid space present dorsal to the cord. No foraminal extension. T11-12: Normal. T12-L1: Normal. IMPRESSION: Cervical spine: No acute or traumatic finding. Right-sided spondylosis at C3-4 with mild to moderate right foraminal narrowing. Thoracic spine: No acute or traumatic finding. Central disc herniations at T7-8 and T8-9 without likely neural compression. Central to right-sided disc herniation at T9-10 without likely neural compression. These could certainly be associated with back pain. T10-11 central disc herniation with upward migration behind T10 with some indentation of the left side of the cord. Ample subarachnoid space present dorsal to the cord, therefore cord compression is unlikely. This could also be associated with back pain however. Electronically Signed   By: Nelson Chimes M.D.   On: 11/15/2017 09:05   Mr Thoracic Spine Wo Contrast  Result Date: 11/15/2017 CLINICAL DATA:  Back pain beginning 2 months ago. Multiple recent falls.  EXAM: MRI CERVICAL AND thoracic SPINE WITHOUT CONTRAST TECHNIQUE: Multiplanar and multiecho pulse sequences of the cervical spine, to include the craniocervical junction and cervicothoracic junction, and lumbar spine, were obtained without intravenous contrast. COMPARISON:  None. FINDINGS: MRI CERVICAL SPINE FINDINGS Alignment: Normal Vertebrae: No fracture or primary bone lesion. Cord: To cord compression or primary cord lesion. Posterior Fossa, vertebral arteries, paraspinal tissues: Negative Disc levels: No abnormality at the foramen magnum, C1-2 or C2-3. C3-4: Right-sided predominant spondylosis. Mild to moderate right foraminal narrowing. No compressive central canal stenosis. C4-5 through C7-T1: Normal. MRI thoracic SPINE FINDINGS Alignment:  Normal Vertebrae: No fracture or primary bone lesion. Ordinary small endplate Schmorl's nodes in the midthoracic region. Cord: No evidence of primary cord lesion. No evidence of abnormal cord T2 signal. Ample subarachnoid space present dorsal to the cord. See below. Paraspinal and other soft tissues: Negative Disc levels: No abnormality at T6-7 or above. T7-8: Central disc herniation effaces the ventral subarachnoid space and contacts the ventral cord. No foraminal extension. No cord compression. T8-9: Small central disc herniation indents the ventral subarachnoid space and contacts the ventral cord. No foraminal extension. No cord compression. T9-10: Right paracentral disc herniation effaces the ventral subarachnoid space on the  right and contacts the right ventral cord. No foraminal extension. No cord compression. T10-11: Central disc herniation with upward migration of disc material behind the T10 vertebral body. Effacement of the ventral subarachnoid space with indentation of the cord more on the left. Ample subarachnoid space present dorsal to the cord. No foraminal extension. T11-12: Normal. T12-L1: Normal. IMPRESSION: Cervical spine: No acute or traumatic finding.  Right-sided spondylosis at C3-4 with mild to moderate right foraminal narrowing. Thoracic spine: No acute or traumatic finding. Central disc herniations at T7-8 and T8-9 without likely neural compression. Central to right-sided disc herniation at T9-10 without likely neural compression. These could certainly be associated with back pain. T10-11 central disc herniation with upward migration behind T10 with some indentation of the left side of the cord. Ample subarachnoid space present dorsal to the cord, therefore cord compression is unlikely. This could also be associated with back pain however. Electronically Signed   By: Nelson Chimes M.D.   On: 11/15/2017 09:05      Scheduled Meds: . aspirin  325 mg Oral QHS  . clopidogrel  75 mg Oral QHS  . enoxaparin (LOVENOX) injection  40 mg Subcutaneous Q24H  . ezetimibe  10 mg Oral QHS  . feeding supplement (GLUCERNA SHAKE)  237 mL Oral TID BM  . insulin aspart  0-20 Units Subcutaneous TID WC  . insulin aspart protamine- aspart  30 Units Subcutaneous BID WC  . irbesartan  300 mg Oral Daily  . ketorolac  30 mg Intravenous Q6H  . metoprolol succinate  25 mg Oral QHS  . rosuvastatin  40 mg Oral QHS  . tamsulosin  0.4 mg Oral QHS   Continuous Infusions:    LOS: 2 days    Time spent: 30 minutes    Calinda Stockinger Darleen Crocker, DO [facility] Pager [phone]  If 7PM-7AM, please contact night-coverage [email or web address] Password [email or web address] 11/15/2017, 1:07 PM

## 2017-11-15 NOTE — Progress Notes (Signed)
Physical Therapy Treatment Patient Details Name: Raymond [name] MRN: [id number] DOB: 1956-09-08 Today's Date: 11/15/2017    History of Present Illness 62 y/o male PMHx Anemia, CAD, MI, DM2, HTN/HLD w/ recent TURP last July and August for bladder tumor. Presents with generalized pelvic pain & numbness/weakness of BLE x3 weeks. Reports loss of 35 lbs x3 months due to decreased appetite and abd pain/distention. Symptoms concerning for tumor recurrence pressing on spinal cord.    PT Comments    Pt laying supine with wife present.  Flat affect and no communication reciprocated with therapist.  Pt given verbal cues for movement for supine to sit, however no response or movement from pateint.  Manual cues then given and still would not assist with movement.  Max-total assist to get LE's to EOB then total assist to get pateint into seated position.  Pt faced pain, however would not verbalize to therapist what or where the pain was.  Later reported pain in LE's and abdomen.    Attempted LE exercises, however would not follow instructions and would "push back" with active assistive ROM.  Pt asked if he was doing this purposefully, however would not communicate whether this was voluntary or involuntary.  PT required max assist to establish seated balance and then able to use UE and forward posturing to maintain.  Noted trembling in Rt UE with seated support of balance.  Pt sat EOB for 5 minutes, in which time therapist attempted LE therex and balance activites, however patient unresponsive to participate.  Required total assist X 2 to return patient to supine and properly position.  Pt fell asleep after session.            Precautions / Restrictions Precautions Precautions: Fall    Mobility  Bed Mobility Overal bed mobility: Needs Assistance Bed Mobility: Supine to Sit;Sit to Supine     Supine to sit: Total assist Sit to supine: Total assist   General bed mobility comments: first attempt at supine to  sit, pt 15% effort, then c/o LE's hurting and pushed back into supine.  Pt did not assist with transfer after this, requiring total assist              Balance Overall balance assessment: Needs assistance Sitting-balance support: Bilateral upper extremity supported Sitting balance-Leahy Scale: Fair Sitting balance - Comments: pt required max assist to establish balance and unable to maintain without bilataeral UE assist and forward leaning posture Postural control: Other (comment)                                  Cognition Arousal/Alertness: Lethargic Behavior During Therapy: Flat affect Overall Cognitive Status: Difficult to assess                                 General Comments: limited communication today with therapist      Exercises  Attempted, however patient would not follow instructions, no movement initiated        Pertinent Vitals/Pain Pain Assessment: Faces Faces Pain Scale: Hurts little more Pain Location: different areas.  LE's during transfer; initially then c/o abdomen pain when sat up on EOB Pain Descriptors / Indicators: (unable to provide descriptor or number) Pain Intervention(s): Limited activity within patient's tolerance;Repositioned;Monitored during session           PT Goals (current goals can now be found  in the care plan section) Progress towards PT goals: Not progressing toward goals - comment(pt required more assistance today)        End of Session   Activity Tolerance: Patient limited by lethargy;Patient limited by fatigue Patient left: in bed;with family/visitor present Nurse Communication: Mobility status       Time: 0093-8182 PT Time Calculation (min) (ACUTE ONLY): 28 min  Charges:  $Therapeutic Activity: 23-37 mins                      Raymond [name], PTA/CLT [phone]    Raymond [name] 11/15/2017, 5:02 PM

## 2017-11-15 NOTE — Progress Notes (Signed)
Night shift floor coverage note  The patient was seen due to rapidly progressing Ethelene Hal syndrome.  He was seen earlier by Dr. Merlene Laughter, neurologist on the case, which noticed that the patient's weakness was worse than his previous examination.  He discussed his findings and details of the case with me, then asked me to transfer the patient to the stepdown unit for close monitoring of his respiratory status, since he is at high risk for diaphragmatic paralysis and respiratory failure.  His chart was reviewed.  General.  NAD HEENT PERRLA, EOMI. Neck supple, no JVD Lungs clear to auscultation. Heart S1-S2 RRR Abdomen soft, nontender Extremities no edema Neurological: Please see detailed exam by Dr. Merlene Laughter below.  MENTAL STATUS: He is awake but has marked bradyphrenia. There is no spontaneous speech. He does follow commands with much prompting.  CRANIAL NERVES: Pupils are equal, round and reactive to light and accomodation; extra ocular movements are full, there is no significant nystagmus; visual fields are full; upper and lower facial muscles are normal in strength and symmetric, there is no flattening of the nasolabial folds; tongue is midline; uvula is midline; shoulder elevation is normal.  MOTOR: Left deltoid 4/5 and right deltoid 4/5. Right hip flexion is 2/5 in dorsiflexion 0. Left hip flexion is 2/5 in dorsiflexion 3.   COORDINATION: Left finger to nose is normal, right finger to nose is normal, No rest tremor; no intention tremor; no postural tremor; no bradykinesia.  REFLEXES: Deep tendon reflexes are symmetrical and normal in the upper extremities. He is areflexic in the legs.  SENSATION: He responds to painful stimuli in the legs.  Assessment: Worsening Gilliam Barr syndrome.  Plan: I made arrangements to get the patient transferred to SDU for close monitoring. Dr. Merlene Laughter will start IVIG therapy. Total CK level is still pending. I would like to thank Dr.  Merlene Laughter for his input.   Tennis Must, MD  About 45 minutes of critical care time were used during this event.  This document was prepared using Dragon voice recognition software and may contain some unintended transcription errors.

## 2017-11-16 DIAGNOSIS — M5432 Sciatica, left side: Secondary | ICD-10-CM

## 2017-11-16 DIAGNOSIS — M5431 Sciatica, right side: Secondary | ICD-10-CM

## 2017-11-16 LAB — CBC
HCT: 39 % (ref 39.0–52.0)
HEMOGLOBIN: 12.9 g/dL — AB (ref 13.0–17.0)
MCH: 28.5 pg (ref 26.0–34.0)
MCHC: 33.1 g/dL (ref 30.0–36.0)
MCV: 86.1 fL (ref 78.0–100.0)
Platelets: 324 10*3/uL (ref 150–400)
RBC: 4.53 MIL/uL (ref 4.22–5.81)
RDW: 13.7 % (ref 11.5–15.5)
WBC: 12.1 10*3/uL — ABNORMAL HIGH (ref 4.0–10.5)

## 2017-11-16 LAB — GLUCOSE, CAPILLARY
GLUCOSE-CAPILLARY: 191 mg/dL — AB (ref 65–99)
Glucose-Capillary: 160 mg/dL — ABNORMAL HIGH (ref 65–99)
Glucose-Capillary: 168 mg/dL — ABNORMAL HIGH (ref 65–99)
Glucose-Capillary: 176 mg/dL — ABNORMAL HIGH (ref 65–99)

## 2017-11-16 LAB — BASIC METABOLIC PANEL
Anion gap: 11 (ref 5–15)
BUN: 36 mg/dL — AB (ref 6–20)
CHLORIDE: 97 mmol/L — AB (ref 101–111)
CO2: 23 mmol/L (ref 22–32)
Calcium: 9.3 mg/dL (ref 8.9–10.3)
Creatinine, Ser: 0.77 mg/dL (ref 0.61–1.24)
GFR calc Af Amer: >60 mL/min (ref >60)
GFR calc non Af Amer: >60 mL/min (ref >60)
GLUCOSE: 204 mg/dL — AB (ref 65–99)
Potassium: 4.3 mmol/L (ref 3.5–5.1)
Sodium: 131 mmol/L — ABNORMAL LOW (ref 135–145)

## 2017-11-16 LAB — CK TOTAL AND CKMB (NOT AT ARMC)
CK TOTAL: 163 U/L (ref 49–397)
CK, MB: 2.9 ng/mL (ref 0.5–5.0)
Relative Index: 1.8 (ref 0.0–2.5)

## 2017-11-16 LAB — HOMOCYSTEINE: Homocysteine: 7.3 umol/L (ref 0.0–15.0)

## 2017-11-16 LAB — RPR: RPR Ser Ql: NONREACTIVE

## 2017-11-16 MED ORDER — ASPIRIN EC 81 MG PO TBEC
81.0000 mg | DELAYED_RELEASE_TABLET | Freq: Every day | ORAL | Status: DC
  Administered 2017-11-16 – 2017-11-19 (×4): 81 mg via ORAL
  Filled 2017-11-16 (×4): qty 1

## 2017-11-16 MED ORDER — IMMUNE GLOBULIN (HUMAN) 20 GM/200ML IV SOLN
400.0000 mg/kg | INTRAVENOUS | Status: AC
  Administered 2017-11-16 – 2017-11-19 (×4): 30 g via INTRAVENOUS
  Filled 2017-11-16 (×4): qty 100

## 2017-11-16 MED ORDER — ACETAMINOPHEN 325 MG PO TABS
650.0000 mg | ORAL_TABLET | Freq: Three times a day (TID) | ORAL | Status: DC
  Administered 2017-11-17 – 2017-11-20 (×9): 650 mg via ORAL
  Filled 2017-11-16 (×10): qty 2

## 2017-11-16 MED ORDER — GABAPENTIN 100 MG PO CAPS
100.0000 mg | ORAL_CAPSULE | Freq: Two times a day (BID) | ORAL | Status: DC
  Administered 2017-11-16 – 2017-11-18 (×5): 100 mg via ORAL
  Filled 2017-11-16 (×5): qty 1

## 2017-11-16 NOTE — Progress Notes (Signed)
  Speech Language Pathology Treatment: Dysphagia  Patient Details Name: ARMANDO BUKHARI MRN: [id number] DOB: 1956-08-02 Today's Date: 11/16/2017 Time: 3149-7026 SLP Time Calculation (min) (ACUTE ONLY): 23 min  Assessment / Plan / Recommendation Clinical Impression  Pt moved to ICU last night for closer monitoring for respiratory needs. Pt with improved mental status this AM and conversed with SLP. He does not recall events from previous day. His family is at bedside and also endorse improvement. Pt presented with ice chips, water via straw sips, and applesauce and shows no overt signs or symptoms of aspiration. D/W Dr. Dyann Kief and RN and ok for diet to be resumed. SLP visited with Pt and family after lunch meal and Pt with minimal intake of solids- will downgrade to mechanical soft for ease of mastication and energy conservation purposes. SLP to follow.    HPI HPI: GaryHandyis a61 y.o.male,with history of diabetes mellitus, CAD, status post transurethral resection of bladder tumor came to hospital with complaints of generalized weakness,pelvic pain and numbness for past three weeks. As per patient,he was seen by urologist yesterday and Foley catheter was inserted for urinary retention. Status post left hernia repair with mesh on 08/11/17 by Dr. Aviva Signs. He denies nausea vomiting or diarrhea. Denies abdominal pain. No chest pain or shortness of breath. Also complains of insomniafor past 3 to 4 weeks due to pain. Was prescribed trazodone withonly minimal relief. In the ED CT scan of the abdomen was done which showed only prostate enlargement lab work showed sodium 122. Reports loss of 35 lbs x3 months due to decreased appetite and abd pain/distention.       SLP Plan  Continue with current plan of care       Recommendations  Diet recommendations: Dysphagia 3 (mechanical soft) Liquids provided via: Cup;Straw Medication Administration: Whole meds with liquid Supervision: Patient able to  self feed;Full supervision/cueing for compensatory strategies Compensations: Minimize environmental distractions;Small sips/bites;Multiple dry swallows after each bite/sip Postural Changes and/or Swallow Maneuvers: Seated upright 90 degrees;Upright 30-60 min after meal                Oral Care Recommendations: Oral care BID;Staff/trained caregiver to provide oral care Follow up Recommendations: (pending) SLP Visit Diagnosis: Dysphagia, oropharyngeal phase (R13.12) Plan: Continue with current plan of care       Thank you,  Genene Churn, [place]                 [place] 11/16/2017, 1:43 PM

## 2017-11-16 NOTE — Progress Notes (Signed)
PROGRESS NOTE    Raymond [name]  TMA:[id number] DOB: 03/25/1956 DOA: 11/08/2017 PCP: Caren Macadam, MD   Brief Narrative:   This is a 62 year old male with recent TURP secondary to bladder tumor who presented to the hospital with generalized weakness, pelvic pain and numbness for past 3 weeks.  He has had significant urinary retention with recent Foley catheter placement.  He was noted to have significant generalized weakness with serum sodium of 122, related to poor PO intake, for which he was placed on IV normal saline with improvement into the normal range.  In speaking with his primary care provider Dr. Mannie Stabile, there was concern about some possible cauda equina and MRI of the lumbar spine was performed demonstrating some lower lumbar findings, but no cauda equina at the present. He continues to have weakness and is working with PT. He continues to have ongoing pain as well as significant weakness that cannot be attributed to hyponatremia at this point.  Family at the bedside is very concerned about the fact that he has not made any progress.  Therefore, neurology was consulted with further MRI imaging performed today with no major acute abnormalities noted; but suggesting disc protrusion at L5-S1 level.   Assessment & Plan:   Active Problems:   Hyponatremia   Protein-calorie malnutrition, severe   1. Generalized progressive weakness/with concerns for GBS.  This was initially thought to be related to hyponatremia secondary to poor oral intake, but this has improved with IV fluid administration.  Sodium levels have stayed in the 134-135 range at this time. IVF's discontinued currently.   Per neurology, concerns for GBS and empirically started on IVIG; antibodies testing pending. Will hold plavix and change ASA to 81 mg daily in case LP is needed down the road. PT recommending SNF. MRI with some disc protrusion around L5-S1; will add neurontin and continue NSAID's therapy.  Holding statins and  following CK level to r/o statins induced myopathy. Continue supportive care. 2. Dysphagia/anorexia/protein calorie malnutrition moderate. Appreciate nutrition service and speech therapy recommendations. Will continue dysphagia 3 and follow feeding supplements. Patient more alert and advise to increase PO intake. 3. Pelvic floor pain-ongoing.  Continue to have pain. Will stop narcotics. Continue toradol and start TID tylenol. Will also add neurontin. 4. Urinary retention secondary to BPH.  Continue Foley catheter. Continue flomax.  5. Constipation. Patient moved BM after enema on 2/16; no acute complaints currently. Not eating much; will also increase Physical activity 6. Status post transurethral resection of bladder tumor.  Continue outpatient follow up by urology. Good urine output appreciated. 7. Diabetes.  Overall fair control. Will continue current insulin therapy.   DVT prophylaxis:Lovenox Code Status: Full code Family Communication: wife and daughter at bedside  Disposition Plan: PT recommending SNF. Will add neurontin twice a day. Follow neurology work up and empiric treatment for presumed GBS.   Consultants:   Neurology Dr. Merlene Laughter  Procedures:   None  Antimicrobials:   None   Subjective: Patient with improved mentation, able to follow commands properly. Per family eating more. No CP or SOB currently. Patient required BIPAP usage overnight.  Objective: Vitals:   11/16/17 1634 11/16/17 1900 11/16/17 2000 11/16/17 2100  BP:  (!) 159/85 (!) 159/81 (!) 173/83  Pulse: 78 80 76 81  Resp: 20 20 20  (!) 23  Temp: (!) 97.5 F (36.4 C)  99.1 F (37.3 C)   TempSrc: Axillary  Oral   SpO2: 100% 100% 100% 100%  Weight:  Height:        Intake/Output Summary (Last 24 hours) at 11/16/2017 2117 Last data filed at 11/16/2017 1855 Gross per 24 hour  Intake -  Output 1050 ml  Net -1050 ml   Filed Weights   11/04/2017 2300 11/15/17 1528 11/16/17 0003  Weight: 81.2 kg (179  lb 0.2 oz) 81 kg (178 lb 9.2 oz) 80.8 kg (178 lb 2.1 oz)    Examination: General exam: comfortable, with significant improvement in mentation, complaining of lower back pain and numbness. No CP. Patient is afebrile. Respiratory system: good air movement, no wheezing, no crackles, no using accessory muscles. Cardiovascular system: S1 and S2, no rubs, no gallops, no JVD. Gastrointestinal system: soft, NT, ND, positive BS Central nervous system: AAOX3, able to follow commands, CN intact. Extremities: LE with MS 3/5 bilaterally. Rise leg test positive with pain in lower back and reporting numbness especially on right side. Skin: no open wounds appreciated; no induration, erythema or rashes.    Data Reviewed: I have personally reviewed following labs and imaging studies  CBC: Recent Labs  Lab 10/28/2017 1112 11/11/17 0449 11/12/17 0659 11/13/17 0648 11/14/17 0654 11/16/17 0554  WBC 12.9* 10.0 10.7* 11.0* 11.5* 12.1*  NEUTROABS 11.2*  --   --   --   --   --   HGB 13.7 13.3 13.1 12.7* 13.1 12.9*  HCT 39.9 38.7* 39.4 38.8* 39.3 39.0  MCV 83.6 83.6 85.3 85.7 85.6 86.1  PLT 326 311 311 292 289 956   Basic Metabolic Panel: Recent Labs  Lab 11/12/17 0659 11/13/17 0648 11/14/17 0654 11/15/17 0610 11/16/17 0554  NA 132* 132* 135 134* 131*  K 4.0 4.2 4.7 4.7 4.3  CL 95* 97* 98* 99* 97*  CO2 25 25 25 26 23   GLUCOSE 117* 155* 149* 143* 204*  BUN 20 17 17 19  36*  CREATININE 0.79 0.79 0.73 0.82 0.77  CALCIUM 9.2 8.8* 9.3 9.1 9.3   GFR: Estimated Creatinine Clearance: 106.8 mL/min (by C-G formula based on SCr of 0.77 mg/dL).   Liver Function Tests: Recent Labs  Lab 11/15/2017 1112 11/11/17 0449  AST 28 23  ALT 32 27  ALKPHOS 56 57  BILITOT 0.8 1.0  PROT 7.4 6.6  ALBUMIN 3.9 3.5   Recent Labs  Lab 11/17/2017 1112  LIPASE 44   Cardiac Enzymes: Recent Labs  Lab 11/15/17 2116  CKTOTAL 163  CKMB 2.9   CBG: Recent Labs  Lab 11/15/17 1620 11/15/17 2114 11/16/17 0825  11/16/17 1129 11/16/17 1637  GLUCAP 97 158* 191* 160* 168*   Anemia Panel: Recent Labs    11/14/17 2153  VITAMINB12 902    Radiology Studies: Mr Jodene Nam Head Wo Contrast  Result Date: 11/15/2017 CLINICAL DATA:  Multiple falls recently with back pain. EXAM: MRI HEAD WITHOUT CONTRAST MRA HEAD WITHOUT CONTRAST TECHNIQUE: Multiplanar, multiecho pulse sequences of the brain and surrounding structures were obtained without intravenous contrast. Angiographic images of the head were obtained using MRA technique without contrast. COMPARISON:  Head CT 03/25/2009 FINDINGS: MRI HEAD FINDINGS Brain: Diffusion imaging does not show any acute or subacute infarction. The brain shows generalized atrophy, progressive since 2010. The study does not show a pattern of advanced small vessel ischemic change, with only a few small foci within the hemispheric white matter. There is some layering material dependent within the ventricles, probably some old blood products related to the falls. The differential diagnosis would be layering debris secondary to meningitis. No evidence of mass lesion. No extra-axial collection.  Vascular: Major vessels at the base of the brain show flow. Skull and upper cervical spine: Negative Sinuses/Orbits: Clear/normal Other: None MRA HEAD FINDINGS Both internal carotid arteries are patent through the skull base and siphon regions. On the right, this vessel supplies only the middle cerebral artery. No MCA stenosis or occlusion. On the left, this vessel supplies the left middle cerebral artery and both anterior cerebral arteries. No stenosis, occlusion, aneurysm or high flow vascular malformation. Both vertebral arteries are widely patent to the basilar. No basilar stenosis. Posterior circulation branch vessels appear normal. IMPRESSION: Generalized brain atrophy, progressive since the CT scan of 2010. There are only minimal small vessel changes of the hemispheric white matter. Some layering material  in the occipital horns of lateral ventricles, probably blood products related to recent falls described clinically. Differential diagnosis does include debris secondary to meningitis, but that seems less likely. Negative intracranial MR angiography of the large and medium size vessels. 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 Electronically Signed   By: Nelson Chimes M.D.   On: 11/15/2017 08:32   Mr Brain Wo Contrast  Result Date: 11/15/2017 CLINICAL DATA:  Multiple falls recently with back pain. EXAM: MRI HEAD WITHOUT CONTRAST MRA HEAD WITHOUT CONTRAST TECHNIQUE: Multiplanar, multiecho pulse sequences of the brain and surrounding structures were obtained without intravenous contrast. Angiographic images of the head were obtained using MRA technique without contrast. COMPARISON:  Head CT 03/25/2009 FINDINGS: MRI HEAD FINDINGS Brain: Diffusion imaging does not show any acute or subacute infarction. The brain shows generalized atrophy, progressive since 2010. The study does not show a pattern of advanced small vessel ischemic change, with only a few small foci within the hemispheric white matter. There is some layering material dependent within the ventricles, probably some old blood products related to the falls. The differential diagnosis would be layering debris secondary to meningitis. No evidence of mass lesion. No extra-axial collection. Vascular: Major vessels at the base of the brain show flow. Skull and upper cervical spine: Negative Sinuses/Orbits: Clear/normal Other: None MRA HEAD FINDINGS Both internal carotid arteries are patent through the skull base and siphon regions. On the right, this vessel supplies only the middle cerebral artery. No MCA stenosis or occlusion. On the left, this vessel supplies the left middle cerebral artery and both anterior cerebral arteries. No stenosis, occlusion, aneurysm or high flow vascular  malformation. Both vertebral arteries are widely patent to the basilar. No basilar stenosis. Posterior circulation branch vessels appear normal. IMPRESSION: Generalized brain atrophy, progressive since the CT scan of 2010. There are only minimal small vessel changes of the hemispheric white matter. Some layering material in the occipital horns of lateral ventricles, probably blood products related to recent falls described clinically. Differential diagnosis does include debris secondary to meningitis, but that seems less likely. Negative intracranial MR angiography of the large and medium size vessels. 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 Electronically Signed   By: Nelson Chimes M.D.   On: 11/15/2017 08:32   Mr Cervical Spine Wo Contrast  Result Date: 11/15/2017 CLINICAL DATA:  Back pain beginning 2 months ago. Multiple recent falls. EXAM: MRI CERVICAL AND thoracic SPINE WITHOUT CONTRAST TECHNIQUE: Multiplanar and multiecho pulse sequences of the cervical spine, to include the craniocervical junction and cervicothoracic junction, and lumbar spine, were obtained without intravenous contrast. COMPARISON:  None. FINDINGS: MRI CERVICAL SPINE FINDINGS Alignment: Normal Vertebrae: No fracture or primary  bone lesion. Cord: To cord compression or primary cord lesion. Posterior Fossa, vertebral arteries, paraspinal tissues: Negative Disc levels: No abnormality at the foramen magnum, C1-2 or C2-3. C3-4: Right-sided predominant spondylosis. Mild to moderate right foraminal narrowing. No compressive central canal stenosis. C4-5 through C7-T1: Normal. MRI thoracic SPINE FINDINGS Alignment:  Normal Vertebrae: No fracture or primary bone lesion. Ordinary small endplate Schmorl's nodes in the midthoracic region. Cord: No evidence of primary cord lesion. No evidence of abnormal cord T2 signal. Ample subarachnoid space present dorsal to the cord. See  below. Paraspinal and other soft tissues: Negative Disc levels: No abnormality at T6-7 or above. T7-8: Central disc herniation effaces the ventral subarachnoid space and contacts the ventral cord. No foraminal extension. No cord compression. T8-9: Small central disc herniation indents the ventral subarachnoid space and contacts the ventral cord. No foraminal extension. No cord compression. T9-10: Right paracentral disc herniation effaces the ventral subarachnoid space on the right and contacts the right ventral cord. No foraminal extension. No cord compression. T10-11: Central disc herniation with upward migration of disc material behind the T10 vertebral body. Effacement of the ventral subarachnoid space with indentation of the cord more on the left. Ample subarachnoid space present dorsal to the cord. No foraminal extension. T11-12: Normal. T12-L1: Normal. IMPRESSION: Cervical spine: No acute or traumatic finding. Right-sided spondylosis at C3-4 with mild to moderate right foraminal narrowing. Thoracic spine: No acute or traumatic finding. Central disc herniations at T7-8 and T8-9 without likely neural compression. Central to right-sided disc herniation at T9-10 without likely neural compression. These could certainly be associated with back pain. T10-11 central disc herniation with upward migration behind T10 with some indentation of the left side of the cord. Ample subarachnoid space present dorsal to the cord, therefore cord compression is unlikely. This could also be associated with back pain however. Electronically Signed   By: Nelson Chimes M.D.   On: 11/15/2017 09:05   Mr Thoracic Spine Wo Contrast  Result Date: 11/15/2017 CLINICAL DATA:  Back pain beginning 2 months ago. Multiple recent falls. EXAM: MRI CERVICAL AND thoracic SPINE WITHOUT CONTRAST TECHNIQUE: Multiplanar and multiecho pulse sequences of the cervical spine, to include the craniocervical junction and cervicothoracic junction, and lumbar  spine, were obtained without intravenous contrast. COMPARISON:  None. FINDINGS: MRI CERVICAL SPINE FINDINGS Alignment: Normal Vertebrae: No fracture or primary bone lesion. Cord: To cord compression or primary cord lesion. Posterior Fossa, vertebral arteries, paraspinal tissues: Negative Disc levels: No abnormality at the foramen magnum, C1-2 or C2-3. C3-4: Right-sided predominant spondylosis. Mild to moderate right foraminal narrowing. No compressive central canal stenosis. C4-5 through C7-T1: Normal. MRI thoracic SPINE FINDINGS Alignment:  Normal Vertebrae: No fracture or primary bone lesion. Ordinary small endplate Schmorl's nodes in the midthoracic region. Cord: No evidence of primary cord lesion. No evidence of abnormal cord T2 signal. Ample subarachnoid space present dorsal to the cord. See below. Paraspinal and other soft tissues: Negative Disc levels: No abnormality at T6-7 or above. T7-8: Central disc herniation effaces the ventral subarachnoid space and contacts the ventral cord. No foraminal extension. No cord compression. T8-9: Small central disc herniation indents the ventral subarachnoid space and contacts the ventral cord. No foraminal extension. No cord compression. T9-10: Right paracentral disc herniation effaces the ventral subarachnoid space on the right and contacts the right ventral cord. No foraminal extension. No cord compression. T10-11: Central disc herniation with upward migration of disc material behind the T10 vertebral body. Effacement of the ventral subarachnoid space with  indentation of the cord more on the left. Ample subarachnoid space present dorsal to the cord. No foraminal extension. T11-12: Normal. T12-L1: Normal. IMPRESSION: Cervical spine: No acute or traumatic finding. Right-sided spondylosis at C3-4 with mild to moderate right foraminal narrowing. Thoracic spine: No acute or traumatic finding. Central disc herniations at T7-8 and T8-9 without likely neural compression.  Central to right-sided disc herniation at T9-10 without likely neural compression. These could certainly be associated with back pain. T10-11 central disc herniation with upward migration behind T10 with some indentation of the left side of the cord. Ample subarachnoid space present dorsal to the cord, therefore cord compression is unlikely. This could also be associated with back pain however. Electronically Signed   By: Nelson Chimes M.D.   On: 11/15/2017 09:05    Scheduled Meds: . aspirin EC  81 mg Oral Daily  . enoxaparin (LOVENOX) injection  40 mg Subcutaneous Q24H  . feeding supplement (ENSURE ENLIVE)  237 mL Oral BID BM  . gabapentin  100 mg Oral BID  . Immune Globulin 10%  400 mg/kg Intravenous Q24H  . insulin aspart  0-20 Units Subcutaneous TID WC  . insulin aspart protamine- aspart  30 Units Subcutaneous BID WC  . irbesartan  300 mg Oral Daily  . ketorolac  30 mg Intravenous Q6H  . metoprolol succinate  25 mg Oral Daily  . tamsulosin  0.4 mg Oral QHS   Continuous Infusions:    LOS: 3 days    Time spent: 30 minutes    Barton Dubois, MD [facility] Pager [phone]  If 7PM-7AM, please contact night-coverage [email or web address] Password [email or web address] 11/16/2017, 9:17 PM

## 2017-11-16 NOTE — Progress Notes (Signed)
Physical Therapy Treatment Patient Details Name: Raymond [name] MRN: [id number] DOB: 03/18/1956 Today's Date: 11/16/2017    History of Present Illness 62 y/o male PMHx Anemia, CAD, MI, DM2, HTN/HLD w/ recent TURP last July and August for bladder tumor. Presents with generalized pelvic pain & numbness/weakness of BLE x3 weeks. Reports loss of 35 lbs x3 months due to decreased appetite and abd pain/distention. Symptoms concerning for tumor recurrence pressing on spinal cord.    PT Comments    Patient is much improved from last therapy session and is alert upon PT arrival. He demonstrated improved AROM today performing LE exercises at EOB and required moderate assist with HOB elevated for supine to sit transfer. Patient had much improved activity tolerance and was able to perform unilateal reaching seated at EOB for balance training and required min guard for safety. Throughout transfers, balance, and LE exercises he reported moderate RPE scale. He will benefit from skilled PT to address current impairments and from follow up PT at below venue to address current impairments and improve mobility/function. Acute PT will follow.    Follow Up Recommendations  SNF;Supervision/Assistance - 24 hour     Equipment Recommendations  None recommended by PT    Recommendations for Other Services       Precautions / Restrictions Precautions Precautions: Fall Restrictions Weight Bearing Restrictions: No    Mobility  Bed Mobility Overal bed mobility: Needs Assistance Bed Mobility: Supine to Sit;Sit to Supine     Supine to sit: Mod assist;HOB elevated Sit to supine: Mod assist;HOB elevated   General bed mobility comments: patient requires verbal/tactile cues and assitance to sequence and position UE for rolling and sidleying to sit at EOB, manual assistance required for management of LE  Transfers      General transfer comment: Not tested today weakness and fatigue as well as safety concern. Will  assess on subsequent PT sessions as strength and activity tolerance improves.  Ambulation/Gait    General Gait Details: Not tested today weakness and fatigue as well as safety concern. Will assess on subsequent PT sessions as strength and activity tolerance improves.       Balance Overall balance assessment: Needs assistance Sitting-balance support: Feet supported;Single extremity supported Sitting balance-Leahy Scale: Good Sitting balance - Comments: min guard during seated balance activities at EOB; patient maintained upright balance while performign unilatearl UE reaching, 5x each UE       Standing balance comment: deferred due to weakness and safety concern       Cognition Arousal/Alertness: Awake/alert Behavior During Therapy: WFL for tasks assessed/performed;Flat affect Overall Cognitive Status: Within Functional Limits for tasks assessed    General Comments: limited communication today, patient requiring extra time to respond      Exercises General Exercises - Lower Extremity Long Arc Quad: Seated;AROM;Strengthening;Both;10 reps        Pertinent Vitals/Pain Pain Assessment: No/denies pain           PT Goals (current goals can now be found in the care plan section) Acute Rehab PT Goals Patient Stated Goal: return home with PLOF PT Goal Formulation: With patient/family Time For Goal Achievement: 11/25/17 Potential to Achieve Goals: Fair Progress towards PT goals: Progressing toward goals    Frequency    Min 3X/week      PT Plan Current plan remains appropriate       AM-PAC PT "6 Clicks" Daily Activity  Outcome Measure  Difficulty turning over in bed (including adjusting bedclothes, sheets and blankets)?: A Little Difficulty  moving from lying on back to sitting on the side of the bed? : A Lot Difficulty sitting down on and standing up from a chair with arms (e.g., wheelchair, bedside commode, etc,.)?: Unable Help needed moving to and from a bed to  chair (including a wheelchair)?: Total Help needed walking in hospital room?: Total Help needed climbing 3-5 steps with a railing? : Total 6 Click Score: 9    End of Session   Activity Tolerance: Patient limited by fatigue;Patient tolerated treatment well Patient left: in bed;with call bell/phone within reach;with family/visitor present;with bed alarm set Nurse Communication: Mobility status PT Visit Diagnosis: Unsteadiness on feet (R26.81);Difficulty in walking, not elsewhere classified (R26.2);Repeated falls (R29.6);Muscle weakness (generalized) (M62.81)     Time: 1610-9604 PT Time Calculation (min) (ACUTE ONLY): 27 min  Charges:  $Therapeutic Activity: 23-37 mins                    G Codes:       Kipp Brood, PT, DPT Physical Therapist with [facility]  11/16/2017 6:26 PM

## 2017-11-16 NOTE — Progress Notes (Signed)
Orders received for labs to be drawn for antibodies for HMG COA reductase. [facility] requires a consent for to be signed by the patient and ordering provider. Also, the MD is to specify the genes to be analyzed. Lab cannot draw until all signatures are completed. MD notified. Form placed in front of chart. Lab to be notified for draw when ready.

## 2017-11-16 NOTE — Progress Notes (Signed)
**Note De-Identified  Obfuscation** NIF -20  VC 2.6

## 2017-11-16 NOTE — Progress Notes (Signed)
**Note De-identified  Obfuscation** Patient removed from BIPAP and placed on 6 L [place].  Patient tolerating well at this time.  RRT to continue to monitor. 

## 2017-11-16 NOTE — Progress Notes (Signed)
[place] A. Merlene Laughter, MD     [email or web address]          Raymond [name] is an 62 y.o. male.   ASSESSMENT/PLAN: 1. Subacute leg weakness that is quite severe: The presentation is most consistent with Guillain-Barr syndrome.  Examination has worsened today both motor wise and breathing.  Extensive MRI failed to show an etiology. The imaging therefore supports the clinical picture that this is most likely Guillain-Barre syndrome.  Unfortunately, because the patient is on Plavix and the Lovenox will not be able to assess the spinal fluid.  Will treat the patient empirically however with immunoglobulins at the usual protocol 400 milligrams/kilogram for five days. There is a possibility that the patient's weakness could be due to myopathy related to statin use. CPK will be obtained. I will go ahead and discontinue the patient's statin. Also discontinue other medications not needed for now including his Plavix and zetia. Antibodies to HMG COA reductase will be obtained to evaluate for the delayed form of myopathy related to statin use. I will also check daily NIF and FV. Will continue with BiPAP for now.    2. Unexpected and unexplained encephalopathy: Reduce medications as above and minimize psychotropic medications.   The patient was sent to the ICU last night and placed on BiPAP. He appears to be more responsive on BiPAP. Family is at the bedside. The report that he is more responsive today which I agree with.    GENERAL: Patient appears about 66 years older than his stated age.  HEENT:  Neck is supple and no evidence of trauma  EXTREMITIES: No edema; significant pain on palpation of the upper extremities.   BACK: Normal  SKIN: Normal by inspection.    MENTAL STATUS: He is more awake today. He is more responsive.  CRANIAL NERVES: Pupils are equal, round and reactive to light and accomodation; extra ocular movements are full, there is no significant nystagmus; visual  fields are full; upper and lower facial muscles are normal in strength and symmetric, there is no flattening of the nasolabial folds; tongue is midline; uvula is midline; shoulder elevation is normal.  MOTOR: Left deltoid 4/5 and right deltoid 4/5. Right hip flexion is 2/5 in dorsiflexion 0. Left hip flexion is 3/5 in dorsiflexion 3.   COORDINATION: Left finger to nose is normal, right finger to nose is normal, No rest tremor; no intention tremor; no postural tremor; no bradykinesia.  REFLEXES: Deep tendon reflexes are symmetrical and normal in the upper extremities. He is areflexic in the legs.  SENSATION: He responds to painful stimuli in the legs.       Blood pressure (!) 157/85, pulse 87, temperature (!) 97.5 F (36.4 C), temperature source Axillary, resp. rate (!) 24, height 6' 1" (1.854 m), weight 178 lb 2.1 oz (80.8 kg), SpO2 99 %.  Past Medical History:  Diagnosis Date  . Anemia   . Arthritis    Hands and left knee  . Coronary artery disease   . Diabetes mellitus without complication ([place])   . High cholesterol   . Hypertension   . MI (myocardial infarction) ([place]) 2009    Past Surgical History:  Procedure Laterality Date  . APPENDECTOMY    . CARDIAC CATHETERIZATION    . COLONOSCOPY    . COLONOSCOPY N/A 04/01/2017   Procedure: COLONOSCOPY;  Surgeon: Daneil Dolin, MD;  Location: [facility];  Service: Endoscopy;  Laterality: N/A;  9:45 AM  . CORONARY STENT PLACEMENT  2009  x3  . CYSTOSCOPY N/A 05/16/2017   Procedure: CYSTOSCOPY;  Surgeon: Cleon Gustin, MD;  Location: [facility];  Service: Urology;  Laterality: N/A;  . CYSTOSCOPY W/ URETERAL STENT PLACEMENT Bilateral 04/11/2017   Procedure: CYSTOSCOPY WITH RETROGRADE PYELOGRAM;  Surgeon: Cleon Gustin, MD;  Location: [facility];  Service: Urology;  Laterality: Bilateral;  . FRACTURE SURGERY Right    Steel Rod in femur  . INGUINAL HERNIA REPAIR Left 08/15/2017   Procedure: HERNIA REPAIR INGUINAL ADULT WITH  MESH;  Surgeon: Aviva Signs, MD;  Location: [facility];  Service: General;  Laterality: Left;  . KNEE ARTHROSCOPY Left   . TONSILLECTOMY    . TRANSURETHRAL RESECTION OF BLADDER TUMOR N/A 04/11/2017   Procedure: TRANSURETHRAL RESECTION OF BLADDER TUMOR (TURBT);  Surgeon: Cleon Gustin, MD;  Location: [facility];  Service: Urology;  Laterality: N/A;  1 [id number]  . TRANSURETHRAL RESECTION OF BLADDER TUMOR N/A 05/16/2017   Procedure: TRANSURETHRAL RESECTION OF BLADDER TUMOR (TURBT);  Surgeon: Cleon Gustin, MD;  Location: [facility];  Service: Urology;  Laterality: N/A;    Family History  Problem Relation Age of Onset  . Diabetes Mother   . Heart attack Mother   . Cancer Father        lung  . Stroke Paternal Grandfather   . Colon cancer Paternal Uncle     Social History:  reports that  has never smoked. he has never used smokeless tobacco. He reports that he does not drink alcohol or use drugs.  Allergies: No Known Allergies  Medications: Prior to Admission medications   Medication Sig Start Date End Date Taking? Authorizing Provider  aspirin 325 MG tablet Take 325 mg by mouth at bedtime.    Yes [provider]  celecoxib (CELEBREX) 200 MG capsule Take 200 mg by mouth daily as needed for mild pain.    Yes [provider]  Choline Fenofibrate (FENOFIBRIC ACID) 135 MG CPDR Take 135 mg by mouth at bedtime.    Yes [provider]  CINNAMON PO Take 1,000 mg by mouth at bedtime.    Yes [provider]  clopidogrel (PLAVIX) 75 MG tablet TAKE 1 TABLET DAILY Patient taking differently: TAKE 1 TABLET AT BEDTIME 02/23/13  Yes Lorretta Harp, MD  Coenzyme Q10 (COQ10) 100 MG CAPS Take 100 mg by mouth at bedtime.   Yes [provider]  ezetimibe (ZETIA) 10 MG tablet Take 10 mg by mouth at bedtime.    Yes [provider]  ibuprofen (ADVIL,MOTRIN) 200 MG tablet Take 400 mg every 8 (eight) hours as needed by mouth  for mild pain.    Yes [provider]  insulin lispro protamine-lispro (HUMALOG 75/25 MIX) (75-25) 100 UNIT/ML SUSP injection Inject 30-35 Units into the skin 2 (two) times daily with a meal. Patient takes 35 units in the morning and 30 units at night   Yes [provider]  metFORMIN (GLUCOPHAGE) 500 MG tablet Take 1,000 mg by mouth 2 (two) times daily with a meal. 03/09/17  Yes [provider]  metoprolol succinate (TOPROL-XL) 25 MG 24 hr tablet Take 25 mg by mouth at bedtime. 02/08/17  Yes [provider]  Multiple Vitamin (MULTIVITAMIN) tablet Take 1 tablet by mouth daily.   Yes [provider]  naproxen sodium (ANAPROX) 220 MG tablet Take 440 mg by mouth daily as needed (pain).   Yes [provider]  nitroGLYCERIN (NITROSTAT) 0.4 MG SL tablet Place 0.4 mg every 5 (five) minutes  as needed under the tongue for chest pain.   Yes [provider]  Omega-3 Fatty Acids (FISH OIL) 1200 MG CAPS Take 1,200 mg by mouth at bedtime.    Yes [provider]  rosuvastatin (CRESTOR) 40 MG tablet Take 40 mg by mouth at bedtime.    Yes [provider]  sulfamethoxazole-trimethoprim (BACTRIM DS,SEPTRA DS) 800-160 MG tablet Take 1 tablet by mouth 2 (two) times daily.  11/02/17  Yes [provider]  tamsulosin (FLOMAX) 0.4 MG CAPS capsule Take 0.4 mg by mouth at bedtime.   Yes [provider]  telmisartan (MICARDIS) 80 MG tablet Take 80 mg by mouth at bedtime.    Yes [provider]  traMADol (ULTRAM) 50 MG tablet Take 50 mg by mouth every 8 (eight) hours as needed for moderate pain.  11/07/17  Yes [provider]  traZODone (DESYREL) 100 MG tablet Take 50-100 mg by mouth at bedtime as needed.  11/07/17  Yes [provider]  VICTOZA 18 MG/3ML SOPN Inject 1.8 mg into the skin daily. 03/03/17  Yes [provider]    Scheduled Meds: . aspirin  325 mg Oral QHS  . enoxaparin (LOVENOX) injection   40 mg Subcutaneous Q24H  . feeding supplement (ENSURE ENLIVE)  237 mL Oral BID BM  . Immune Globulin 10%  400 mg/kg Intravenous Q24H  . insulin aspart  0-20 Units Subcutaneous TID WC  . insulin aspart protamine- aspart  30 Units Subcutaneous BID WC  . irbesartan  300 mg Oral Daily  . ketorolac  30 mg Intravenous Q6H  . metoprolol succinate  25 mg Oral Daily  . tamsulosin  0.4 mg Oral QHS   Continuous Infusions:  PRN Meds:.ondansetron **OR** ondansetron (ZOFRAN) IV, oxyCODONE-acetaminophen, traZODone     Results for orders placed or performed during the hospital encounter of 11/18/2017 (from the past 48 hour(s))  Glucose, capillary     Status: Abnormal   Collection Time: 11/14/17 12:30 PM  Result Value Ref Range   Glucose-Capillary 201 (H) 65 - 99 mg/dL   Comment 1 Document in Chart   Glucose, capillary     Status: Abnormal   Collection Time: 11/14/17  5:04 PM  Result Value Ref Range   Glucose-Capillary 145 (H) 65 - 99 mg/dL  Glucose, capillary     Status: Abnormal   Collection Time: 11/14/17  9:44 PM  Result Value Ref Range   Glucose-Capillary 118 (H) 65 - 99 mg/dL  RPR     Status: None   Collection Time: 11/14/17  9:53 PM  Result Value Ref Range   RPR Ser Ql Non Reactive Non Reactive    Comment: (NOTE) Performed At: [facility] [place], [place] [id number] Rush Farmer MD [id number] Performed at [facility], [address] [place], [place] [address]   Vitamin B12     Status: None   Collection Time: 11/14/17  9:53 PM  Result Value Ref Range   Vitamin B-12 902 180 - 914 pg/mL    Comment: (NOTE) This assay is not validated for testing neonatal or myeloproliferative syndrome specimens for Vitamin B12 levels. Performed at [facility], [place] [address] [place], [place] [address]   C-reactive protein     Status: Abnormal   Collection Time: 11/14/17  9:53 PM  Result Value Ref Range   CRP 2.2 (H) <1.0 mg/dL    Comment: Performed at [facility], [place] [address], [place], [place] [address]  Sedimentation rate  Status: Abnormal   Collection Time: 11/14/17  9:53 PM  Result Value Ref Range   Sed Rate 47 (H) 0 - 16 mm/hr    Comment: Performed at [facility], [address] [place], [place] [address]  Basic metabolic panel     Status: Abnormal   Collection Time: 11/15/17  6:10 AM  Result Value Ref Range   Sodium 134 (L) 135 - 145 mmol/L   Potassium 4.7 3.5 - 5.1 mmol/L   Chloride 99 (L) 101 - 111 mmol/L   CO2 26 22 - 32 mmol/L   Glucose, Bld 143 (H) 65 - 99 mg/dL   BUN 19 6 - 20 mg/dL   Creatinine, Ser 0.82 0.61 - 1.24 mg/dL   Calcium 9.1 8.9 - 10.3 mg/dL   GFR calc non Af Amer >60 >60 mL/min   GFR calc Af Amer >60 >60 mL/min    Comment: (NOTE) The eGFR has been calculated using the CKD EPI equation. This calculation has not been validated in all clinical situations. eGFR's persistently <60 mL/min signify possible Chronic Kidney Disease.    Anion gap 9 5 - 15    Comment: Performed at [facility], [address], [place], [place] [address]  Glucose, capillary     Status: Abnormal   Collection Time: 11/15/17  8:22 AM  Result Value Ref Range   Glucose-Capillary 190 (H) 65 - 99 mg/dL   Comment 1 Notify RN    Comment 2 Document in Chart   Glucose, capillary     Status: Abnormal   Collection Time: 11/15/17 11:45 AM  Result Value Ref Range   Glucose-Capillary 133 (H) 65 - 99 mg/dL   Comment 1 Notify RN    Comment 2 Document in Chart   Glucose, capillary     Status: None   Collection Time: 11/15/17  4:20 PM  Result Value Ref Range   Glucose-Capillary 97 65 - 99 mg/dL   Comment 1 Notify RN    Comment 2 Document in Chart   Glucose, capillary     Status: Abnormal   Collection Time: 11/15/17  9:14 PM  Result Value Ref Range   Glucose-Capillary 158 (H) 65 - 99 mg/dL   Comment 1 Notify RN    Comment 2 Document in Chart   CBC     Status: Abnormal   Collection Time: 11/16/17  5:54 AM  Result Value Ref Range    WBC 12.1 (H) 4.0 - 10.5 K/uL   RBC 4.53 4.22 - 5.81 MIL/uL   Hemoglobin 12.9 (L) 13.0 - 17.0 g/dL   HCT 39.0 39.0 - 52.0 %   MCV 86.1 78.0 - 100.0 fL   MCH 28.5 26.0 - 34.0 pg   MCHC 33.1 30.0 - 36.0 g/dL   RDW 13.7 11.5 - 15.5 %   Platelets 324 150 - 400 K/uL    Comment: Performed at [facility], [address], [place], [place] [address]  Basic metabolic panel     Status: Abnormal   Collection Time: 11/16/17  5:54 AM  Result Value Ref Range   Sodium 131 (L) 135 - 145 mmol/L   Potassium 4.3 3.5 - 5.1 mmol/L   Chloride 97 (L) 101 - 111 mmol/L   CO2 23 22 - 32 mmol/L   Glucose, Bld 204 (H) 65 - 99 mg/dL   BUN 36 (H) 6 - 20 mg/dL   Creatinine, Ser 0.77 0.61 - 1.24 mg/dL   Calcium 9.3 8.9 - 10.3 mg/dL   GFR calc non  Af Amer >60 >60 mL/min   GFR calc Af Amer >60 >60 mL/min    Comment: (NOTE) The eGFR has been calculated using the CKD EPI equation. This calculation has not been validated in all clinical situations. eGFR's persistently <60 mL/min signify possible Chronic Kidney Disease.    Anion gap 11 5 - 15    Comment: Performed at [facility], [address] [place], [place] [address]  Glucose, capillary     Status: Abnormal   Collection Time: 11/16/17  8:25 AM  Result Value Ref Range   Glucose-Capillary 191 (H) 65 - 99 mg/dL    Studies/Results:  L SPINE MRI FINDINGS: Segmentation: 5 non rib-bearing lumbar type vertebral bodies are present.  Alignment:  AP alignment is anatomic.  Vertebrae:  Marrow signal and vertebral body heights are normal.  Conus medullaris and cauda equina: Conus extends to the L1-2 level. Conus and cauda equina appear normal.  Paraspinal and other soft tissues: Limited imaging of the abdomen is unremarkable. There is no significant adenopathy.  Disc levels:  L1-2: Negative.  L2-3: Negative.  L3-4: Mild facet hypertrophy is present bilaterally. No significant disc protrusion or stenosis is present.  L4-5: A right paramedian  disc protrusion and annular tear is present. There is potential impact on the traversing right L5 nerve roots. The foramina are patent.  L5-S1: A shallow central disc protrusion and annular tear is present without focal stenosis. The foramina are patent.  IMPRESSION: 1. Right paramedian disc protrusion and annular tear at L4-5 with potential impact on the traversing right L5 nerve roots. 2. Shallow central disc protrusion and annular tear at L5-S1 without focal stenosis.   Lumbar spine MRI is reviewed in person.  There is small disc protrusion at L4-L5 and L5-S1.  The protrusion at each level does not appear to cause significant compromise.     ADB PELVIC CT FINDINGS: Lower chest: Lung bases clear  Hepatobiliary: Gallbladder and liver normal appearance  Pancreas: Normal appearance  Spleen: Normal appearance  Adrenals/Urinary Tract: Minimal chronic thickening of LEFT adrenal gland without discrete mass. Kidneys and ureters normal appearance. Foley catheter decompresses urinary bladder. Bladder mass identified on the previous exam not visualized.  Stomach/Bowel: Appendix surgically absent by history. Stomach and bowel loops normal appearance  Vascular/Lymphatic: Atherosclerotic calcifications aorta and coronary arteries without aortic aneurysm. Few normal sized to mildly enlarged lymph nodes at the LEFT inguinal region.  Reproductive: Mild prostatic enlargement gland measuring 5.9 x 4.1 x 3.2 cm. Seminal vesicles unremarkable.  Other: Infiltrative changes at LEFT inguinal region, by history inguinal herniorrhaphy 08/15/2017. Small focal fluid collection at LEFT inguinal hernia repair 21 x 9 mm image 83 question seroma or lymphocele, infection considered less likely this far removed from surgery though not completely excluded. No free intraperitoneal air or fluid.  Musculoskeletal: Unremarkable  IMPRESSION: Prostatic enlargement.  No acute  intra-abdominal or intrapelvic abnormalities.  Postoperative changes of LEFT inguinal herniorrhaphy with reactive lymph nodes and a 21 x 9 mm fluid collection at the surgical bed favor seroma versus lymphocele as discussed above.     BRAIN MRI MRA  FINDINGS: MRI HEAD FINDINGS  Brain: Diffusion imaging does not show any acute or subacute infarction. The brain shows generalized atrophy, progressive since 2010. The study does not show a pattern of advanced small vessel ischemic change, with only a few small foci within the hemispheric white matter. There is some layering material dependent within the ventricles, probably some old blood products related to the falls. The differential  diagnosis would be layering debris secondary to meningitis. No evidence of mass lesion. No extra-axial collection.  Vascular: Major vessels at the base of the brain show flow.  Skull and upper cervical spine: Negative  Sinuses/Orbits: Clear/normal  Other: None  MRA HEAD FINDINGS  Both internal carotid arteries are patent through the skull base and siphon regions. On the right, this vessel supplies only the middle cerebral artery. No MCA stenosis or occlusion. On the left, this vessel supplies the left middle cerebral artery and both anterior cerebral arteries. No stenosis, occlusion, aneurysm or high flow vascular malformation.  Both vertebral arteries are widely patent to the basilar. No basilar stenosis. Posterior circulation branch vessels appear normal.  IMPRESSION: Generalized brain atrophy, progressive since the CT scan of 2010. There are only minimal small vessel changes of the hemispheric white matter.  Some layering material in the occipital horns of lateral ventricles, probably blood products related to recent falls described clinically. Differential diagnosis does include debris secondary to meningitis, but that seems less likely.  Negative intracranial MR  angiography of the large and medium size vessels. 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      MRI  C AND T SPINE  FINDINGS: MRI CERVICAL SPINE FINDINGS  Alignment: Normal  Vertebrae: No fracture or primary bone lesion.  Cord: To cord compression or primary cord lesion.  Posterior Fossa, vertebral arteries, paraspinal tissues: Negative  Disc levels:  No abnormality at the foramen magnum, C1-2 or C2-3.  C3-4: Right-sided predominant spondylosis. Mild to moderate right foraminal narrowing. No compressive central canal stenosis.  C4-5 through C7-T1: Normal.  MRI thoracic SPINE FINDINGS  Alignment:  Normal  Vertebrae: No fracture or primary bone lesion. Ordinary small endplate Schmorl's nodes in the midthoracic region.  Cord: No evidence of primary cord lesion. No evidence of abnormal cord T2 signal. Ample subarachnoid space present dorsal to the cord. See below.  Paraspinal and other soft tissues: Negative  Disc levels:  No abnormality at T6-7 or above.  T7-8: Central disc herniation effaces the ventral subarachnoid space and contacts the ventral cord. No foraminal extension. No cord compression.  T8-9: Small central disc herniation indents the ventral subarachnoid space and contacts the ventral cord. No foraminal extension. No cord compression.  T9-10: Right paracentral disc herniation effaces the ventral subarachnoid space on the right and contacts the right ventral cord. No foraminal extension. No cord compression.  T10-11: Central disc herniation with upward migration of disc material behind the T10 vertebral body. Effacement of the ventral subarachnoid space with indentation of the cord more on the left. Ample subarachnoid space present dorsal to the cord. No foraminal extension.  T11-12: Normal.  T12-L1: Normal.  IMPRESSION: Cervical spine: No acute  or traumatic finding. Right-sided spondylosis at C3-4 with mild to moderate right foraminal narrowing.  Thoracic spine: No acute or traumatic finding. Central disc herniations at T7-8 and T8-9 without likely neural compression. Central to right-sided disc herniation at T9-10 without likely neural compression. These could certainly be associated with back pain. T10-11 central disc herniation with upward migration behind T10 with some indentation of the left side of the cord. Ample subarachnoid space present dorsal to the cord, therefore cord compression is unlikely. This could also be associated with back pain however.      THE IMAGING ARE REVIEWED IN PERSON.  A brain MRI shows a tiny increased signal involving the right posterior aspect of the temporal horn on DWI.  This is not seen on the ADC scan.  Significance is unclear.  No other evidence of acute changes are noted.  There is moderate global atrophy more prominent than expected for age.  There is minimal white matter changes seen on FLAIR imaging.  No hemorrhages appreciated.  The cervical and the thoracic spine MRI is also reviewed.  The thoracic MRI is surprisingly under well min.  The thoracic MRI shows a large disc herniation posterior  At T10-T11 which encroach is the spinal cord but there is adequate room posteriorly.  There is a smaller disc at T7 T8.        Kalimah Capurro A. Merlene Laughter, M.D.  Diplomate, Tax adviser of Psychiatry and Neurology ( Neurology). 11/16/2017, 10:05 AM

## 2017-11-16 NOTE — Progress Notes (Signed)
Nutrition Brief Note  MD had ordered calorie count beginning yesterday afternoon. However, pt had decompensated respiratory wise and was made NPO and transferred to stepdown. Prior to decompensation, pt ate nothing for dinner. Was NPO for breakfast this morning.   Pt was seen by ST shortly after lunch and was noted to have minimal intake of solids. Diet was downgraded.  Wife reports patient was significantly improved this AM shortly after he came off of BIPAP. He has returned to being largely somnolent when seen this afternoon.   Today at [place], Wife says pt ate: 2 Bites of potatoes, 3-4 small bites of chicken and 4 pieces of pineapple. He did not receive icecream.   Spoke with dietary again to try to get patient icecream/pudding. Will restart calorie count. ICU RN made aware.   Burtis Junes RD, LDN, CNSC Clinical Nutrition Pager: [phone] 11/16/2017 2:26 PM

## 2017-11-16 NOTE — Progress Notes (Signed)
Respiratory care note:  NIF -50  VC 2.3

## 2017-11-16 NOTE — Progress Notes (Signed)
Constantly removing devices and fooling with BiPAP having removed it twice.

## 2017-11-17 LAB — GLUCOSE, CAPILLARY
GLUCOSE-CAPILLARY: 252 mg/dL — AB (ref 65–99)
GLUCOSE-CAPILLARY: 214 mg/dL — AB (ref 65–99)
Glucose-Capillary: 242 mg/dL — ABNORMAL HIGH (ref 65–99)
Glucose-Capillary: 238 mg/dL — ABNORMAL HIGH (ref 65–99)

## 2017-11-17 LAB — CREATININE, SERUM
CREATININE: 0.7 mg/dL (ref 0.61–1.24)
GFR calc Af Amer: >60 mL/min (ref >60)

## 2017-11-17 NOTE — Progress Notes (Signed)
**Note De-Identified  Obfuscation** VC 2.6  NIF -30

## 2017-11-17 NOTE — Progress Notes (Signed)
NIF -40 VC 3L

## 2017-11-17 NOTE — Progress Notes (Signed)
Patient doing well at this time without BIPAP. Does not wish to go on machine unless he needs to. RR 18 O2 sat 100% on 4 lpm (turned down to 3 lpm). Discussed with RN will continue to monitor.

## 2017-11-17 NOTE — Progress Notes (Signed)
Kimberly A. Merlene Laughter, MD     [email or web address]          Raymond [name] is an 62 y.o. male.   ASSESSMENT/PLAN: 1. Subacute leg weakness that is quite severe: The presentation is most consistent with Guillain-Barr syndrome. On immunoglobulins at the usual protocol 400 milligrams/kilogram for five days. He is 3/5 days today. We will continue to hold his statins, Plavix and the zetia. In particular concerned about delayed statin myopathy due to antibodies to HMG COA Reductase. We are trying to testing for this but is a send out and the quite difficult to obtain. His respiratory mechanics are stable. Nutrition is of very serious concern however as it appears he is not taking in enough to sustain recovery. This is being addressed with a calorie count. Will continue with BiPAP for now.    2. Unexpected and unexplained encephalopathy: Reduce medications as above and minimize psychotropic medications.    Overall patient seems unchanged but appear intake is poor.   GENERAL: Patient appears about 7 years older than his stated age.  HEENT:  Neck is supple and no evidence of trauma  EXTREMITIES: No edema; significant pain on palpation of the upper extremities.   BACK: Normal  SKIN: Normal by inspection.    MENTAL STATUS: He is more awake today. He is more responsive.  CRANIAL NERVES: Pupils are equal, round and reactive to light and accomodation; extra ocular movements are full, there is no significant nystagmus; visual fields are full; upper and lower facial muscles are normal in strength and symmetric, there is no flattening of the nasolabial folds; tongue is midline; uvula is midline; shoulder elevation is normal.  MOTOR: Left deltoid 3/5 and right deltoid 4+/5. Right hip flexion is 2/5 in dorsiflexion 0. Left hip flexion is 3/5 in dorsiflexion 3.   COORDINATION: Left finger to nose is normal, right finger to nose is normal, No rest tremor; no intention tremor; no  postural tremor; no bradykinesia.  REFLEXES: He is areflexic in the legs.  SENSATION: He responds to painful stimuli in the legs.       Blood pressure 132/68, pulse 85, temperature 98.9 F (37.2 C), temperature source Oral, resp. rate (!) 24, height '6\' 1"'  (1.854 m), weight 174 lb 2.6 oz (79 kg), SpO2 100 %.  Past Medical History:  Diagnosis Date  . Anemia   . Arthritis    Hands and left knee  . Coronary artery disease   . Diabetes mellitus without complication ([place])   . High cholesterol   . Hypertension   . MI (myocardial infarction) ([place]) 2009    Past Surgical History:  Procedure Laterality Date  . APPENDECTOMY    . CARDIAC CATHETERIZATION    . COLONOSCOPY    . COLONOSCOPY N/A 04/01/2017   Procedure: COLONOSCOPY;  Surgeon: Daneil Dolin, MD;  Location: [facility];  Service: Endoscopy;  Laterality: N/A;  9:45 AM  . CORONARY STENT PLACEMENT  2009   x3  . CYSTOSCOPY N/A 05/16/2017   Procedure: CYSTOSCOPY;  Surgeon: Cleon Gustin, MD;  Location: [facility] OR[facility];  Service: Urology;  Laterality: N/A;  . CYSTOSCOPY W/ URETERAL STENT PLACEMENT Bilateral 04/11/2017   Procedure: CYSTOSCOPY WITH RETROGRADE PYELOGRAM;  Surgeon: Cleon Gustin, MD;  Location: [facility];  Service: Urology;  Laterality: Bilateral;  . FRACTURE SURGERY Right    Steel Rod in femur  . INGUINAL HERNIA REPAIR Left 08/15/2017   Procedure: HERNIA REPAIR INGUINAL ADULT WITH MESH;  Surgeon: Aviva Signs,  MD;  Location: [facility];  Service: General;  Laterality: Left;  . KNEE ARTHROSCOPY Left   . TONSILLECTOMY    . TRANSURETHRAL RESECTION OF BLADDER TUMOR N/A 04/11/2017   Procedure: TRANSURETHRAL RESECTION OF BLADDER TUMOR (TURBT);  Surgeon: Cleon Gustin, MD;  Location: [facility];  Service: Urology;  Laterality: N/A;  1 [id number]  . TRANSURETHRAL RESECTION OF BLADDER TUMOR N/A 05/16/2017   Procedure: TRANSURETHRAL RESECTION OF BLADDER TUMOR (TURBT);  Surgeon: Cleon Gustin, MD;  Location: [facility];  Service: Urology;  Laterality: N/A;    Family History  Problem Relation Age of Onset  . Diabetes Mother   . Heart attack Mother   . Cancer Father        lung  . Stroke Paternal Grandfather   . Colon cancer Paternal Uncle     Social History:  reports that  has never smoked. he has never used smokeless tobacco. He reports that he does not drink alcohol or use drugs.  Allergies: No Known Allergies  Medications: Prior to Admission medications   Medication Sig Start Date End Date Taking? Authorizing Provider  aspirin 325 MG tablet Take 325 mg by mouth at bedtime.    Yes [provider]  celecoxib (CELEBREX) 200 MG capsule Take 200 mg by mouth daily as needed for mild pain.    Yes [provider]  Choline Fenofibrate (FENOFIBRIC ACID) 135 MG CPDR Take 135 mg by mouth at bedtime.    Yes [provider]  CINNAMON PO Take 1,000 mg by mouth at bedtime.    Yes [provider]  clopidogrel (PLAVIX) 75 MG tablet TAKE 1 TABLET DAILY Patient taking differently: TAKE 1 TABLET AT BEDTIME 02/23/13  Yes Lorretta Harp, MD  Coenzyme Q10 (COQ10) 100 MG CAPS Take 100 mg by mouth at bedtime.   Yes [provider]  ezetimibe (ZETIA) 10 MG tablet Take 10 mg by mouth at bedtime.    Yes [provider]  ibuprofen (ADVIL,MOTRIN) 200 MG tablet Take 400 mg every 8 (eight) hours as needed by mouth for mild pain.    Yes [provider]  insulin lispro protamine-lispro (HUMALOG 75/25 MIX) (75-25) 100 UNIT/ML SUSP injection Inject 30-35 Units into the skin 2 (two) times daily with a meal. Patient takes 35 units in the morning and 30 units at night   Yes [provider]  metFORMIN (GLUCOPHAGE) 500 MG tablet Take 1,000 mg by mouth 2 (two) times daily with a meal. 03/09/17  Yes [provider]  metoprolol succinate (TOPROL-XL) 25 MG 24 hr tablet Take 25 mg by mouth at bedtime. 02/08/17  Yes [provider]    Multiple Vitamin (MULTIVITAMIN) tablet Take 1 tablet by mouth daily.   Yes [provider]  naproxen sodium (ANAPROX) 220 MG tablet Take 440 mg by mouth daily as needed (pain).   Yes [provider]  nitroGLYCERIN (NITROSTAT) 0.4 MG SL tablet Place 0.4 mg every 5 (five) minutes as needed under the tongue for chest pain.   Yes [provider]  Omega-3 Fatty Acids (FISH OIL) 1200 MG CAPS Take 1,200 mg by mouth at bedtime.    Yes [provider]  rosuvastatin (CRESTOR) 40 MG tablet Take 40 mg by mouth at bedtime.    Yes [provider]  sulfamethoxazole-trimethoprim (BACTRIM DS,SEPTRA DS) 800-160 MG tablet Take 1 tablet by mouth 2 (two) times daily.  11/02/17  Yes [provider]  tamsulosin (FLOMAX) 0.4 MG CAPS capsule Take  0.4 mg by mouth at bedtime.   Yes [provider]  telmisartan (MICARDIS) 80 MG tablet Take 80 mg by mouth at bedtime.    Yes [provider]  traMADol (ULTRAM) 50 MG tablet Take 50 mg by mouth every 8 (eight) hours as needed for moderate pain.  11/07/17  Yes [provider]  traZODone (DESYREL) 100 MG tablet Take 50-100 mg by mouth at bedtime as needed.  11/07/17  Yes [provider]  VICTOZA 18 MG/3ML SOPN Inject 1.8 mg into the skin daily. 03/03/17  Yes [provider]    Scheduled Meds: . acetaminophen  650 mg Oral Q8H  . aspirin EC  81 mg Oral Daily  . enoxaparin (LOVENOX) injection  40 mg Subcutaneous Q24H  . feeding supplement (ENSURE ENLIVE)  237 mL Oral BID BM  . gabapentin  100 mg Oral BID  . Immune Globulin 10%  400 mg/kg Intravenous Q24H  . insulin aspart  0-20 Units Subcutaneous TID WC  . insulin aspart protamine- aspart  30 Units Subcutaneous BID WC  . irbesartan  300 mg Oral Daily  . ketorolac  30 mg Intravenous Q6H  . metoprolol succinate  25 mg Oral Daily  . tamsulosin  0.4 mg Oral QHS   Continuous Infusions:  PRN Meds:.ondansetron **OR** ondansetron (ZOFRAN)  IV, traZODone     Results for orders placed or performed during the hospital encounter of 11/05/2017 (from the past 48 hour(s))  Glucose, capillary     Status: Abnormal   Collection Time: 11/15/17  9:14 PM  Result Value Ref Range   Glucose-Capillary 158 (H) 65 - 99 mg/dL   Comment 1 Notify RN    Comment 2 Document in Chart   CK total and CKMB (cardiac)not at [facility]     Status: None   Collection Time: 11/15/17  9:16 PM  Result Value Ref Range   Total CK 163 49 - 397 U/L   CK, MB 2.9 0.5 - 5.0 ng/mL   Relative Index 1.8 0.0 - 2.5    Comment: Performed at [facility] [address], [place], [place] [address]  CBC     Status: Abnormal   Collection Time: 11/16/17  5:54 AM  Result Value Ref Range   WBC 12.1 (H) 4.0 - 10.5 K/uL   RBC 4.53 4.22 - 5.81 MIL/uL   Hemoglobin 12.9 (L) 13.0 - 17.0 g/dL   HCT 39.0 39.0 - 52.0 %   MCV 86.1 78.0 - 100.0 fL   MCH 28.5 26.0 - 34.0 pg   MCHC 33.1 30.0 - 36.0 g/dL   RDW 13.7 11.5 - 15.5 %   Platelets 324 150 - 400 K/uL    Comment: Performed at [facility], [address] [place], [place] [address]  Basic metabolic panel     Status: Abnormal   Collection Time: 11/16/17  5:54 AM  Result Value Ref Range   Sodium 131 (L) 135 - 145 mmol/L   Potassium 4.3 3.5 - 5.1 mmol/L   Chloride 97 (L) 101 - 111 mmol/L   CO2 23 22 - 32 mmol/L   Glucose, Bld 204 (H) 65 - 99 mg/dL   BUN 36 (H) 6 - 20 mg/dL   Creatinine, Ser 0.77 0.61 - 1.24 mg/dL   Calcium 9.3 8.9 - 10.3 mg/dL   GFR calc non Af Amer >60 >60 mL/min   GFR calc Af Amer >60 >60 mL/min    Comment: (NOTE) The eGFR has been calculated using the CKD  EPI equation. This calculation has not been validated in all clinical situations. eGFR's persistently <60 mL/min signify possible Chronic Kidney Disease.    Anion gap 11 5 - 15    Comment: Performed at [facility], [address] [place], [place] [address]  Glucose, capillary     Status: Abnormal   Collection Time: 11/16/17  8:25 AM  Result  Value Ref Range   Glucose-Capillary 191 (H) 65 - 99 mg/dL  Glucose, capillary     Status: Abnormal   Collection Time: 11/16/17 11:29 AM  Result Value Ref Range   Glucose-Capillary 160 (H) 65 - 99 mg/dL  Glucose, capillary     Status: Abnormal   Collection Time: 11/16/17  4:37 PM  Result Value Ref Range   Glucose-Capillary 168 (H) 65 - 99 mg/dL  Glucose, capillary     Status: Abnormal   Collection Time: 11/16/17  9:09 PM  Result Value Ref Range   Glucose-Capillary 176 (H) 65 - 99 mg/dL   Comment 1 Notify RN   Creatinine, serum     Status: None   Collection Time: 11/17/17  5:06 AM  Result Value Ref Range   Creatinine, Ser 0.70 0.61 - 1.24 mg/dL   GFR calc non Af Amer >60 >60 mL/min   GFR calc Af Amer >60 >60 mL/min    Comment: (NOTE) The eGFR has been calculated using the CKD EPI equation. This calculation has not been validated in all clinical situations. eGFR's persistently <60 mL/min signify possible Chronic Kidney Disease. Performed at [facility], [address] [place], [place] [address]   Glucose, capillary     Status: Abnormal   Collection Time: 11/17/17  8:52 AM  Result Value Ref Range   Glucose-Capillary 242 (H) 65 - 99 mg/dL  Glucose, capillary     Status: Abnormal   Collection Time: 11/17/17 11:32 AM  Result Value Ref Range   Glucose-Capillary 252 (H) 65 - 99 mg/dL  Glucose, capillary     Status: Abnormal   Collection Time: 11/17/17  4:39 PM  Result Value Ref Range   Glucose-Capillary 214 (H) 65 - 99 mg/dL    Studies/Results:  L SPINE MRI FINDINGS: Segmentation: 5 non rib-bearing lumbar type vertebral bodies are present.  Alignment:  AP alignment is anatomic.  Vertebrae:  Marrow signal and vertebral body heights are normal.  Conus medullaris and cauda equina: Conus extends to the L1-2 level. Conus and cauda equina appear normal.  Paraspinal and other soft tissues: Limited imaging of the abdomen is unremarkable. There is no significant  adenopathy.  Disc levels:  L1-2: Negative.  L2-3: Negative.  L3-4: Mild facet hypertrophy is present bilaterally. No significant disc protrusion or stenosis is present.  L4-5: A right paramedian disc protrusion and annular tear is present. There is potential impact on the traversing right L5 nerve roots. The foramina are patent.  L5-S1: A shallow central disc protrusion and annular tear is present without focal stenosis. The foramina are patent.  IMPRESSION: 1. Right paramedian disc protrusion and annular tear at L4-5 with potential impact on the traversing right L5 nerve roots. 2. Shallow central disc protrusion and annular tear at L5-S1 without focal stenosis.   Lumbar spine MRI is reviewed in person.  There is small disc protrusion at L4-L5 and L5-S1.  The protrusion at each level does not appear to cause significant compromise.     ADB PELVIC CT FINDINGS: Lower chest: Lung bases clear  Hepatobiliary: Gallbladder and liver normal appearance  Pancreas: Normal appearance  Spleen: Normal appearance  Adrenals/Urinary Tract: Minimal chronic thickening of LEFT adrenal gland without discrete mass. Kidneys and ureters normal appearance. Foley catheter decompresses urinary bladder. Bladder mass identified on the previous exam not visualized.  Stomach/Bowel: Appendix surgically absent by history. Stomach and bowel loops normal appearance  Vascular/Lymphatic: Atherosclerotic calcifications aorta and coronary arteries without aortic aneurysm. Few normal sized to mildly enlarged lymph nodes at the LEFT inguinal region.  Reproductive: Mild prostatic enlargement gland measuring 5.9 x 4.1 x 3.2 cm. Seminal vesicles unremarkable.  Other: Infiltrative changes at LEFT inguinal region, by history inguinal herniorrhaphy 08/15/2017. Small focal fluid collection at LEFT inguinal hernia repair 21 x 9 mm image 83 question seroma or lymphocele, infection considered  less likely this far removed from surgery though not completely excluded. No free intraperitoneal air or fluid.  Musculoskeletal: Unremarkable  IMPRESSION: Prostatic enlargement.  No acute intra-abdominal or intrapelvic abnormalities.  Postoperative changes of LEFT inguinal herniorrhaphy with reactive lymph nodes and a 21 x 9 mm fluid collection at the surgical bed favor seroma versus lymphocele as discussed above.     BRAIN MRI MRA  FINDINGS: MRI HEAD FINDINGS  Brain: Diffusion imaging does not show any acute or subacute infarction. The brain shows generalized atrophy, progressive since 2010. The study does not show a pattern of advanced small vessel ischemic change, with only a few small foci within the hemispheric white matter. There is some layering material dependent within the ventricles, probably some old blood products related to the falls. The differential diagnosis would be layering debris secondary to meningitis. No evidence of mass lesion. No extra-axial collection.  Vascular: Major vessels at the base of the brain show flow.  Skull and upper cervical spine: Negative  Sinuses/Orbits: Clear/normal  Other: None  MRA HEAD FINDINGS  Both internal carotid arteries are patent through the skull base and siphon regions. On the right, this vessel supplies only the middle cerebral artery. No MCA stenosis or occlusion. On the left, this vessel supplies the left middle cerebral artery and both anterior cerebral arteries. No stenosis, occlusion, aneurysm or high flow vascular malformation.  Both vertebral arteries are widely patent to the basilar. No basilar stenosis. Posterior circulation branch vessels appear normal.  IMPRESSION: Generalized brain atrophy, progressive since the CT scan of 2010. There are only minimal small vessel changes of the hemispheric white matter.  Some layering material in the occipital horns of lateral  ventricles, probably blood products related to recent falls described clinically. Differential diagnosis does include debris secondary to meningitis, but that seems less likely.  Negative intracranial MR angiography of the large and medium size vessels. 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      MRI  C AND T SPINE  FINDINGS: MRI CERVICAL SPINE FINDINGS  Alignment: Normal  Vertebrae: No fracture or primary bone lesion.  Cord: To cord compression or primary cord lesion.  Posterior Fossa, vertebral arteries, paraspinal tissues: Negative  Disc levels:  No abnormality at the foramen magnum, C1-2 or C2-3.  C3-4: Right-sided predominant spondylosis. Mild to moderate right foraminal narrowing. No compressive central canal stenosis.  C4-5 through C7-T1: Normal.  MRI thoracic SPINE FINDINGS  Alignment:  Normal  Vertebrae: No fracture or primary bone lesion. Ordinary small endplate Schmorl's nodes in the midthoracic region.  Cord: No evidence of primary cord lesion. No evidence of abnormal cord T2 signal. Ample subarachnoid space present dorsal to the cord. See below.  Paraspinal and other soft tissues:  Negative  Disc levels:  No abnormality at T6-7 or above.  T7-8: Central disc herniation effaces the ventral subarachnoid space and contacts the ventral cord. No foraminal extension. No cord compression.  T8-9: Small central disc herniation indents the ventral subarachnoid space and contacts the ventral cord. No foraminal extension. No cord compression.  T9-10: Right paracentral disc herniation effaces the ventral subarachnoid space on the right and contacts the right ventral cord. No foraminal extension. No cord compression.  T10-11: Central disc herniation with upward migration of disc material behind the T10 vertebral body. Effacement of the ventral subarachnoid  space with indentation of the cord more on the left. Ample subarachnoid space present dorsal to the cord. No foraminal extension.  T11-12: Normal.  T12-L1: Normal.  IMPRESSION: Cervical spine: No acute or traumatic finding. Right-sided spondylosis at C3-4 with mild to moderate right foraminal narrowing.  Thoracic spine: No acute or traumatic finding. Central disc herniations at T7-8 and T8-9 without likely neural compression. Central to right-sided disc herniation at T9-10 without likely neural compression. These could certainly be associated with back pain. T10-11 central disc herniation with upward migration behind T10 with some indentation of the left side of the cord. Ample subarachnoid space present dorsal to the cord, therefore cord compression is unlikely. This could also be associated with back pain however.      THE IMAGING ARE REVIEWED IN PERSON.  A brain MRI shows a tiny increased signal involving the right posterior aspect of the temporal horn on DWI.  This is not seen on the ADC scan.  Significance is unclear.  No other evidence of acute changes are noted.  There is moderate global atrophy more prominent than expected for age.  There is minimal white matter changes seen on FLAIR imaging.  No hemorrhages appreciated.  The cervical and the thoracic spine MRI is also reviewed.  The thoracic MRI is surprisingly under well min.  The thoracic MRI shows a large disc herniation posterior  At T10-T11 which encroach is the spinal cord but there is adequate room posteriorly.  There is a smaller disc at T7 T8.        Emalie Mcwethy A. Merlene Laughter, M.D.  Diplomate, Tax adviser of Psychiatry and Neurology ( Neurology). 11/17/2017, 7:30 PM

## 2017-11-17 NOTE — Progress Notes (Signed)
**Note De-Identified  Obfuscation** Patient removed from BIPAP and placed on [place], Tolerating well.  RRT to continue to monitor.

## 2017-11-17 NOTE — Progress Notes (Signed)
PROGRESS NOTE    Raymond [name]  RKY:[id number] DOB: 03/31/1956 DOA: 11/19/2017 PCP: Caren Macadam, MD   Brief Narrative:   This is a 62 year old male with recent TURP secondary to bladder tumor who presented to the hospital with generalized weakness, pelvic pain and numbness for past 3 weeks.  He has had significant urinary retention with recent Foley catheter placement.  He was noted to have significant generalized weakness with serum sodium of 122, related to poor PO intake, for which he was placed on IV normal saline with improvement into the normal range.  In speaking with his primary care provider Dr. Mannie Stabile, there was concern about some possible cauda equina and MRI of the lumbar spine was performed demonstrating some lower lumbar findings, but no cauda equina at the present. He continues to have weakness and is working with PT. He continues to have ongoing pain as well as significant weakness that cannot be attributed to hyponatremia at this point.  Family at the bedside is very concerned about the fact that he has not made any progress.  Therefore, neurology was consulted with further MRI imaging performed today with no major acute abnormalities noted; but suggesting disc protrusion at L5-S1 level.   Assessment & Plan:   Active Problems:   Hyponatremia   Protein-calorie malnutrition, severe   1. Generalized progressive weakness/with concerns for GBS.  This was initially thought to be related to hyponatremia secondary to poor oral intake, but this has improved with IV fluid administration.  Sodium levels have stayed in the 134-135 range at this time. IVF's discontinued currently.   Per neurology, concerns for GBS and empirically started on IVIG; antibodies testing pending. CK WNL. PT recommending SNF. MRI with some disc protrusion around L5-S1; will add neurontin and continue NSAID's therapy.  Continue holding statins and continue supportive care. After finishing treatment will add steroids  if needed for conservative management of disc protrusion. 2. Dysphagia/anorexia/protein calorie malnutrition moderate. Appreciate nutrition service and speech therapy recommendations. Will continue dysphagia 3 and follow feeding supplements. Patient instructed to increase PO intake. Nutritional service assisting with calorie count. 3. Pelvic floor pain-ongoing.  No major discomfort reported. Will continue tx with toradol and tylenol. No narcotics.  4. Urinary retention secondary to BPH.  Continue Foley catheter. Continue flomax. Outpatient follow up with urology recommended. 5. Constipation. Patient moved BM after enema on 2/16; no acute complaints currently.advise to increase PO intake and will continue to maximize physical activity. 6. Status post transurethral resection of bladder tumor.  Continue outpatient follow up by urology. Good urine output appreciated. Denies dysuria. 7. Diabetes.  Overall fair control. Will continue current insulin therapy.   DVT prophylaxis:Lovenox Code Status: Full code Family Communication: wife and daughter at bedside  Disposition Plan: PT recommending SNF. Will continue neurontin; start treatment with steroids on 2/22 for empiric herniated disc protrusion. Per neurology complete empiric therapy for GBS.   Consultants:   Neurology Dr. Merlene Laughter  Procedures:   None  Antimicrobials:   None   Subjective: Afebrile, no CP, no nausea, no vomiting and denying SOB. Use BIPAP overnight once again.  Objective: Vitals:   11/17/17 1600 11/17/17 1636 11/17/17 1700 11/17/17 2000  BP: 129/74  132/68   Pulse:      Resp: (!) 23 (!) 22 (!) 24   Temp:  98.9 F (37.2 C)  98.7 F (37.1 C)  TempSrc:  Oral    SpO2:      Weight:      Height:  Intake/Output Summary (Last 24 hours) at 11/17/2017 2023 Last data filed at 11/17/2017 1823 Gross per 24 hour  Intake -  Output 1150 ml  Net -1150 ml   Filed Weights   11/15/17 1528 11/16/17 0003 11/17/17 0500    Weight: 81 kg (178 lb 9.2 oz) 80.8 kg (178 lb 2.1 oz) 79 kg (174 lb 2.6 oz)    Examination: General exam: comfortable overall, no CP, no SOB. Reports being tired today and per family more somnolent than yesterday.  Respiratory system: CTA bilaterally, normal resp effort. Cardiovascular system: S1 and S2, no rubs, no gallops Gastrointestinal system: soft, NT, ND, positive BS. Central nervous system: patient remained oriented X3, CN intact. Extremities: no edema, no cyanosis. LE with 3/5 MS, pain in lower back and associated numbness/tingling sensation down his legs with rise leg test. Same response seen on 2/20 assessment. Skin: no open wounds appreciated. Patient with stage 1 pressure injury in his sacral region. First seen on 2/21.    Data Reviewed: I have personally reviewed following labs and imaging studies  CBC: Recent Labs  Lab 11/11/17 0449 11/12/17 0659 11/13/17 0648 11/14/17 0654 11/16/17 0554  WBC 10.0 10.7* 11.0* 11.5* 12.1*  HGB 13.3 13.1 12.7* 13.1 12.9*  HCT 38.7* 39.4 38.8* 39.3 39.0  MCV 83.6 85.3 85.7 85.6 86.1  PLT 311 311 292 289 378   Basic Metabolic Panel: Recent Labs  Lab 11/12/17 0659 11/13/17 0648 11/14/17 0654 11/15/17 0610 11/16/17 0554 11/17/17 0506  NA 132* 132* 135 134* 131*  --   K 4.0 4.2 4.7 4.7 4.3  --   CL 95* 97* 98* 99* 97*  --   CO2 25 25 25 26 23   --   GLUCOSE 117* 155* 149* 143* 204*  --   BUN 20 17 17 19  36*  --   CREATININE 0.79 0.79 0.73 0.82 0.77 0.70  CALCIUM 9.2 8.8* 9.3 9.1 9.3  --    GFR: Estimated Creatinine Clearance: 105.6 mL/min (by C-G formula based on SCr of 0.7 mg/dL).   Liver Function Tests: Recent Labs  Lab 11/11/17 0449  AST 23  ALT 27  ALKPHOS 57  BILITOT 1.0  PROT 6.6  ALBUMIN 3.5   Cardiac Enzymes: Recent Labs  Lab 11/15/17 2116  CKTOTAL 163  CKMB 2.9   CBG: Recent Labs  Lab 11/16/17 1637 11/16/17 2109 11/17/17 0852 11/17/17 1132 11/17/17 1639  GLUCAP 168* 176* 242* 252* 214*    Anemia Panel: Recent Labs    11/14/17 2153  VITAMINB12 902    Radiology Studies: No results found.  Scheduled Meds: . acetaminophen  650 mg Oral Q8H  . aspirin EC  81 mg Oral Daily  . enoxaparin (LOVENOX) injection  40 mg Subcutaneous Q24H  . feeding supplement (ENSURE ENLIVE)  237 mL Oral BID BM  . gabapentin  100 mg Oral BID  . Immune Globulin 10%  400 mg/kg Intravenous Q24H  . insulin aspart  0-20 Units Subcutaneous TID WC  . insulin aspart protamine- aspart  30 Units Subcutaneous BID WC  . irbesartan  300 mg Oral Daily  . ketorolac  30 mg Intravenous Q6H  . metoprolol succinate  25 mg Oral Daily  . tamsulosin  0.4 mg Oral QHS   Continuous Infusions:    LOS: 4 days    Time spent: 30 minutes    Barton Dubois, MD [facility] Pager [phone]  If 7PM-7AM, please contact night-coverage [email or web address] Password [facility] 11/17/2017, 8:23 PM

## 2017-11-18 LAB — BASIC METABOLIC PANEL
ANION GAP: 10 (ref 5–15)
BUN: 46 mg/dL — ABNORMAL HIGH (ref 6–20)
CALCIUM: 8.9 mg/dL (ref 8.9–10.3)
CO2: 26 mmol/L (ref 22–32)
CREATININE: 0.7 mg/dL (ref 0.61–1.24)
Chloride: 96 mmol/L — ABNORMAL LOW (ref 101–111)
Glucose, Bld: 282 mg/dL — ABNORMAL HIGH (ref 65–99)
Potassium: 4.5 mmol/L (ref 3.5–5.1)
Sodium: 132 mmol/L — ABNORMAL LOW (ref 135–145)

## 2017-11-18 LAB — GLUCOSE, CAPILLARY
GLUCOSE-CAPILLARY: 212 mg/dL — AB (ref 65–99)
GLUCOSE-CAPILLARY: 215 mg/dL — AB (ref 65–99)
Glucose-Capillary: 284 mg/dL — ABNORMAL HIGH (ref 65–99)
Glucose-Capillary: 197 mg/dL — ABNORMAL HIGH (ref 65–99)

## 2017-11-18 LAB — ANTINUCLEAR ANTIBODIES, IFA: ANA Ab, IFA: POSITIVE — AB

## 2017-11-18 LAB — FANA STAINING PATTERNS: Homogeneous Pattern: 1:160 {titer} — ABNORMAL HIGH

## 2017-11-18 MED ORDER — KETOROLAC TROMETHAMINE 30 MG/ML IJ SOLN
30.0000 mg | Freq: Three times a day (TID) | INTRAMUSCULAR | Status: DC | PRN
  Administered 2017-11-19: 30 mg via INTRAVENOUS
  Filled 2017-11-18: qty 1

## 2017-11-18 MED ORDER — INSULIN DETEMIR 100 UNIT/ML ~~LOC~~ SOLN
15.0000 [IU] | Freq: Two times a day (BID) | SUBCUTANEOUS | Status: DC
  Administered 2017-11-18 (×2): 15 [IU] via SUBCUTANEOUS
  Filled 2017-11-18 (×7): qty 0.15

## 2017-11-18 MED ORDER — INSULIN ASPART 100 UNIT/ML ~~LOC~~ SOLN: 3.0000 [IU] | Freq: Three times a day (TID) | SUBCUTANEOUS | Status: DC

## 2017-11-18 MED ORDER — METHYLPREDNISOLONE SODIUM SUCC 40 MG IJ SOLR
40.0000 mg | Freq: Two times a day (BID) | INTRAMUSCULAR | Status: DC
  Administered 2017-11-18 – 2017-11-27 (×18): 40 mg via INTRAVENOUS
  Filled 2017-11-18 (×20): qty 1

## 2017-11-18 MED ORDER — INSULIN ASPART PROT & ASPART (70-30 MIX) 100 UNIT/ML ~~LOC~~ SUSP
40.0000 [IU] | Freq: Two times a day (BID) | SUBCUTANEOUS | Status: DC
  Filled 2017-11-18: qty 10

## 2017-11-18 NOTE — Progress Notes (Signed)
Physical Therapy Treatment Patient Details Name: Raymond [name] MRN: [id number] DOB: Oct 30, 1955 Today's Date: 11/18/2017    History of Present Illness 62 y/o male PMHx Anemia, CAD, MI, DM2, HTN/HLD w/ recent TURP last July and August for bladder tumor. Presents with generalized pelvic pain & numbness/weakness of BLE x3 weeks. Reports loss of 35 lbs x3 months due to decreased appetite and abd pain/distention. Symptoms concerning for tumor recurrence pressing on spinal cord.    PT Comments    Pt being fed lunch by family upon first attempt, sister reporting that patient has been less able to use BUE for self feeding this session, pt much more lethargic today. PT returned 2 hours later, patient asleep in bed, sister in room. PT attempted to wake patient with limited success. Positioned pt upright in hopes to improve alertness, and then proceeded with bed level exercises after patient reported (with heavy dysarthria) that he was agreeable to participate. BUE movements are performed with greater ease, however all BLE movements require max-total assist, including intermittent limb withdrawal with onset of pain. Ultimately over 11 minutes of stimulation, and P/ROM of BLE, the patient was unable to remain alert sufficiently to continue to participate. Sister reports that the patient has been more lethargic and weak in appearance today. RN reports patient has been asleep much of day, difficult to awaken. PT will continue to follow.      Follow Up Recommendations  SNF;Supervision/Assistance - 24 hour     Equipment Recommendations  None recommended by PT    Recommendations for Other Services       Precautions / Restrictions Precautions Precautions: Fall Restrictions Weight Bearing Restrictions: No    Mobility  Bed Mobility                  Transfers                    Ambulation/Gait                 Stairs            Wheelchair Mobility    Modified Rankin  (Stroke Patients Only)       Balance                                            Cognition Arousal/Alertness: Lethargic Behavior During Therapy: WFL for tasks assessed/performed;Flat affect Overall Cognitive Status: Difficult to assess                                        Exercises Total Joint Exercises Ankle Circles/Pumps: PROM;20 reps;Seated Short Arc Quad: PROM;20 reps;Seated Heel Slides: PROM;20 reps;Seated    General Comments        Pertinent Vitals/Pain Pain Assessment: Faces Faces Pain Scale: Hurts even more Pain Location: wincing/moaning with Left ankle DF P/ROM and RLE Hip flexion.  Pain Intervention(s): Limited activity within patient's tolerance;Monitored during session    Home Living                      Prior Function            PT Goals (current goals can now be found in the care plan section) Acute Rehab PT Goals Patient Stated Goal: return home with PLOF PT  Goal Formulation: With patient/family Time For Goal Achievement: 11/25/17 Potential to Achieve Goals: Fair Progress towards PT goals: Not progressing toward goals - comment    Frequency    Min 3X/week      PT Plan Current plan remains appropriate    Co-evaluation              AM-PAC PT "6 Clicks" Daily Activity  Outcome Measure  Difficulty turning over in bed (including adjusting bedclothes, sheets and blankets)?: A Lot Difficulty moving from lying on back to sitting on the side of the bed? : A Lot Difficulty sitting down on and standing up from a chair with arms (e.g., wheelchair, bedside commode, etc,.)?: Unable Help needed moving to and from a bed to chair (including a wheelchair)?: Total Help needed walking in hospital room?: Total Help needed climbing 3-5 steps with a railing? : Total 6 Click Score: 8    End of Session   Activity Tolerance: Patient limited by fatigue;Patient tolerated treatment well Patient left: in  bed;with call bell/phone within reach;with family/visitor present;with bed alarm set Nurse Communication: Mobility status PT Visit Diagnosis: Unsteadiness on feet (R26.81);Difficulty in walking, not elsewhere classified (R26.2);Repeated falls (R29.6);Muscle weakness (generalized) (M62.81)     Time: 3343-5686 PT Time Calculation (min) (ACUTE ONLY): 11 min  Charges:  $Therapeutic Activity: 8-22 mins                    G Codes:       4:24 PM, November 26, 2017 Etta Grandchild, PT, DPT Physical Therapist - [place] [phone] (Office)   Braylon Lemmons C Nov 26, 2017, 4:19 PM

## 2017-11-18 NOTE — NC FL2 (Signed)
[place] LEVEL OF CARE SCREENING TOOL     IDENTIFICATION  Patient Name: Raymond [name] Birthdate: 04-22-1956 Sex: male Admission Date (Current Location): 10/31/2017  [place] and [place] Number:  [employer] and Address:  [facility] [address], [place]      Provider Number: [id number]  Attending Physician Name and Address:  Barton Dubois, MD  Relative Name and Phone Number:       Current Level of Care: Hospital Recommended Level of Care: [place] Prior Approval Number:    Date Approved/Denied:   PASRR Number: [id number]  Discharge Plan: SNF    Current Diagnoses: Patient Active Problem List   Diagnosis Date Noted  . Protein-calorie malnutrition, severe 11/12/2017  . Hyponatremia 11/16/2017  . Left inguinal hernia     Orientation RESPIRATION BLADDER Height & Weight     Self, Time, Situation, Place  O2 Continent Weight: 175 lb 7.8 oz (79.6 kg) Height:  6\' 1"  (185.4 cm)  BEHAVIORAL SYMPTOMS/MOOD NEUROLOGICAL BOWEL NUTRITION STATUS      Continent Diet(See d/c summary)  AMBULATORY STATUS COMMUNICATION OF NEEDS Skin   Extensive Assist   PU Stage and Appropriate Care(sacrum)                       Personal Care Assistance Level of Assistance  Bathing, Feeding, Dressing Bathing Assistance: Limited assistance Feeding assistance: Limited assistance Dressing Assistance: Limited assistance     Functional Limitations Info  Sight, Hearing, Speech Sight Info: Adequate Hearing Info: Adequate Speech Info: Adequate    SPECIAL CARE FACTORS FREQUENCY  PT (By licensed PT), Speech therapy     PT Frequency: 5x/week       Speech Therapy Frequency: 3x/week      Contractures Contractures Info: Not present    Additional Factors Info  Code Status, Allergies Code Status Info: Full code Allergies Info: NKA           Current Medications (11/18/2017):  This is the current hospital active  medication list Current Facility-Administered Medications  Medication Dose Route Frequency Provider Last Rate Last Dose  . acetaminophen (TYLENOL) tablet 650 mg  650 mg Oral Q8H Barton Dubois, MD   650 mg at 11/18/17 1500  . aspirin EC tablet 81 mg  81 mg Oral Daily Barton Dubois, MD   81 mg at 11/18/17 7096  . enoxaparin (LOVENOX) injection 40 mg  40 mg Subcutaneous Q24H Oswald Hillock, MD   40 mg at 11/17/17 2207  . feeding supplement (ENSURE ENLIVE) (ENSURE ENLIVE) liquid 237 mL  237 mL Oral BID BM Shah, Pratik D, DO   237 mL at 11/18/17 1500  . gabapentin (NEURONTIN) capsule 100 mg  100 mg Oral BID Barton Dubois, MD   100 mg at 11/18/17 2836  . Immune Globulin 10% (PRIVIGEN) IV infusion 30 g  400 mg/kg Intravenous Q24H Phillips Odor, MD   30 g at 11/17/17 1719  . insulin aspart (novoLOG) injection 0-20 Units  0-20 Units Subcutaneous TID WC Shah, Pratik D, DO   7 Units at 11/18/17 1246  . insulin aspart (novoLOG) injection 3 Units  3 Units Subcutaneous TID WC Barton Dubois, MD      . insulin detemir (LEVEMIR) injection 15 Units  15 Units Subcutaneous BID Barton Dubois, MD      . irbesartan (AVAPRO) tablet 300 mg  300 mg Oral Daily Oswald Hillock, MD   300 mg at 11/18/17 6294  .  ketorolac (TORADOL) 30 MG/ML injection 30 mg  30 mg Intravenous Q8H PRN Barton Dubois, MD      . methylPREDNISolone sodium succinate (SOLU-MEDROL) 40 mg/mL injection 40 mg  40 mg Intravenous Q12H Barton Dubois, MD   40 mg at 11/18/17 1245  . metoprolol succinate (TOPROL-XL) 24 hr tablet 25 mg  25 mg Oral Daily Gardiner Barefoot, NP   25 mg at 11/18/17 0906  . ondansetron (ZOFRAN) tablet 4 mg  4 mg Oral Q6H PRN Oswald Hillock, MD       Or  . ondansetron [facility]) injection 4 mg  4 mg Intravenous Q6H PRN Oswald Hillock, MD      . tamsulosin (FLOMAX) capsule 0.4 mg  0.4 mg Oral QHS Oswald Hillock, MD   0.4 mg at 11/17/17 2207  . traZODone (DESYREL) tablet 50 mg  50 mg Oral QHS PRN Oswald Hillock, MD   50 mg at  11/11/17 2136     Discharge Medications: Please see discharge summary for a list of discharge medications.  Relevant Imaging Results:  Relevant Lab Results:   Additional Information SSN [id number], Clydene Pugh, LCSW

## 2017-11-18 NOTE — Progress Notes (Signed)
Calorie Count Note  48 hr calorie Count has been conducted.   From Dinner 2/20- Lunch 2/22  Diet: HH, Dysphagia 3  Supplements: Ensure Enlive BID. Ice cream with meals  Unfortunately, there was minimal information noted on meal tickets, however thankfully, family has been in room consistently at are excellent historians with PO intake.   From their report, Frankly, there has been insufficient intake to even warrant a calorie count. Per family, intake is as follows:  Wednesday  Dinner: 2 small bites of meatloaf providing estimated 25-50 kcals and 1- 2 g Pro (pt reportedly choked on this and cough up small piece of ground beef) + 2 bites of Pinto Beans estimated 25-50 kcals and 1-2 g Pro)( family brought from home)                                                                      Thursday:  Only intake ENTIRE DAY was 1 cup of ice cream (100 kcals, 2 g Pro) and "maybe" 1/4 cup of pinto beans  estimated 60 kcals and 3 g Pro)(family brought from home.  He did not drink any of Ensures yesterday.                                                                          Friday Breakfast: 100% Oatmeal (140 kcals, 5 g Pro) and 100% 2% milk (120 kcals and 8 g Pro) Lunch: None of meal, approximately 25% of ice cream while RD present (30 kcal, .5g Pro). Pt had coughing with this Supplements: 25% of 1 Ensure. (90 kcals, 5 g Pro)   Assessment:  For the past 48 hrs, patient is estimated to have consumed an estimated 600-650 kcals and 25-27 g Pro.  Todays bed weight was 79.4 kg or ~174 lbs. This is a loss of approximately 4 lbs in the last 4 days. He was admitted at 179 lbs and family reports he had already had lost 30-35 lbs in the 1.5 months leading up to admission.  Also, while RD present, patient was noted to have frequent coughing episodes with ice cream. Family reports patient coughed up a piece of ground beef 2 night ago. Also, they note patient does worst with thin liquids, such as water, and  coughs frequently. They also report if he drinks water, he then wont eat food. These finding were relayed to ST.   Spoke with family about need for nutrition support given worsening malnutrition and finding of calorie count. They agree patient is not eating nearly enough and are in agreement if MD on board  RE Estimated needs:  2150-2400 kcals (27-30 kcal/kg bw) 103-119g Pro (1.3-1.5 g/kg bw) >2.4 L (30 ml/kg bw)  Nutrition Dx: Severe Malnutrition, worsening.   Goal: placement of NGT to meet >90% kcal/protein needs.   Intervention: HIGHLY RECOMMEND PLACEMENT OF NGT FOR NUTRITION SUPPORT. Patient with functional GI tract and  enteral nutritional is highly preferred over TPN in terms of outcomes.   Ovid Curd  Cecille Rubin RD, LDN, CNSC Clinical Nutrition Pager: [phone] 11/18/2017 1:21 PM

## 2017-11-18 NOTE — Progress Notes (Signed)
Inpatient Diabetes Program Recommendations  AACE/ADA: New Consensus Statement on Inpatient Glycemic Control (2015)  Target Ranges:  Prepandial:   less than 140 mg/dL      Peak postprandial:   less than 180 mg/dL (1-2 hours)      Critically ill patients:  140 - 180 mg/dL   Lab Results  Component Value Date   GLUCAP 212 (H) 11/18/2017   HGBA1C 9.1 (H) 11/12/2017    Review of Glycemic Control   Results for LANNY, LIPKIN (MRN [id number]) as of 11/18/2017 12:42  Ref. Range 11/17/2017 11:32 11/17/2017 16:39 11/17/2017 21:22 11/18/2017 08:03 11/18/2017 11:52  Glucose-Capillary Latest Ref Range: 65 - 99 mg/dL 252 (H) 214 (H) 238 (H) 284 (H) 212 (H)   Diabetes history: Type 2 DM  Outpatient Diabetes medications: Metformin 1,000 mg BID, Humalog 75/25 30-35 Units BID, Victoza 1.8 mg QD  Current orders for Inpatient glycemic control: Novolog 70/30 40 Units BID, Novolog 0-20 Units TIDAC, (40 units of 70/30= 28 units basal and 12 units bolus)  * MD increased 70/30 dose to 40 units bid to start today, solu-medrol started  Inpatient Diabetes Program Recommendations:  Patient has had very poor dietary intake and both doses of 70/30 insulin were held yesterday, contributing to the elevated CBG today. He continues to eat poorly and RN reports that RD is recommending an NG tube for delivering additional calories.   For a safer alternative, consider d/c 70/30 and ordering Levemir 14 units (1/2 home dose) bid and add Novolog 3 units tid (hold this if patient eats less than 50%). If the patient is going to start a feeding tube and is going to get it around the clock- the Novolog would need to be q4h and the correction (sliding scale) Novolog would be changed to q4h as well.    Text page to MD   Gentry Fitz, RN, BA, MHA, CDE Diabetes Coordinator Inpatient Diabetes Program  [phone] (Team Pager) [phone] ([place]) 11/18/2017 1:07 PM

## 2017-11-18 NOTE — Progress Notes (Addendum)
Did 1100 cc with cough. Nif did not do, do to sleeping . He is very hard to arouse

## 2017-11-18 NOTE — Progress Notes (Signed)
PROGRESS NOTE    Raymond [name]  ZMO:[id number] DOB: Mar 25, 1956 DOA: 11/11/2017 PCP: Caren Macadam, MD   Brief Narrative:   This is a 62 year old male with recent TURP secondary to bladder tumor who presented to the hospital with generalized weakness, pelvic pain and numbness for past 3 weeks.  He has had significant urinary retention with recent Foley catheter placement.  He was noted to have significant generalized weakness with serum sodium of 122, related to poor PO intake, for which he was placed on IV normal saline with improvement into the normal range.  In speaking with his primary care provider Dr. Mannie Stabile, there was concern about some possible cauda equina and MRI of the lumbar spine was performed demonstrating some lower lumbar findings, but no cauda equina at the present. He continues to have weakness and is working with PT. He continues to have ongoing pain as well as significant weakness that cannot be attributed to hyponatremia at this point.  Family at the bedside is very concerned about the fact that he has not made any progress.  Therefore, neurology was consulted with further MRI imaging performed today with no major acute abnormalities noted; but suggesting disc protrusion at L5-S1 level.   Assessment & Plan:   Active Problems:   Hyponatremia   Protein-calorie malnutrition, severe   1. Generalized progressive weakness/with concerns for GBS.  This was initially thought to be related to hyponatremia secondary to poor oral intake, but this has improved with IV fluid administration.  Sodium levels have stayed in the 134-135 range at this time. IVF's discontinued currently.   Per neurology, concerns for GBS and empirically started on IVIG; antibodies testing pending. CK WNL. PT recommending SNF. MRI with some disc protrusion around L5-S1; will cotninue neurontin and now use NSAID's just PRN.  Continue holding statins and continue supportive care. Will add steroids for conservative  management of disc protrusion and increase Physical activity. 2. Dysphagia/anorexia/protein calorie malnutrition (Severe). Appreciate nutrition service and speech therapy recommendations. Will continue dysphagia 3 and follow feeding supplements. Patient instructed to increase PO intake. Nutritional service assisting with calorie count. Will follow improvement and if needed discussed placement of NGT. 3. Pelvic floor pain-ongoing.  No major discomfort reported. Will continue tx with toradol PRN and schedule tylenol. No narcotics.  4. Urinary retention secondary to BPH.  Continue Foley catheter. Continue flomax. Outpatient follow up with urology recommended. 5. Constipation. Patient moved BM after enema on 2/16; no acute complaints currently.advise to increase PO intake and will continue to maximize physical activity. Follow ability to move bowels once activity increase, diet further advance and PRN laxatives added. 6. Status post transurethral resection of bladder tumor.  Continue outpatient follow up by urology. Good urine output appreciated. Denies dysuria. 7. Diabetes.  A1c 8.7.  Anticipating increasing his CBGs due to the use of his steroids.  7030 will be discontinued at this moment for a safer regimen while inpatient (will initiate Levemir, continue sliding scale insulin and use NovoLog for meal coverage).  Appreciate diabetes coordinator for recommendation.   DVT prophylaxis:Lovenox Code Status: Full code Family Communication: wife and daughter at bedside  Disposition Plan: We will transfer to MedSurg, complete IVIG empirical treatment for GBS and initiate conservative management for associated lumbar disc protrusion around his L4-L5 and L5-S1.  Continue maximizing physical activity.  Consultants:   Neurology Dr. Merlene Laughter  Procedures:   None  Antimicrobials:   None   Subjective: Afebrile, no chest pain, no nausea, no vomiting, denies any respiratory  distress.  No need for BiPAP  overnight.  Patient hemodynamically stable.  Objective: Vitals:   11/18/17 0500 11/18/17 0820 11/18/17 0900 11/18/17 1230  BP:   (!) 151/76   Pulse:   81   Resp:   19   Temp:  97.8 F (36.6 C)  (!) 97.5 F (36.4 C)  TempSrc:  Axillary  Axillary  SpO2:   100%   Weight: 79.6 kg (175 lb 7.8 oz)     Height:        Intake/Output Summary (Last 24 hours) at 11/18/2017 1523 Last data filed at 11/18/2017 0500 Gross per 24 hour  Intake -  Output 1300 ml  Net -1300 ml   Filed Weights   11/16/17 0003 11/17/17 0500 11/18/17 0500  Weight: 80.8 kg (178 lb 2.1 oz) 79 kg (174 lb 2.6 oz) 79.6 kg (175 lb 7.8 oz)    Examination: General exam: Afebrile, in no acute distress, no chest pain, no respiratory distress.  Family reports patient continued to be tired and with fluctuating somnolence.  Did not require the use of BiPAP and is 100% on 3 L nasal cannula.   Respiratory system: Clear to auscultation bilaterally, normal respiratory effort, no use of accessory muscles.   Cardiovascular system: S1 and S2, no rubs, no gallops. Gastrointestinal system: Soft, nontender, nondistended, positive bowel sounds. Central nervous system: Intact cranial nerves, except for his decreased muscular strength in the lower extremities bilaterally there is no really any focal deficit appreciated.  Overall demonstrating good insight and able to answer questions appropriately. Extremities: No edema, no cyanosis, no clubbing.  Patient lower extremity muscle strength 3 out of 5 bilaterally. Skin: Stage I pressure injury in his sacral region appreciated, no signs of superimposed infection.    Data Reviewed: I have personally reviewed following labs and imaging studies  CBC: Recent Labs  Lab 11/12/17 0659 11/13/17 0648 11/14/17 0654 11/16/17 0554  WBC 10.7* 11.0* 11.5* 12.1*  HGB 13.1 12.7* 13.1 12.9*  HCT 39.4 38.8* 39.3 39.0  MCV 85.3 85.7 85.6 86.1  PLT 311 292 289 458   Basic Metabolic Panel: Recent Labs    Lab 11/13/17 0648 11/14/17 0654 11/15/17 0610 11/16/17 0554 11/17/17 0506 11/18/17 0421  NA 132* 135 134* 131*  --  132*  K 4.2 4.7 4.7 4.3  --  4.5  CL 97* 98* 99* 97*  --  96*  CO2 25 25 26 23   --  26  GLUCOSE 155* 149* 143* 204*  --  282*  BUN 17 17 19  36*  --  46*  CREATININE 0.79 0.73 0.82 0.77 0.70 0.70  CALCIUM 8.8* 9.3 9.1 9.3  --  8.9   GFR: Estimated Creatinine Clearance: 106.4 mL/min (by C-G formula based on SCr of 0.7 mg/dL).   Cardiac Enzymes: Recent Labs  Lab 11/15/17 2116  CKTOTAL 163  CKMB 2.9   CBG: Recent Labs  Lab 11/17/17 1132 11/17/17 1639 11/17/17 2122 11/18/17 0803 11/18/17 1152  GLUCAP 252* 214* 238* 284* 212*   Radiology Studies: No results found.  Scheduled Meds: . acetaminophen  650 mg Oral Q8H  . aspirin EC  81 mg Oral Daily  . enoxaparin (LOVENOX) injection  40 mg Subcutaneous Q24H  . feeding supplement (ENSURE ENLIVE)  237 mL Oral BID BM  . gabapentin  100 mg Oral BID  . Immune Globulin 10%  400 mg/kg Intravenous Q24H  . insulin aspart  0-20 Units Subcutaneous TID WC  . insulin aspart  3 Units Subcutaneous TID  WC  . insulin detemir  15 Units Subcutaneous BID  . irbesartan  300 mg Oral Daily  . methylPREDNISolone (SOLU-MEDROL) injection  40 mg Intravenous Q12H  . metoprolol succinate  25 mg Oral Daily  . tamsulosin  0.4 mg Oral QHS   Continuous Infusions:    LOS: 5 days    Time spent: 30 minutes    Barton Dubois, MD [facility] Pager [phone]  If 7PM-7AM, please contact night-coverage [email or web address] Password [facility] 11/18/2017, 3:23 PM

## 2017-11-18 NOTE — Progress Notes (Signed)
[place] A. Merlene Laughter, MD     [email or web address]          Raymond [name] is an 62 y.o. male.   ASSESSMENT/PLAN: 1. Subacute leg weakness that is quite severe: The presentation is most consistent with Guillain-Barr syndrome. On immunoglobulins at the usual protocol 400 milligrams/kilogram for five days. He is 4/5 days today. We will continue to hold his statins, Plavix and the zetia. In particular concerned about delayed statin myopathy due to antibodies to HMG COA Reductase. We are trying to testing for this but is a send out and the quite difficult to obtain. His respiratory mechanics are stable. Nutrition is of very serious concern however as it appears he is not taking in enough to sustain recovery. Appreciate Lanier Prude input  (((Nutrition Dx: Severe Malnutrition, worsening.  Goal: placement of NGT to meet >90% kcal/protein needs.  Intervention: HIGHLY RECOMMEND PLACEMENT OF NGT FOR NUTRITION SUPPORT. Patient with functional GI tract and  enteral nutritional is highly preferred over TPN in terms of outcomes.  ))))))))))))))))))).   He needs a PEG. Will continue with BiPAP for now.    2. Unexpected and unexplained encephalopathy: Reduce medications as above and minimize psychotropic medications.   Again very poor  PO intake. Sleepy today per family.     GENERAL: Slightly laboured breathing.  HEENT:  Neck is supple and no evidence of trauma  EXTREMITIES: No edema; significant pain on palpation of the upper extremities.   BACK: Normal  SKIN: Normal by inspection.    MENTAL STATUS: Sleeping but awakes to commands. Follows commands.   CRANIAL NERVES: Pupils are equal, round and reactive to light and accomodation; extra ocular movements are full, there is no significant nystagmus; visual fields are full; upper and lower facial muscles are normal in strength and symmetric, there is no flattening of the nasolabial folds; tongue is midline; uvula is midline;  shoulder elevation is normal.  MOTOR: Left deltoid 3/5 and right deltoid 2/5. Right hip flexion is 3/5; Left hip flexion is 3/5.  COORDINATION: Left finger to nose is normal, right finger to nose is normal, No rest tremor; no intention tremor; no postural tremor; no bradykinesia.  REFLEXES: He is areflexic in the legs.  SENSATION: He responds to painful stimuli in the legs.  NIF -40 AND VC 3 L -- Stable      Blood pressure (!) 163/80, pulse 86, temperature 97.8 F (36.6 C), temperature source Axillary, resp. rate (!) 24, height '6\' 1"'  (1.854 m), weight 175 lb 7.8 oz (79.6 kg), SpO2 98 %.  Past Medical History:  Diagnosis Date  . Anemia   . Arthritis    Hands and left knee  . Coronary artery disease   . Diabetes mellitus without complication ([place])   . High cholesterol   . Hypertension   . MI (myocardial infarction) ([place]) 2009    Past Surgical History:  Procedure Laterality Date  . APPENDECTOMY    . CARDIAC CATHETERIZATION    . COLONOSCOPY    . COLONOSCOPY N/A 04/01/2017   Procedure: COLONOSCOPY;  Surgeon: Daneil Dolin, MD;  Location: [facility];  Service: Endoscopy;  Laterality: N/A;  9:45 AM  . CORONARY STENT PLACEMENT  2009   x3  . CYSTOSCOPY N/A 05/16/2017   Procedure: CYSTOSCOPY;  Surgeon: Cleon Gustin, MD;  Location: [facility];  Service: Urology;  Laterality: N/A;  . CYSTOSCOPY W/ URETERAL STENT PLACEMENT Bilateral 04/11/2017   Procedure: CYSTOSCOPY WITH RETROGRADE PYELOGRAM;  Surgeon: Nicolette Bang  L, MD;  Location: [facility];  Service: Urology;  Laterality: Bilateral;  . FRACTURE SURGERY Right    Steel Rod in femur  . INGUINAL HERNIA REPAIR Left 08/15/2017   Procedure: HERNIA REPAIR INGUINAL ADULT WITH MESH;  Surgeon: Aviva Signs, MD;  Location: [facility];  Service: General;  Laterality: Left;  . KNEE ARTHROSCOPY Left   . TONSILLECTOMY    . TRANSURETHRAL RESECTION OF BLADDER TUMOR N/A 04/11/2017   Procedure: TRANSURETHRAL RESECTION OF BLADDER TUMOR (TURBT);   Surgeon: Cleon Gustin, MD;  Location: [facility];  Service: Urology;  Laterality: N/A;  1 [id number] [id number]  . TRANSURETHRAL RESECTION OF BLADDER TUMOR N/A 05/16/2017   Procedure: TRANSURETHRAL RESECTION OF BLADDER TUMOR (TURBT);  Surgeon: Cleon Gustin, MD;  Location: [facility];  Service: Urology;  Laterality: N/A;    Family History  Problem Relation Age of Onset  . Diabetes Mother   . Heart attack Mother   . Cancer Father        lung  . Stroke Paternal Grandfather   . Colon cancer Paternal Uncle     Social History:  reports that  has never smoked. he has never used smokeless tobacco. He reports that he does not drink alcohol or use drugs.  Allergies: No Known Allergies  Medications: Prior to Admission medications   Medication Sig Start Date End Date Taking? Authorizing Provider  aspirin 325 MG tablet Take 325 mg by mouth at bedtime.    Yes [provider]  celecoxib (CELEBREX) 200 MG capsule Take 200 mg by mouth daily as needed for mild pain.    Yes [provider]  Choline Fenofibrate (FENOFIBRIC ACID) 135 MG CPDR Take 135 mg by mouth at bedtime.    Yes [provider]  CINNAMON PO Take 1,000 mg by mouth at bedtime.    Yes [provider]  clopidogrel (PLAVIX) 75 MG tablet TAKE 1 TABLET DAILY Patient taking differently: TAKE 1 TABLET AT BEDTIME 02/23/13  Yes Lorretta Harp, MD  Coenzyme Q10 (COQ10) 100 MG CAPS Take 100 mg by mouth at bedtime.   Yes [provider]  ezetimibe (ZETIA) 10 MG tablet Take 10 mg by mouth at bedtime.    Yes [provider]  ibuprofen (ADVIL,MOTRIN) 200 MG tablet Take 400 mg every 8 (eight) hours as needed by mouth for mild pain.    Yes [provider]  insulin lispro protamine-lispro (HUMALOG 75/25 MIX) (75-25) 100 UNIT/ML SUSP injection Inject 30-35 Units into the skin 2 (two) times daily with a meal. Patient takes 35 units in the morning and 30 units at night    Yes [provider]  metFORMIN (GLUCOPHAGE) 500 MG tablet Take 1,000 mg by mouth 2 (two) times daily with a meal. 03/09/17  Yes [provider]  metoprolol succinate (TOPROL-XL) 25 MG 24 hr tablet Take 25 mg by mouth at bedtime. 02/08/17  Yes [provider]  Multiple Vitamin (MULTIVITAMIN) tablet Take 1 tablet by mouth daily.   Yes [provider]  naproxen sodium (ANAPROX) 220 MG tablet Take 440 mg by mouth daily as needed (pain).   Yes [provider]  nitroGLYCERIN (NITROSTAT) 0.4 MG SL tablet Place 0.4 mg every 5 (five) minutes as needed under the tongue for chest pain.   Yes [provider]  Omega-3 Fatty Acids (FISH OIL) 1200 MG CAPS Take 1,200 mg by mouth at bedtime.    Yes [provider]  rosuvastatin (CRESTOR) 40 MG tablet Take 40 mg  by mouth at bedtime.    Yes [provider]  sulfamethoxazole-trimethoprim (BACTRIM DS,SEPTRA DS) 800-160 MG tablet Take 1 tablet by mouth 2 (two) times daily.  11/02/17  Yes [provider]  tamsulosin (FLOMAX) 0.4 MG CAPS capsule Take 0.4 mg by mouth at bedtime.   Yes [provider]  telmisartan (MICARDIS) 80 MG tablet Take 80 mg by mouth at bedtime.    Yes [provider]  traMADol (ULTRAM) 50 MG tablet Take 50 mg by mouth every 8 (eight) hours as needed for moderate pain.  11/07/17  Yes [provider]  traZODone (DESYREL) 100 MG tablet Take 50-100 mg by mouth at bedtime as needed.  11/07/17  Yes [provider]  VICTOZA 18 MG/3ML SOPN Inject 1.8 mg into the skin daily. 03/03/17  Yes [provider]    Scheduled Meds: . acetaminophen  650 mg Oral Q8H  . aspirin EC  81 mg Oral Daily  . enoxaparin (LOVENOX) injection  40 mg Subcutaneous Q24H  . feeding supplement (ENSURE ENLIVE)  237 mL Oral BID BM  . gabapentin  100 mg Oral BID  . Immune Globulin 10%  400 mg/kg Intravenous Q24H  . insulin aspart  0-20 Units Subcutaneous TID WC  .  insulin aspart  3 Units Subcutaneous TID WC  . insulin detemir  15 Units Subcutaneous BID  . irbesartan  300 mg Oral Daily  . methylPREDNISolone (SOLU-MEDROL) injection  40 mg Intravenous Q12H  . metoprolol succinate  25 mg Oral Daily  . tamsulosin  0.4 mg Oral QHS   Continuous Infusions:  PRN Meds:.ketorolac, ondansetron **OR** ondansetron (ZOFRAN) IV, traZODone     Results for orders placed or performed during the hospital encounter of 11/23/2017 (from the past 48 hour(s))  Glucose, capillary     Status: Abnormal   Collection Time: 11/16/17  9:09 PM  Result Value Ref Range   Glucose-Capillary 176 (H) 65 - 99 mg/dL   Comment 1 Notify RN   Creatinine, serum     Status: None   Collection Time: 11/17/17  5:06 AM  Result Value Ref Range   Creatinine, Ser 0.70 0.61 - 1.24 mg/dL   GFR calc non Af Amer >60 >60 mL/min   GFR calc Af Amer >60 >60 mL/min    Comment: (NOTE) The eGFR has been calculated using the CKD EPI equation. This calculation has not been validated in all clinical situations. eGFR's persistently <60 mL/min signify possible Chronic Kidney Disease. Performed at [facility], [address] [place], [place] [address]   Glucose, capillary     Status: Abnormal   Collection Time: 11/17/17  8:52 AM  Result Value Ref Range   Glucose-Capillary 242 (H) 65 - 99 mg/dL  Glucose, capillary     Status: Abnormal   Collection Time: 11/17/17 11:32 AM  Result Value Ref Range   Glucose-Capillary 252 (H) 65 - 99 mg/dL  Glucose, capillary     Status: Abnormal   Collection Time: 11/17/17  4:39 PM  Result Value Ref Range   Glucose-Capillary 214 (H) 65 - 99 mg/dL  Glucose, capillary     Status: Abnormal   Collection Time: 11/17/17  9:22 PM  Result Value Ref Range   Glucose-Capillary 238 (H) 65 - 99 mg/dL   Comment 1 Notify RN   Basic metabolic panel     Status: Abnormal   Collection Time: 11/18/17  4:21 AM  Result Value Ref Range   Sodium 132 (L) 135 - 145 mmol/L  Potassium 4.5  3.5 - 5.1 mmol/L   Chloride 96 (L) 101 - 111 mmol/L   CO2 26 22 - 32 mmol/L   Glucose, Bld 282 (H) 65 - 99 mg/dL   BUN 46 (H) 6 - 20 mg/dL   Creatinine, Ser 0.70 0.61 - 1.24 mg/dL   Calcium 8.9 8.9 - 10.3 mg/dL   GFR calc non Af Amer >60 >60 mL/min   GFR calc Af Amer >60 >60 mL/min    Comment: (NOTE) The eGFR has been calculated using the CKD EPI equation. This calculation has not been validated in all clinical situations. eGFR's persistently <60 mL/min signify possible Chronic Kidney Disease.    Anion gap 10 5 - 15    Comment: Performed at [facility], [address], [place], [place] [address]  Glucose, capillary     Status: Abnormal   Collection Time: 11/18/17  8:03 AM  Result Value Ref Range   Glucose-Capillary 284 (H) 65 - 99 mg/dL   Comment 1 Notify RN    Comment 2 Document in Chart   Glucose, capillary     Status: Abnormal   Collection Time: 11/18/17 11:52 AM  Result Value Ref Range   Glucose-Capillary 212 (H) 65 - 99 mg/dL   Comment 1 Notify RN    Comment 2 Document in Chart   Glucose, capillary     Status: Abnormal   Collection Time: 11/18/17  5:42 PM  Result Value Ref Range   Glucose-Capillary 215 (H) 65 - 99 mg/dL   Comment 1 Notify RN    Comment 2 Document in Chart     Studies/Results:  L SPINE MRI FINDINGS: Segmentation: 5 non rib-bearing lumbar type vertebral bodies are present.  Alignment:  AP alignment is anatomic.  Vertebrae:  Marrow signal and vertebral body heights are normal.  Conus medullaris and cauda equina: Conus extends to the L1-2 level. Conus and cauda equina appear normal.  Paraspinal and other soft tissues: Limited imaging of the abdomen is unremarkable. There is no significant adenopathy.  Disc levels:  L1-2: Negative.  L2-3: Negative.  L3-4: Mild facet hypertrophy is present bilaterally. No significant disc protrusion or stenosis is present.  L4-5: A right paramedian disc protrusion and annular tear is present.  There is potential impact on the traversing right L5 nerve roots. The foramina are patent.  L5-S1: A shallow central disc protrusion and annular tear is present without focal stenosis. The foramina are patent.  IMPRESSION: 1. Right paramedian disc protrusion and annular tear at L4-5 with potential impact on the traversing right L5 nerve roots. 2. Shallow central disc protrusion and annular tear at L5-S1 without focal stenosis.   Lumbar spine MRI is reviewed in person.  There is small disc protrusion at L4-L5 and L5-S1.  The protrusion at each level does not appear to cause significant compromise.     ADB PELVIC CT FINDINGS: Lower chest: Lung bases clear  Hepatobiliary: Gallbladder and liver normal appearance  Pancreas: Normal appearance  Spleen: Normal appearance  Adrenals/Urinary Tract: Minimal chronic thickening of LEFT adrenal gland without discrete mass. Kidneys and ureters normal appearance. Foley catheter decompresses urinary bladder. Bladder mass identified on the previous exam not visualized.  Stomach/Bowel: Appendix surgically absent by history. Stomach and bowel loops normal appearance  Vascular/Lymphatic: Atherosclerotic calcifications aorta and coronary arteries without aortic aneurysm. Few normal sized to mildly enlarged lymph nodes at the LEFT inguinal region.  Reproductive: Mild prostatic enlargement gland measuring 5.9 x 4.1 x 3.2 cm. Seminal vesicles unremarkable.  Other: Infiltrative changes at LEFT inguinal region, by history inguinal herniorrhaphy 08/15/2017. Small focal fluid collection at LEFT inguinal hernia repair 21 x 9 mm image 83 question seroma or lymphocele, infection considered less likely this far removed from surgery though not completely excluded. No free intraperitoneal air or fluid.  Musculoskeletal: Unremarkable  IMPRESSION: Prostatic enlargement.  No acute intra-abdominal or intrapelvic  abnormalities.  Postoperative changes of LEFT inguinal herniorrhaphy with reactive lymph nodes and a 21 x 9 mm fluid collection at the surgical bed favor seroma versus lymphocele as discussed above.     BRAIN MRI MRA  FINDINGS: MRI HEAD FINDINGS  Brain: Diffusion imaging does not show any acute or subacute infarction. The brain shows generalized atrophy, progressive since 2010. The study does not show a pattern of advanced small vessel ischemic change, with only a few small foci within the hemispheric white matter. There is some layering material dependent within the ventricles, probably some old blood products related to the falls. The differential diagnosis would be layering debris secondary to meningitis. No evidence of mass lesion. No extra-axial collection.  Vascular: Major vessels at the base of the brain show flow.  Skull and upper cervical spine: Negative  Sinuses/Orbits: Clear/normal  Other: None  MRA HEAD FINDINGS  Both internal carotid arteries are patent through the skull base and siphon regions. On the right, this vessel supplies only the middle cerebral artery. No MCA stenosis or occlusion. On the left, this vessel supplies the left middle cerebral artery and both anterior cerebral arteries. No stenosis, occlusion, aneurysm or high flow vascular malformation.  Both vertebral arteries are widely patent to the basilar. No basilar stenosis. Posterior circulation branch vessels appear normal.  IMPRESSION: Generalized brain atrophy, progressive since the CT scan of 2010. There are only minimal small vessel changes of the hemispheric white matter.  Some layering material in the occipital horns of lateral ventricles, probably blood products related to recent falls described clinically. Differential diagnosis does include debris secondary to meningitis, but that seems less likely.  Negative intracranial MR angiography of the large and medium  size vessels. 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      MRI  C AND T SPINE  FINDINGS: MRI CERVICAL SPINE FINDINGS  Alignment: Normal  Vertebrae: No fracture or primary bone lesion.  Cord: To cord compression or primary cord lesion.  Posterior Fossa, vertebral arteries, paraspinal tissues: Negative  Disc levels:  No abnormality at the foramen magnum, C1-2 or C2-3.  C3-4: Right-sided predominant spondylosis. Mild to moderate right foraminal narrowing. No compressive central canal stenosis.  C4-5 through C7-T1: Normal.  MRI thoracic SPINE FINDINGS  Alignment:  Normal  Vertebrae: No fracture or primary bone lesion. Ordinary small endplate Schmorl's nodes in the midthoracic region.  Cord: No evidence of primary cord lesion. No evidence of abnormal cord T2 signal. Ample subarachnoid space present dorsal to the cord. See below.  Paraspinal and other soft tissues: Negative  Disc levels:  No abnormality at T6-7 or above.  T7-8: Central disc herniation effaces the ventral subarachnoid space and contacts the ventral cord. No foraminal extension. No cord compression.  T8-9: Small central disc herniation indents the ventral subarachnoid space and contacts the ventral cord. No foraminal extension. No cord compression.  T9-10: Right paracentral disc herniation effaces the ventral subarachnoid space on the right and contacts the right ventral cord. No foraminal extension. No cord compression.  T10-11: Central disc herniation with upward migration of  disc material behind the T10 vertebral body. Effacement of the ventral subarachnoid space with indentation of the cord more on the left. Ample subarachnoid space present dorsal to the cord. No foraminal extension.  T11-12: Normal.  T12-L1: Normal.  IMPRESSION: Cervical spine: No acute or traumatic finding.  Right-sided spondylosis at C3-4 with mild to moderate right foraminal narrowing.  Thoracic spine: No acute or traumatic finding. Central disc herniations at T7-8 and T8-9 without likely neural compression. Central to right-sided disc herniation at T9-10 without likely neural compression. These could certainly be associated with back pain. T10-11 central disc herniation with upward migration behind T10 with some indentation of the left side of the cord. Ample subarachnoid space present dorsal to the cord, therefore cord compression is unlikely. This could also be associated with back pain however.      THE IMAGING ARE REVIEWED IN PERSON.  A brain MRI shows a tiny increased signal involving the right posterior aspect of the temporal horn on DWI.  This is not seen on the ADC scan.  Significance is unclear.  No other evidence of acute changes are noted.  There is moderate global atrophy more prominent than expected for age.  There is minimal white matter changes seen on FLAIR imaging.  No hemorrhages appreciated.  The cervical and the thoracic spine MRI is also reviewed.  The thoracic MRI is surprisingly under well min.  The thoracic MRI shows a large disc herniation posterior  At T10-T11 which encroach is the spinal cord but there is adequate room posteriorly.  There is a smaller disc at T7 T8.        Michaella Imai A. Merlene Laughter, M.D.  Diplomate, Tax adviser of Psychiatry and Neurology ( Neurology). 11/18/2017, 7:57 PM

## 2017-11-18 NOTE — Progress Notes (Signed)
NIF was -45+  VC was 1800+ Good patient effort noted.

## 2017-11-19 DIAGNOSIS — L8990 Pressure ulcer of unspecified site, unspecified stage: Secondary | ICD-10-CM

## 2017-11-19 LAB — MRSA PCR SCREENING: MRSA by PCR: NEGATIVE

## 2017-11-19 LAB — BLOOD GAS, ARTERIAL
Acid-Base Excess: 6.3 mmol/L — ABNORMAL HIGH (ref 0.0–2.0)
Bicarbonate: 29.9 mmol/L — ABNORMAL HIGH (ref 20.0–28.0)
DRAWN BY: 23534
O2 Content: 2 L/min
O2 SAT: 94.2 %
PATIENT TEMPERATURE: 37
PCO2 ART: 43.4 mmHg (ref 32.0–48.0)
PO2 ART: 71.2 mmHg — AB (ref 83.0–108.0)
pH, Arterial: 7.458 — ABNORMAL HIGH (ref 7.350–7.450)

## 2017-11-19 LAB — GLUCOSE, CAPILLARY
GLUCOSE-CAPILLARY: 292 mg/dL — AB (ref 65–99)
GLUCOSE-CAPILLARY: 222 mg/dL — AB (ref 65–99)
GLUCOSE-CAPILLARY: 305 mg/dL — AB (ref 65–99)

## 2017-11-19 MED ORDER — INSULIN DETEMIR 100 UNIT/ML ~~LOC~~ SOLN
18.0000 [IU] | Freq: Two times a day (BID) | SUBCUTANEOUS | Status: DC
  Administered 2017-11-19 – 2017-11-24 (×11): 18 [IU] via SUBCUTANEOUS
  Filled 2017-11-19 (×14): qty 0.18

## 2017-11-19 MED ORDER — GABAPENTIN 100 MG PO CAPS: 100.0000 mg | ORAL_CAPSULE | Freq: Every day | ORAL | Status: DC

## 2017-11-19 MED ORDER — BISACODYL 10 MG RE SUPP: 10.0000 mg | Freq: Every day | RECTAL | Status: DC | PRN

## 2017-11-19 NOTE — Progress Notes (Signed)
  Speech Language Pathology Treatment: Dysphagia  Patient Details Name: Raymond [name] MRN: [id number] DOB: 1955/10/22 Today's Date: 11/19/2017 Time: 1430-1500 SLP Time Calculation (min) (ACUTE ONLY): 30 min  Assessment / Plan / Recommendation Clinical Impression  Pt seen for ongoing diagnostic dysphagia intervention. RD reported poor intake and some signs of aspiration yesterday. Pt's wife and sister were at bedside feeding Pt homemade soup of which Pt took a minimal amount. Pt is more lethargic today than when seen previously, although he is responsive and attempts to continue eating. Pt with some oral holding, suspected delay in swallow initiation, multiple swallows elicited for single bolus, and delayed cough at times. Pt consumed half of a Magic Cup with SLP, although it took a while. Do not anticipate that Pt will be able to meet current nutritional needs by po alone especially with fluctuating alertness. Strongly recommend consideration of alternative means of nutrition to supplement oral intake (small bore feeding tube) especially if this is GBS. Pt appears willing to take po, but also exhausted and with increased RR. Will downgrade to D2/chopped and continue thin and offer Magic Cups on tray.   HPI HPI: GaryHandyis a61 y.o.male,with history of diabetes mellitus, CAD, status post transurethral resection of bladder tumor came to hospital with complaints of generalized weakness,pelvic pain and numbness for past three weeks. As per patient,he was seen by urologist yesterday and Foley catheter was inserted for urinary retention. Status post left hernia repair with mesh on 08/11/17 by Dr. Aviva Signs. He denies nausea vomiting or diarrhea. Denies abdominal pain. No chest pain or shortness of breath. Also complains of insomniafor past 3 to 4 weeks due to pain. Was prescribed trazodone withonly minimal relief. In the ED CT scan of the abdomen was done which showed only prostate enlargement lab  work showed sodium 122. Reports loss of 35 lbs x3 months due to decreased appetite and abd pain/distention.       SLP Plan  Continue with current plan of care       Recommendations  Diet recommendations: Dysphagia 2 (fine chop);Thin liquid Liquids provided via: Cup;Straw Medication Administration: Whole meds with puree Supervision: Patient able to self feed;Full supervision/cueing for compensatory strategies Compensations: Minimize environmental distractions;Small sips/bites;Multiple dry swallows after each bite/sip Postural Changes and/or Swallow Maneuvers: Seated upright 90 degrees;Upright 30-60 min after meal                Oral Care Recommendations: Oral care BID;Staff/trained caregiver to provide oral care Follow up Recommendations: Skilled Nursing facility SLP Visit Diagnosis: Dysphagia, oropharyngeal phase (R13.12) Plan: Continue with current plan of care       Thank you,  Genene Churn, [place]                 [place] 11/19/2017, 3:12 PM

## 2017-11-19 NOTE — Progress Notes (Signed)
Pt desatted to 34 at about 5pm shortly after family fed patient homemade soup from home. Family stated he started coughing shortly after. Patient didn't seem to be in any obvious distress but he has started breathing heavier today in general. RT at bedside and increased [place] oxygen to 2L to 6L pt is maintaining between 88-92%. Family educated about diet and feeding and the possibilities of aspiration. MD will be notified.  Vere Diantonio Rica Mote, RN

## 2017-11-19 NOTE — Progress Notes (Signed)
Placed patient on BiPAP with 9 liter oxygen bled in. 14/6. His respirations are 32. He is still very lethargic. Has congested cough. Breath sounds are decreased may have aspirated to day as reported by nurse>

## 2017-11-19 NOTE — Progress Notes (Signed)
Respiratory Care Note: NIF: -58; FVC:  .9 Liters

## 2017-11-19 NOTE — Progress Notes (Signed)
PROGRESS NOTE    Raymond [name]  NOM:[id number] DOB: [birth year] DOA: 11/14/2017 PCP: Caren Macadam, MD   Brief Narrative:   This is a 62 year old male with recent TURP secondary to bladder tumor who presented to the hospital with generalized weakness, pelvic pain and numbness for past 3 weeks.  He has had significant urinary retention with recent Foley catheter placement.  He was noted to have significant generalized weakness with serum sodium of 122, related to poor PO intake, for which he was placed on IV normal saline with improvement into the normal range.  In speaking with his primary care provider Dr. Mannie Stabile, there was concern about some possible cauda equina and MRI of the lumbar spine was performed demonstrating some lower lumbar findings, but no cauda equina at the present. He continues to have weakness and is working with PT. He continues to have ongoing pain as well as significant weakness that cannot be attributed to hyponatremia at this point.  Family at the bedside is very concerned about the fact that he has not made any progress.  Therefore, neurology was consulted with further MRI imaging performed today with no major acute abnormalities noted; but suggesting disc protrusion at L5-S1 level.   Assessment & Plan:   Active Problems:   Hyponatremia   Protein-calorie malnutrition, severe   Pressure injury of skin   1. Generalized progressive weakness/with concerns for GBS.  This was initially thought to be related to hyponatremia secondary to poor oral intake, but this has improved with IV fluid administration.  Sodium levels have stayed in the 134-135 range at this time. IVF's discontinued currently.   Per neurology, concerns for GBS and empirically started on IVIG; antibodies testing pending. CK WNL. PT recommending SNF. MRI with some disc protrusion around L5-S1; will cotninue neurontin and now use NSAID's just PRN.  Continue holding statins and continue supportive care. Will continue  added steroids for conservative management of disc protrusion and increase Physical activity. 2. Dysphagia/anorexia/protein calorie malnutrition (Severe). Appreciate nutrition service and speech therapy recommendations. Will continue dysphagia 3 and follow feeding supplements. Patient instructed to increase PO intake. Discussed with wife and will like to hold on placing NGT at this moment. Will monitor. Appreciate rec's by nutritional service.  3. Pelvic floor pain-ongoing.  No major discomfort reported. Will continue tx with toradol PRN and schedule tylenol. No narcotics. Follow clinical response. 4. Urinary retention secondary to BPH.  Continue Foley catheter. Continue flomax. Outpatient follow up with urology recommended. 5. Constipation. Patient moved BM after enema on 2/16; no acute complaints currently.advise to increase PO intake and will continue to maximize physical activity. Follow ability to move bowels once activity increase, diet further advance and PRN laxatives added. 6. Status post transurethral resection of bladder tumor.  Continue outpatient follow up by urology. Good urine output appreciated. Denies dysuria. 7. Diabetes.  A1c 8.7.  Anticipating increasing his CBGs due to the use of his steroids.  70/30 discontinued on 2/22 for a safer regimen while inpatient and using steroids. Continue levemir and SSI. Appreciate diabetes coordinator for recommendation. 8. Lethargy: will check ABG. Neurontin changed to daily. Trazodone discontinue.   DVT prophylaxis:Lovenox Code Status: Full code Family Communication: wife at bedside  Disposition Plan: Complete IVIG empirical treatment for GBS and continue steroids as part of conservative management for associated lumbar disc protrusion around his L4-L5 and L5-S1.  Continue maximizing physical activity.  Consultants:   Neurology Dr. Merlene Laughter  Procedures:   None  Antimicrobials:   None  Subjective: Hemodynamically stable and afebrile.  No CP, no work of breathing. Difficult to arouse, but once awake appropriate.   Objective: Vitals:   11/19/17 0400 11/19/17 0500 11/19/17 0600 11/19/17 0800  BP:      Pulse: 89 90 88   Resp: (!) 23 (!) 23 (!) 24   Temp: 99.3 F (37.4 C)   98.2 F (36.8 C)  TempSrc:    Oral  SpO2: 95% 95% 95%   Weight: 76.8 kg (169 lb 5 oz)     Height:        Intake/Output Summary (Last 24 hours) at 11/19/2017 1036 Last data filed at 11/18/2017 2302 Gross per 24 hour  Intake 150 ml  Output 1000 ml  Net -850 ml   Filed Weights   11/17/17 0500 11/18/17 0500 11/19/17 0400  Weight: 79 kg (174 lb 2.6 oz) 79.6 kg (175 lb 7.8 oz) 76.8 kg (169 lb 5 oz)    Examination: General exam: no fever. Continue to have poor intake and has lost almost 10 more pounds since admission. Patient is somnolent and difficult to arouse. Using 3L [place]; O2 sat 96% Respiratory system: no wheezing, no rhonchi, no crackles. No using accessory muscles.  Cardiovascular system: S1 and S2, no rubs, no gallops Gastrointestinal system: soft, NT, ND, positive BS Central nervous system: except for unchanged weakness in Le no other focal deficit. Patient difficult to arouse, but appropriate once awake.  Extremities: no edema, no cyanosis, no clubbing  Skin: unchanged stage 1 pressure injury in his sacral area.    Data Reviewed: I have personally reviewed following labs and imaging studies  CBC: Recent Labs  Lab 11/13/17 0648 11/14/17 0654 11/16/17 0554  WBC 11.0* 11.5* 12.1*  HGB 12.7* 13.1 12.9*  HCT 38.8* 39.3 39.0  MCV 85.7 85.6 86.1  PLT 292 289 213   Basic Metabolic Panel: Recent Labs  Lab 11/13/17 0648 11/14/17 0654 11/15/17 0610 11/16/17 0554 11/17/17 0506 11/18/17 0421  NA 132* 135 134* 131*  --  132*  K 4.2 4.7 4.7 4.3  --  4.5  CL 97* 98* 99* 97*  --  96*  CO2 25 25 26 23   --  26  GLUCOSE 155* 149* 143* 204*  --  282*  BUN 17 17 19  36*  --  46*  CREATININE 0.79 0.73 0.82 0.77 0.70 0.70  CALCIUM 8.8*  9.3 9.1 9.3  --  8.9   GFR: Estimated Creatinine Clearance: 102.7 mL/min (by C-G formula based on SCr of 0.7 mg/dL).   Cardiac Enzymes: Recent Labs  Lab 11/15/17 2116  CKTOTAL 163  CKMB 2.9   CBG: Recent Labs  Lab 11/18/17 0803 11/18/17 1152 11/18/17 1742 11/18/17 2150 11/19/17 0832  GLUCAP 284* 212* 215* 197* 292*   Radiology Studies: No results found.  Scheduled Meds: . acetaminophen  650 mg Oral Q8H  . aspirin EC  81 mg Oral Daily  . enoxaparin (LOVENOX) injection  40 mg Subcutaneous Q24H  . feeding supplement (ENSURE ENLIVE)  237 mL Oral BID BM  . [START ON 11/20/2017] gabapentin  100 mg Oral QHS  . Immune Globulin 10%  400 mg/kg Intravenous Q24H  . insulin aspart  0-20 Units Subcutaneous TID WC  . insulin aspart  3 Units Subcutaneous TID WC  . insulin detemir  18 Units Subcutaneous BID  . irbesartan  300 mg Oral Daily  . methylPREDNISolone (SOLU-MEDROL) injection  40 mg Intravenous Q12H  . metoprolol succinate  25 mg Oral Daily  . tamsulosin  0.4 mg Oral QHS   Continuous Infusions:    LOS: 6 days    Time spent: 30 minutes    Barton Dubois, MD [facility] Pager [phone]  If 7PM-7AM, please contact night-coverage [email or web address] Password [facility] 11/19/2017, 10:36 AM

## 2017-11-19 NOTE — Progress Notes (Signed)
Once again he is very lethargic nif - 5 , VC 300

## 2017-11-20 ENCOUNTER — Inpatient Hospital Stay (HOSPITAL_COMMUNITY): Payer: Federal, State, Local not specified - PPO

## 2017-11-20 ENCOUNTER — Inpatient Hospital Stay (HOSPITAL_COMMUNITY): Payer: Federal, State, Local not specified - PPO | Admitting: Anesthesiology

## 2017-11-20 DIAGNOSIS — L89151 Pressure ulcer of sacral region, stage 1: Secondary | ICD-10-CM

## 2017-11-20 DIAGNOSIS — A419 Sepsis, unspecified organism: Secondary | ICD-10-CM

## 2017-11-20 DIAGNOSIS — R0602 Shortness of breath: Secondary | ICD-10-CM

## 2017-11-20 LAB — URINALYSIS, ROUTINE W REFLEX MICROSCOPIC
BACTERIA UA: NONE SEEN
Bilirubin Urine: NEGATIVE
Glucose, UA: 150 mg/dL — AB
KETONES UR: NEGATIVE mg/dL
NITRITE: POSITIVE — AB
PROTEIN: 30 mg/dL — AB
SQUAMOUS EPITHELIAL / LPF: NONE SEEN
Specific Gravity, Urine: 1.024 (ref 1.005–1.030)
pH: 5 (ref 5.0–8.0)

## 2017-11-20 LAB — BASIC METABOLIC PANEL
Anion gap: 12 (ref 5–15)
BUN: 54 mg/dL — AB (ref 6–20)
CHLORIDE: 98 mmol/L — AB (ref 101–111)
CO2: 29 mmol/L (ref 22–32)
CREATININE: 0.89 mg/dL (ref 0.61–1.24)
Calcium: 10.4 mg/dL — ABNORMAL HIGH (ref 8.9–10.3)
GFR calc non Af Amer: >60 mL/min (ref >60)
Glucose, Bld: 240 mg/dL — ABNORMAL HIGH (ref 65–99)
POTASSIUM: 4.7 mmol/L (ref 3.5–5.1)
SODIUM: 139 mmol/L (ref 135–145)

## 2017-11-20 LAB — BLOOD GAS, ARTERIAL
ACID-BASE EXCESS: 7.1 mmol/L — AB (ref 0.0–2.0)
Acid-Base Excess: 2.1 mmol/L — ABNORMAL HIGH (ref 0.0–2.0)
Bicarbonate: 30.9 mmol/L — ABNORMAL HIGH (ref 20.0–28.0)
Bicarbonate: 26.4 mmol/L (ref 20.0–28.0)
DRAWN BY: 23534
Delivery systems: POSITIVE
Drawn by: 105551
EXPIRATORY PAP: 6
FIO2: 60
FIO2: 0.7
INSPIRATORY PAP: 14
MECHVT: 640 mL
Mechanical Rate: 8
O2 SAT: 96.8 %
O2 SAT: 98.8 %
PATIENT TEMPERATURE: 37
PCO2 ART: 39.8 mmHg (ref 32.0–48.0)
PCO2 ART: 37.6 mmHg (ref 32.0–48.0)
PEEP: 5 cmH2O
PH ART: 7.499 — AB (ref 7.350–7.450)
PH ART: 7.45 (ref 7.350–7.450)
RATE: 8 resp/min
RATE: 18 resp/min
pO2, Arterial: 89.3 mmHg (ref 83.0–108.0)
pO2, Arterial: 158 mmHg — ABNORMAL HIGH (ref 83.0–108.0)

## 2017-11-20 LAB — MAGNESIUM
MAGNESIUM: 2 mg/dL (ref 1.7–2.4)
MAGNESIUM: 2.1 mg/dL (ref 1.7–2.4)

## 2017-11-20 LAB — TROPONIN I
TROPONIN I: 0.63 ng/mL — AB (ref <0.03)
Troponin I: 0.96 ng/mL (ref <0.03)

## 2017-11-20 LAB — GLUCOSE, CAPILLARY
Glucose-Capillary: 326 mg/dL — ABNORMAL HIGH (ref 65–99)
Glucose-Capillary: 330 mg/dL — ABNORMAL HIGH (ref 65–99)
Glucose-Capillary: 254 mg/dL — ABNORMAL HIGH (ref 65–99)
Glucose-Capillary: 244 mg/dL — ABNORMAL HIGH (ref 65–99)

## 2017-11-20 LAB — CBC
HEMATOCRIT: 34.8 % — AB (ref 39.0–52.0)
Hemoglobin: 11.6 g/dL — ABNORMAL LOW (ref 13.0–17.0)
MCH: 29 pg (ref 26.0–34.0)
MCHC: 33.3 g/dL (ref 30.0–36.0)
MCV: 87 fL (ref 78.0–100.0)
PLATELETS: 349 10*3/uL (ref 150–400)
RBC: 4 MIL/uL — ABNORMAL LOW (ref 4.22–5.81)
RDW: 13.9 % (ref 11.5–15.5)
WBC: 4.3 10*3/uL (ref 4.0–10.5)

## 2017-11-20 LAB — INFLUENZA PANEL BY PCR (TYPE A & B)
INFLAPCR: NEGATIVE
INFLBPCR: NEGATIVE

## 2017-11-20 LAB — PHOSPHORUS
Phosphorus: 2.4 mg/dL — ABNORMAL LOW (ref 2.5–4.6)
Phosphorus: 1.3 mg/dL — ABNORMAL LOW (ref 2.5–4.6)

## 2017-11-20 LAB — PROCALCITONIN: Procalcitonin: 1.67 ng/mL

## 2017-11-20 MED ORDER — PIPERACILLIN-TAZOBACTAM 3.375 G IVPB
3.3750 g | Freq: Once | INTRAVENOUS | Status: AC
  Administered 2017-11-20: 3.375 g via INTRAVENOUS
  Filled 2017-11-20: qty 50

## 2017-11-20 MED ORDER — FENTANYL CITRATE (PF) 100 MCG/2ML IJ SOLN
100.0000 ug | INTRAMUSCULAR | Status: DC | PRN
  Filled 2017-11-20: qty 2

## 2017-11-20 MED ORDER — ACETAMINOPHEN 650 MG RE SUPP
650.0000 mg | RECTAL | Status: DC | PRN
  Administered 2017-11-20 – 2017-11-21 (×2): 650 mg via RECTAL
  Filled 2017-11-20 (×2): qty 1

## 2017-11-20 MED ORDER — METOPROLOL TARTRATE 5 MG/5ML IV SOLN
2.5000 mg | Freq: Three times a day (TID) | INTRAVENOUS | Status: DC
  Administered 2017-11-20: 2.5 mg via INTRAVENOUS
  Filled 2017-11-20: qty 5

## 2017-11-20 MED ORDER — VANCOMYCIN HCL IN DEXTROSE 1-5 GM/200ML-% IV SOLN
1000.0000 mg | Freq: Two times a day (BID) | INTRAVENOUS | Status: DC
  Administered 2017-11-20 – 2017-11-22 (×4): 1000 mg via INTRAVENOUS
  Filled 2017-11-20 (×6): qty 200

## 2017-11-20 MED ORDER — PANTOPRAZOLE SODIUM 40 MG IV SOLR
40.0000 mg | INTRAVENOUS | Status: DC
  Administered 2017-11-20 – 2017-11-23 (×4): 40 mg via INTRAVENOUS
  Filled 2017-11-20 (×4): qty 40

## 2017-11-20 MED ORDER — ORAL CARE MOUTH RINSE
15.0000 mL | Freq: Four times a day (QID) | OROMUCOSAL | Status: DC
  Administered 2017-11-20 – 2017-11-21 (×6): 15 mL via OROMUCOSAL

## 2017-11-20 MED ORDER — SODIUM CHLORIDE 0.9 % IV SOLN
INTRAVENOUS | Status: AC
  Administered 2017-11-20: 05:00:00 via INTRAVENOUS

## 2017-11-20 MED ORDER — HYDRALAZINE HCL 20 MG/ML IJ SOLN
10.0000 mg | INTRAMUSCULAR | Status: DC | PRN
  Administered 2017-11-20: 10 mg via INTRAVENOUS
  Filled 2017-11-20: qty 1

## 2017-11-20 MED ORDER — ALBUTEROL SULFATE (2.5 MG/3ML) 0.083% IN NEBU: 2.5000 mg | INHALATION_SOLUTION | RESPIRATORY_TRACT | Status: DC | PRN

## 2017-11-20 MED ORDER — SODIUM CHLORIDE 0.9 % IV SOLN
INTRAVENOUS | Status: AC
  Filled 2017-11-20: qty 1500

## 2017-11-20 MED ORDER — CHLORHEXIDINE GLUCONATE 0.12% ORAL RINSE (MEDLINE KIT)
15.0000 mL | Freq: Two times a day (BID) | OROMUCOSAL | Status: DC
  Administered 2017-11-20 – 2017-11-21 (×2): 15 mL via OROMUCOSAL

## 2017-11-20 MED ORDER — SUCCINYLCHOLINE CHLORIDE 20 MG/ML IJ SOLN
INTRAMUSCULAR | Status: DC | PRN
  Administered 2017-11-20: 100 mg via INTRAVENOUS

## 2017-11-20 MED ORDER — PIPERACILLIN-TAZOBACTAM 3.375 G IVPB
3.3750 g | Freq: Three times a day (TID) | INTRAVENOUS | Status: DC
  Administered 2017-11-20 – 2017-11-21 (×5): 3.375 g via INTRAVENOUS
  Filled 2017-11-20 (×6): qty 50

## 2017-11-20 MED ORDER — ETOMIDATE 2 MG/ML IV SOLN
INTRAVENOUS | Status: DC | PRN
  Administered 2017-11-20: 8 mg via INTRAVENOUS

## 2017-11-20 MED ORDER — FENTANYL CITRATE (PF) 100 MCG/2ML IJ SOLN
100.0000 ug | INTRAMUSCULAR | Status: DC | PRN
  Administered 2017-11-21: 100 ug via INTRAVENOUS
  Filled 2017-11-20 (×2): qty 2

## 2017-11-20 MED ORDER — PRO-STAT SUGAR FREE PO LIQD
30.0000 mL | Freq: Two times a day (BID) | ORAL | Status: DC
  Administered 2017-11-20: 23:00:00
  Administered 2017-11-21 – 2017-11-22 (×3): 30 mL
  Filled 2017-11-20 (×4): qty 30

## 2017-11-20 MED ORDER — MIDAZOLAM HCL 2 MG/2ML IJ SOLN
2.0000 mg | INTRAMUSCULAR | Status: DC | PRN
  Administered 2017-11-21: 2 mg via INTRAVENOUS
  Filled 2017-11-20: qty 2

## 2017-11-20 MED ORDER — VITAL HIGH PROTEIN PO LIQD
1000.0000 mL | ORAL | Status: AC
  Administered 2017-11-20 – 2017-11-22 (×3): 1000 mL
  Filled 2017-11-20 (×3): qty 1000

## 2017-11-20 MED ORDER — PIPERACILLIN-TAZOBACTAM 3.375 G IVPB: 3.3750 g | Freq: Three times a day (TID) | INTRAVENOUS | Status: DC

## 2017-11-20 MED ORDER — MIDAZOLAM HCL 2 MG/2ML IJ SOLN
2.0000 mg | INTRAMUSCULAR | Status: DC | PRN
  Administered 2017-11-20: 2 mg via INTRAVENOUS
  Filled 2017-11-20: qty 2

## 2017-11-20 MED ORDER — VANCOMYCIN HCL 10 G IV SOLR
1500.0000 mg | Freq: Once | INTRAVENOUS | Status: AC
  Administered 2017-11-20: 1500 mg via INTRAVENOUS
  Filled 2017-11-20: qty 1500

## 2017-11-20 NOTE — Progress Notes (Signed)
PROGRESS NOTE    Raymond [name]  UDJ:[id number] DOB: Sep 12, 1956 DOA: 11/24/2017 PCP: Caren Macadam, MD   Brief Narrative:  This is a 62 year old male with recent TURP secondary to bladder tumor who presented to the hospital with generalized weakness, pelvic pain and numbness for past 3 weeks.  He has had significant urinary retention with recent Foley catheter placement.  He was noted to have significant generalized weakness with serum sodium of 122, related to poor PO intake, for which he was placed on IV normal saline with improvement into the normal range.  In speaking with his primary care provider Dr. Mannie Stabile, there was concern about some possible cauda equina and MRI of the lumbar spine was performed demonstrating some lower lumbar findings, but no cauda equina at the present. He continues to have weakness and is working with PT. He continues to have ongoing pain as well as significant weakness that cannot be attributed to hyponatremia at this point.  Family at the bedside is very concerned about the fact that he has not made any progress.  Therefore, neurology was consulted with further MRI imaging performed today with no major acute abnormalities noted; but suggesting disc protrusion at L5-S1 level.  Patient's case complicated with sepsis from aspiration PNA vs HCAP and UTI. Patient now intubated and mechanically ventilated. Had short episode of V-tach that triggered code blue; patient successfully resuscitated.   Assessment & Plan:   Active Problems:   Hyponatremia   Protein-calorie malnutrition, severe   Pressure injury of skin   1. Generalized progressive weakness/with concerns for GBS.  This was initially thought to be related to hyponatremia secondary to poor oral intake, but this has improved with IV fluid administration.  Sodium levels have stayed in the 134-135 range at this time. IVF's discontinued currently.   Per neurology, concerns for GBS and empirically completed IVIG. Will  continue steroids empirically for conservative management of disc protrusion. 2. Dysphagia/anorexia/protein calorie malnutrition (Severe). Appreciate nutrition service and speech therapy recommendations. Status post aspiration, now intubated. Once stable will initiate OG tube feeding and pro-stat.  3. Pelvic floor pain-ongoing.  No major discomfort reported. Will continue tx with toradol PRN and schedule tylenol. No narcotics. Follow clinical response. 4. Constipation. Patient moved BM after enema on 2/16; no acute complaints currently. 5. Status post transurethral resection of bladder tumor.  Continue outpatient follow up by urology. Holding flomax in the setting of low BP. Will continue foley, receiving treatment with abx's. 6. Diabetes.  A1c 8.7.  Anticipating increasing his CBGs due to the use of his steroids.  70/30 discontinued on 2/22 for a safer regimen while inpatient and using steroids. Continue levemir and SSI. Appreciate diabetes coordinator for recommendation. 7. Acute resp failure: in the setting of HCAP vs aspiration. Cultures taken, started on vanc and zosyn. Intubated for airway protection and proper ventilation. ABG stable. 8. UTI: UA suggesting UTI. Zosyn will cover. Follow urine culture  9. Sepsis: in the setting of UTI/HCAP vs aspiration PNA. Fluids given, cultures taken and broad antibiotics initiated. 10. V-Tach: in the setting of intubation/code blue: will check troponin, check 2-D echo.   DVT prophylaxis:Lovenox Code Status: Full code Family Communication: wife at bedside  Disposition Plan: Completed IVIG empirical treatment for GBS; now intubated for airway protection and receiving broad spectrum antibiotics for HCAP vs aspiration and UTI. Continue steroids. Check troponin and follow echo.  Consultants:   Neurology   Pulmonary/critical care service   Procedures:   Mechanically ventilated   Echo: pending  Antimicrobials:   None   Subjective: Positive  tachypnea, scattered rhonchi, patient now mechanically ventilated.  Objective: Vitals:   11/20/17 1229 11/20/17 1230 11/20/17 1231 11/20/17 1232  BP:  (!) 150/127    Pulse: (!) 105 (!) 108 (!) 107 (!) 107  Resp: (!) 24 (!) 24 (!) 25 (!) 24  Temp:    98.9 F (37.2 C)  TempSrc:    Axillary  SpO2: 100% 100% 100% 100%  Weight:      Height:        Intake/Output Summary (Last 24 hours) at 11/20/2017 1334 Last data filed at 11/20/2017 6384 Gross per 24 hour  Intake 793.75 ml  Output 1600 ml  Net -806.25 ml   Filed Weights   11/18/17 0500 11/19/17 0400 11/20/17 0500  Weight: 79.6 kg (175 lb 7.8 oz) 76.8 kg (169 lb 5 oz) 72.5 kg (159 lb 13.3 oz)    Examination: General exam: Patient spike fever overnight no fever. Patient with worsening in resp status and not following commands currently. Patient was initially on BIPAP and despite support, his RR was in mid 30's, CXR demonstrated left infiltrate. Patient felt unable to protect airways and ended been intubated. Right after intubation he experienced V-tach with subsequent code blue. BIPAP in place. Patient was successfully resuscitated. Respiratory system: diffuse rhonchi, no wheezing.  Cardiovascular system: S1 and S2, no rubs, no gallops Gastrointestinal system: soft, NT, ND, positive BS Central nervous system: increase lethargic today; moving upper extremities spontaneously, no nystagmus. Unable to follow commands to fully assess neurologic exam.  Extremities: no edema, no cyanosis, no clubbing  Skin: stage 1 sacral pressure injury.   Data Reviewed: I have personally reviewed following labs and imaging studies  CBC: Recent Labs  Lab 11/14/17 0654 11/16/17 0554 11/20/17 0155  WBC 11.5* 12.1* 4.3  HGB 13.1 12.9* 11.6*  HCT 39.3 39.0 34.8*  MCV 85.6 86.1 87.0  PLT 289 324 536   Basic Metabolic Panel: Recent Labs  Lab 11/14/17 0654 11/15/17 0610 11/16/17 0554 11/17/17 0506 11/18/17 0421 11/20/17 0155 11/20/17 1042  NA  135 134* 131*  --  132* 139  --   K 4.7 4.7 4.3  --  4.5 4.7  --   CL 98* 99* 97*  --  96* 98*  --   CO2 '25 26 23  ' --  26 29  --   GLUCOSE 149* 143* 204*  --  282* 240*  --   BUN 17 19 36*  --  46* 54*  --   CREATININE 0.73 0.82 0.77 0.70 0.70 0.89  --   CALCIUM 9.3 9.1 9.3  --  8.9 10.4*  --   MG  --   --   --   --   --   --  2.0  PHOS  --   --   --   --   --   --  2.4*   GFR: Estimated Creatinine Clearance: 87.1 mL/min (by C-G formula based on SCr of 0.89 mg/dL).   Cardiac Enzymes: Recent Labs  Lab 11/15/17 2116  CKTOTAL 163  CKMB 2.9   CBG: Recent Labs  Lab 11/19/17 0832 11/19/17 1156 11/19/17 1803 11/20/17 0806 11/20/17 1224  GLUCAP 292* 222* 305* 326* 330*   Radiology Studies: Portable Chest X-ray  Result Date: 11/20/2017 CLINICAL DATA:  Intubated EXAM: PORTABLE CHEST 1 VIEW COMPARISON:  Chest radiograph from earlier today. FINDINGS: Endotracheal tube tip is 4.5 cm above the carina. Enteric tube terminates in the lower thoracic  esophagus. Stable cardiomediastinal silhouette with normal heart size. No pneumothorax. No pleural effusion. Patchy opacities at both lung bases, left greater than right, not appreciably changed. IMPRESSION: 1. Well-positioned endotracheal tube. 2. Enteric tube terminates in the lower thoracic esophagus and should be advanced 10 cm. 3. Stable patchy bibasilar lung opacities, left greater than right, suspicious for pneumonia. These results were called by telephone at the time of interpretation on 11/20/2017 at 12:27 pm to [place], who verbally acknowledged these results. Electronically Signed   By: Ilona Sorrel M.D.   On: 11/20/2017 12:28   Dg Chest Port 1 View  Result Date: 11/20/2017 CLINICAL DATA:  62 year old male with altered mental status and sepsis. EXAM: PORTABLE CHEST 1 VIEW COMPARISON:  Chest radiograph dated 10/22/2015 FINDINGS: Left lung base atelectatic changes. Infiltrate is not excluded. Clinical correlation is recommended the  right lung is clear. No pleural effusion or pneumothorax. The cardiac silhouette is within normal limits. No acute osseous pathology. IMPRESSION: Left lung base atelectasis. Infiltrate is not excluded. Clinical correlation is recommended. Electronically Signed   By: Anner Crete M.D.   On: 11/20/2017 02:04    Scheduled Meds: . acetaminophen  650 mg Oral Q8H  . chlorhexidine gluconate (MEDLINE KIT)  15 mL Mouth Rinse BID  . enoxaparin (LOVENOX) injection  40 mg Subcutaneous Q24H  . feeding supplement (ENSURE ENLIVE)  237 mL Oral BID BM  . feeding supplement (PRO-STAT SUGAR FREE 64)  30 mL Per Tube BID  . feeding supplement (VITAL HIGH PROTEIN)  1,000 mL Per Tube Q24H  . insulin aspart  0-20 Units Subcutaneous TID WC  . insulin aspart  3 Units Subcutaneous TID WC  . insulin detemir  18 Units Subcutaneous BID  . mouth rinse  15 mL Mouth Rinse QID  . methylPREDNISolone (SOLU-MEDROL) injection  40 mg Intravenous Q12H  . pantoprazole (PROTONIX) IV  40 mg Intravenous Q24H   Continuous Infusions: . piperacillin-tazobactam (ZOSYN)  IV 3.375 g (11/20/17 1327)  . vancomycin       LOS: 7 days    Time spent: 55 minutes    Barton Dubois, MD [facility] Pager [phone]  If 7PM-7AM, please contact night-coverage [email or web address] Password [facility] 11/20/2017, 1:34 PM

## 2017-11-20 NOTE — Progress Notes (Signed)
ANTIBIOTIC CONSULT NOTE-Preliminary  Pharmacy Consult for Vancomycin and Zosyn Indication: Sepsis  No Known Allergies  Patient Measurements: Height: 6\' 1"  (185.4 cm) Weight: 169 lb 5 oz (76.8 kg) IBW/kg (Calculated) : 79.9  Vital Signs: Temp: 101.6 F (38.7 C) (02/24 0100) Temp Source: Rectal (02/24 0100) BP: 157/95 (02/24 0000) Pulse Rate: 119 (02/24 0340)  Labs: Recent Labs    11/17/17 0506 11/18/17 0421 11/20/17 0155  WBC  --   --  4.3  HGB  --   --  11.6*  PLT  --   --  349  CREATININE 0.70 0.70 0.89    Estimated Creatinine Clearance: 92.3 mL/min (by C-G formula based on SCr of 0.89 mg/dL).  No results for input(s): VANCOTROUGH, VANCOPEAK, VANCORANDOM, GENTTROUGH, GENTPEAK, GENTRANDOM, TOBRATROUGH, TOBRAPEAK, TOBRARND, AMIKACINPEAK, AMIKACINTROU, AMIKACIN in the last 72 hours.   Microbiology: Recent Results (from the past 720 hour(s))  MRSA PCR Screening     Status: None   Collection Time: 11/18/17 11:40 PM  Result Value Ref Range Status   MRSA by PCR NEGATIVE NEGATIVE Final    Comment:        The GeneXpert MRSA Assay (FDA approved for NASAL specimens only), is one component of a comprehensive MRSA colonization surveillance program. It is not intended to diagnose MRSA infection nor to guide or monitor treatment for MRSA infections. Performed at [facility], [address], [place], [place] [address]   Culture, blood (x 2)     Status: None (Preliminary result)   Collection Time: 11/20/17  1:55 AM  Result Value Ref Range Status   Specimen Description BLOOD LEFT ARM  Final   Special Requests   Final    BOTTLES DRAWN AEROBIC AND ANAEROBIC Blood Culture adequate volume Performed at [facility], [address], [place], [place] [address]    Culture PENDING  Incomplete   Report Status PENDING  Incomplete  Culture, blood (x 2)     Status: None (Preliminary result)   Collection Time: 11/20/17  2:10 AM  Result Value Ref Range Status   Specimen Description  BLOOD RIGHT ARM  Final   Special Requests   Final    BOTTLES DRAWN AEROBIC AND ANAEROBIC Blood Culture adequate volume DRAWN BY RN Performed at [facility], [address] [place], [place] [address]    Culture PENDING  Incomplete   Report Status PENDING  Incomplete    Medical History: Past Medical History:  Diagnosis Date  . Anemia   . Arthritis    Hands and left knee  . Coronary artery disease   . Diabetes mellitus without complication ([place])   . High cholesterol   . Hypertension   . 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 Pharmacy has been consulted for Vancomycin and Zosyn dosing for sepsis.  Goal of Therapy:  Vancomycin troughs 15-20 mcg/ml Eradicate infection  Plan:  Preliminary review of pertinent patient information completed.  Protocol will be initiated with a loading dose of Vancomycin 1500 mg IV and Zosyn 3.375 Gm IV.  Forestine Na clinical pharmacist will complete review during morning rounds to assess patient and finalize treatment regimen if needed.  Norberto Sorenson, [facility] 11/20/2017,4:17 AM

## 2017-11-20 NOTE — Progress Notes (Signed)
Changed BIPAP machines from dream station to V60 -- Oxygen is more titratable 14/6-- 60% percent oxygem.

## 2017-11-20 NOTE — Progress Notes (Signed)
Respiratory Care Note: Patient is unable to preform NIF or FVC due to his current condition and being on BIPAP. Will continue to monitor. RN aware.

## 2017-11-20 NOTE — Consult Note (Signed)
Consult requested by: [facility] hospitalist, Dr. Dyann Kief Consult requested for: Respiratory failure  HPI: This is a 62 year old with a complicated recent history.  He has a long known history of diabetes coronary disease arthritis in multiple joints.  He had a recent transurethral resection of a bladder cancer and also had a recent left hernia repair and apparently started having increasing problems after initially doing well after his transurethral resection of the bladder cancer.  He was initially able to get up and move around went to see his diabetic doctor was told that his blood sugar was up and that he needed to cut down on his use of carbohydrates but continued to get weak.  He is lost approximately 40 pounds in the last 2 months according to family.  He said severe weakness that started in his legs.  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  He had trouble with urinary retention and had Foley catheter placed x2.  He was treated for urinary tract infection.  He has been having back pain that has kept him from sleeping.  When he came to the emergency department he was noted to be hyponatremic and it was thought that perhaps his weakness was related to that but he continued having weakness after his sodium was replaced.  Neurology was consulted and a tentative diagnosis of Guillain Barre syndrome was made and he was started on immunoglobulin therapy but that has not made any difference.  His family says that he had some trouble with drinking thin liquids and coughed after taking water and apparently had an episode that seemed to be some choking with food.  This was about 3 days ago.  He had another episode yesterday.  He has become increasingly somnolent poorly responsive now is on BiPAP but still has a respiratory rate of about 30.  No known history of any lung disease in the past.  His father did  have lung cancer.  Past Medical History:  Diagnosis Date  . Anemia   . Arthritis    Hands and left knee  . Coronary artery disease   . Diabetes mellitus without complication ([facility])   . High cholesterol   . Hypertension   . MI (myocardial infarction) ([facility]) 2009     Family History  Problem Relation Age of Onset  . Diabetes Mother   . Heart attack Mother   . Cancer Father        lung  . Stroke Paternal Grandfather   . Colon cancer Paternal Uncle      Social History   Socioeconomic History  . Marital status: Married    Spouse name: None  . Number of children: None  . Years of education: None  . Highest education level: None  Social Needs  . Financial resource strain: None  . Food insecurity - worry: None  . Food insecurity - inability: None  . Transportation needs - medical: None  . Transportation needs - non-medical: None  Occupational History  . None  Tobacco Use  . Smoking status: Never Smoker  . Smokeless tobacco: Never Used  Substance and Sexual Activity  . Alcohol use: No  . Drug use: No  . Sexual activity: Yes    Birth control/protection: None  Other Topics Concern  . None  Social History Narrative   Grew up in [place], [place]. Married, has 3 children.  Disability, 2009.    Worked in [employer] information [employer] prior.      ROS: Unobtainable    Objective: Vital signs in last 24 hours: Temp:  [98.5 F (36.9 C)-101.6 F (38.7 C)] 100.9 F (38.3 C) (02/24 0800) Pulse Rate:  [92-126] 119 (02/24 0912) Resp:  [24-38] 34 (02/24 0912) BP: (117-178)/(67-104) 147/78 (02/24 0800) SpO2:  [85 %-100 %] 100 % (02/24 0912) Weight:  [72.5 kg (159 lb 13.3 oz)] 72.5 kg (159 lb 13.3 oz) (02/24 0500) Weight change: -4.3 kg (-7.7 oz) Last BM Date: 11/12/17  Intake/Output from previous day: 02/23 0701 - 02/24 0700 In: 793.8 [I.V.:243.8; IV Piggyback:550] Out: 1600 [Urine:1600]  PHYSICAL EXAM Constitutional: He is somnolent does respond some to sternal rub and he  is on BiPAP.  Eyes: Pupils do react.  Ears nose mouth and throat: I cannot tell much about his throat with his BiPAP but I do not see any masses.  His neck is supple.  Cardiovascular: His heart is regular with normal heart sounds.  No edema.  Respiratory: Despite using BiPAP his respiratory rates about 30 and he has some crackles in the left base.  Gastrointestinal: His abdomen is soft with no masses.  Skin: Skin turgor fair musculoskeletal: Cannot assess neurological: Cannot assess psychiatric: Cannot assess  Lab Results: Basic Metabolic Panel: Recent Labs    11/18/17 0421 11/20/17 0155  NA 132* 139  K 4.5 4.7  CL 96* 98*  CO2 26 29  GLUCOSE 282* 240*  BUN 46* 54*  CREATININE 0.70 0.89  CALCIUM 8.9 10.4*   Liver Function Tests: No results for input(s): AST, ALT, ALKPHOS, BILITOT, PROT, ALBUMIN in the last 72 hours. No results for input(s): LIPASE, AMYLASE in the last 72 hours. No results for input(s): AMMONIA in the last 72 hours. CBC: Recent Labs    11/20/17 0155  WBC 4.3  HGB 11.6*  HCT 34.8*  MCV 87.0  PLT 349   Cardiac Enzymes: No results for input(s): CKTOTAL, CKMB, CKMBINDEX, TROPONINI in the last 72 hours. BNP: No results for input(s): PROBNP in the last 72 hours. D-Dimer: No results for input(s): DDIMER in the last 72 hours. CBG: Recent Labs    11/18/17 1742 11/18/17 2150 11/19/17 0832 11/19/17 1156 11/19/17 1803 11/20/17 0806  GLUCAP 215* 197* 292* 222* 305* 326*   Hemoglobin A1C: No results for input(s): HGBA1C in the last 72 hours. Fasting Lipid Panel: No results for input(s): CHOL, HDL, LDLCALC, TRIG, CHOLHDL, LDLDIRECT in the last 72 hours. Thyroid Function Tests: No results for input(s): TSH, T4TOTAL, FREET4, T3FREE, THYROIDAB in the last 72 hours. Anemia Panel: No results for input(s): VITAMINB12, FOLATE, FERRITIN, TIBC, IRON, RETICCTPCT in the last 72 hours. Coagulation: No results for input(s): LABPROT, INR in the last 72 hours. Urine Drug  Screen: Drugs of Abuse  No results found for: LABOPIA, COCAINSCRNUR, LABBENZ, AMPHETMU, THCU, LABBARB  Alcohol Level: No results for input(s): ETH in the last 72 hours. Urinalysis: Recent Labs    11/20/17 0439  COLORURINE YELLOW  LABSPEC 1.024  PHURINE 5.0  GLUCOSEU 150*  HGBUR SMALL*  BILIRUBINUR NEGATIVE  KETONESUR NEGATIVE  PROTEINUR 30*  NITRITE POSITIVE*  LEUKOCYTESUR TRACE*   Misc. Labs:   ABGS: Recent Labs    11/20/17 0408  PHART 7.499*  PO2ART 89.3  HCO3 30.9*     MICROBIOLOGY: Recent Results (from the past 240 hour(s))  MRSA PCR Screening     Status: None   Collection Time: 11/18/17 11:40 PM  Result Value Ref Range  Status   MRSA by PCR NEGATIVE NEGATIVE Final    Comment:        The GeneXpert MRSA Assay (FDA approved for NASAL specimens only), is one component of a comprehensive MRSA colonization surveillance program. It is not intended to diagnose MRSA infection nor to guide or monitor treatment for MRSA infections. Performed at [facility], [address], [place], [place] [address]   Culture, blood (x 2)     Status: None (Preliminary result)   Collection Time: 11/20/17  1:55 AM  Result Value Ref Range Status   Specimen Description BLOOD LEFT ARM  Final   Special Requests   Final    BOTTLES DRAWN AEROBIC AND ANAEROBIC Blood Culture adequate volume   Culture   Final    NO GROWTH < 12 HOURS Performed at [facility], [address], [place], [place] [address]    Report Status PENDING  Incomplete  Culture, blood (x 2)     Status: None (Preliminary result)   Collection Time: 11/20/17  2:10 AM  Result Value Ref Range Status   Specimen Description BLOOD RIGHT ARM  Final   Special Requests   Final    BOTTLES DRAWN AEROBIC AND ANAEROBIC Blood Culture adequate volume DRAWN BY RN   Culture   Final    NO GROWTH < 12 HOURS Performed at [facility], [address] [place], [place] [address]    Report Status PENDING  Incomplete     Studies/Results: Dg Chest Port 1 View  Result Date: 11/20/2017 CLINICAL DATA:  62 year old male with altered mental status and sepsis. EXAM: PORTABLE CHEST 1 VIEW COMPARISON:  Chest radiograph dated 10/22/2015 FINDINGS: Left lung base atelectatic changes. Infiltrate is not excluded. Clinical correlation is recommended the right lung is clear. No pleural effusion or pneumothorax. The cardiac silhouette is within normal limits. No acute osseous pathology. IMPRESSION: Left lung base atelectasis. Infiltrate is not excluded. Clinical correlation is recommended. Electronically Signed   By: Anner Crete M.D.   On: 11/20/2017 02:04    Medications:  Prior to Admission:  Medications Prior to Admission  Medication Sig Dispense Refill Last Dose  . aspirin 325 MG tablet Take 325 mg by mouth at bedtime.    11/09/2017 at Unknown time  . celecoxib (CELEBREX) 200 MG capsule Take 200 mg by mouth daily as needed for mild pain.    Past Month at Unknown time  . Choline Fenofibrate (FENOFIBRIC ACID) 135 MG CPDR Take 135 mg by mouth at bedtime.    11/09/2017 at Unknown time  . CINNAMON PO Take 1,000 mg by mouth at bedtime.    Past Month at Unknown time  . clopidogrel (PLAVIX) 75 MG tablet TAKE 1 TABLET DAILY (Patient taking differently: TAKE 1 TABLET AT BEDTIME) 90 tablet 3 11/08/2017 at 0800  . Coenzyme Q10 (COQ10) 100 MG CAPS Take 100 mg by mouth at bedtime.   Past Month at Unknown time  . ezetimibe (ZETIA) 10 MG tablet Take 10 mg by mouth at bedtime.    11/09/2017 at Unknown time  . ibuprofen (ADVIL,MOTRIN) 200 MG tablet Take 400 mg every 8 (eight) hours as needed by mouth for mild pain.    Past Week at Unknown time  . insulin lispro protamine-lispro (HUMALOG 75/25 MIX) (75-25) 100 UNIT/ML SUSP injection Inject 30-35 Units into the skin 2 (two) times daily with a meal. Patient takes 35 units in the morning and 30 units at night   11/09/2017 at 2000  . metFORMIN (GLUCOPHAGE) 500 MG  tablet Take 1,000 mg by mouth 2  (two) times daily with a meal.   11/09/2017 at Unknown time  . metoprolol succinate (TOPROL-XL) 25 MG 24 hr tablet Take 25 mg by mouth at bedtime.   11/09/2017 at 2000  . Multiple Vitamin (MULTIVITAMIN) tablet Take 1 tablet by mouth daily.   Past Month at Unknown time  . naproxen sodium (ANAPROX) 220 MG tablet Take 440 mg by mouth daily as needed (pain).   Past Week at Unknown time  . nitroGLYCERIN (NITROSTAT) 0.4 MG SL tablet Place 0.4 mg every 5 (five) minutes as needed under the tongue for chest pain.   Taking  . Omega-3 Fatty Acids (FISH OIL) 1200 MG CAPS Take 1,200 mg by mouth at bedtime.    Past Month at Unknown time  . rosuvastatin (CRESTOR) 40 MG tablet Take 40 mg by mouth at bedtime.    11/09/2017 at Unknown time  . sulfamethoxazole-trimethoprim (BACTRIM DS,SEPTRA DS) 800-160 MG tablet Take 1 tablet by mouth 2 (two) times daily.    11/22/2017 at 0800  . tamsulosin (FLOMAX) 0.4 MG CAPS capsule Take 0.4 mg by mouth at bedtime.   10/28/2017 at 0800  . telmisartan (MICARDIS) 80 MG tablet Take 80 mg by mouth at bedtime.    11/09/2017 at 2000  . traMADol (ULTRAM) 50 MG tablet Take 50 mg by mouth every 8 (eight) hours as needed for moderate pain.    11/09/2017 at 2100  . traZODone (DESYREL) 100 MG tablet Take 50-100 mg by mouth at bedtime as needed.    11/09/2017 at Unknown time  . VICTOZA 18 MG/3ML SOPN Inject 1.8 mg into the skin daily.   11/09/2017 at 2000   Scheduled: . acetaminophen  650 mg Oral Q8H  . enoxaparin (LOVENOX) injection  40 mg Subcutaneous Q24H  . feeding supplement (ENSURE ENLIVE)  237 mL Oral BID BM  . insulin aspart  0-20 Units Subcutaneous TID WC  . insulin aspart  3 Units Subcutaneous TID WC  . insulin detemir  18 Units Subcutaneous BID  . methylPREDNISolone (SOLU-MEDROL) injection  40 mg Intravenous Q12H  . metoprolol tartrate  2.5 mg Intravenous Q8H  . pantoprazole (PROTONIX) IV  40 mg Intravenous Q24H   Continuous: . sodium chloride 125 mL/hr at 11/20/17 0441  .  piperacillin-tazobactam (ZOSYN)  IV    . vancomycin     OXB:[id number], bisacodyl, hydrALAZINE, ketorolac, ondansetron **OR** ondansetron (ZOFRAN) IV  Assesment: He now has acute hypoxic respiratory failure and is on BiPAP.  I think he has aspirated.  He may have Guillain Barre syndrome and he is been treated for that and has finished 5 days of immunoglobulin replacement.  I am concerned that he cannot protect his airway.  BiPAP would make him more likely to have more aspiration.  If he has Guyon Barr syndrome and he is having more a sending paralysis he may be starting to affect his respiratory muscles.  He has back pain that seems to be related to problems in his thoracic spine per MRI.  It does not look like he has spinal stenosis or spinal cord compression that would cause his weakness in his legs. Active Problems:   Hyponatremia   Protein-calorie malnutrition, severe   Pressure injury of skin    Plan: I think we should go ahead and plan intubation and mechanical ventilation.    LOS: 7 days   Jester Klingberg L 11/20/2017, 9:33 AM

## 2017-11-20 NOTE — Progress Notes (Signed)
Elective intubation at 1115 During intubation, Pt went into VTACH,1 shock given, no pulse,cpr started and acls drugs given. Pt ROSC, rhythm sinus tach.   Jasneet Schobert Rica Mote, RN

## 2017-11-20 NOTE — Progress Notes (Signed)
Mr. Siedschlag was admitted with weakness, initially attributed to hyponatremia, but unfortunately worsened despite normalization of sodium level. GBS is suspected by neurologist and IVIG has been given.   Called to bedside to evaluate a change in status. Patient is now febrile, less responsive, and has increased O2-requirement.   CXR reveals opacity in LLL that could represent HCAP in this clinical setting. There was also a possible aspiration event yesterday afternoon.   Obtain blood and urine cultures, start empiric abx, check influenza PCR. Resume IVF with normal saline.

## 2017-11-21 ENCOUNTER — Inpatient Hospital Stay (HOSPITAL_COMMUNITY): Payer: Federal, State, Local not specified - PPO

## 2017-11-21 ENCOUNTER — Inpatient Hospital Stay (HOSPITAL_COMMUNITY)
Admit: 2017-11-21 | Discharge: 2017-11-21 | Disposition: A | Discharge status: Home / Self Care | Payer: Federal, State, Local not specified - PPO | Attending: Internal Medicine | Admitting: Internal Medicine

## 2017-11-21 DIAGNOSIS — E871 Hypo-osmolality and hyponatremia: Secondary | ICD-10-CM

## 2017-11-21 DIAGNOSIS — J9601 Acute respiratory failure with hypoxia: Secondary | ICD-10-CM

## 2017-11-21 DIAGNOSIS — J9602 Acute respiratory failure with hypercapnia: Secondary | ICD-10-CM

## 2017-11-21 DIAGNOSIS — Z978 Presence of other specified devices: Secondary | ICD-10-CM

## 2017-11-21 DIAGNOSIS — R1030 Lower abdominal pain, unspecified: Secondary | ICD-10-CM

## 2017-11-21 DIAGNOSIS — E43 Unspecified severe protein-calorie malnutrition: Secondary | ICD-10-CM

## 2017-11-21 DIAGNOSIS — G9341 Metabolic encephalopathy: Secondary | ICD-10-CM

## 2017-11-21 LAB — BLOOD GAS, ARTERIAL
Acid-Base Excess: 4.4 mmol/L — ABNORMAL HIGH (ref 0.0–2.0)
Acid-Base Excess: 4.6 mmol/L — ABNORMAL HIGH (ref 0.0–2.0)
Bicarbonate: 28.7 mmol/L — ABNORMAL HIGH (ref 20.0–28.0)
Bicarbonate: 28.7 mmol/L — ABNORMAL HIGH (ref 20.0–28.0)
DRAWN BY: 105551
Drawn by: 270161
FIO2: 40
FIO2: 40
LHR: 14 {breaths}/min
MECHANICAL RATE: 18
O2 SAT: 97.6 %
O2 Saturation: 98.2 %
PCO2 ART: 34.6 mmHg (ref 32.0–48.0)
PEEP: 5 cmH2O
Patient temperature: 38.1
RATE: 18 resp/min
VT: 640 mL
VT: 640 mL
pCO2 arterial: 38.2 mmHg (ref 32.0–48.0)
pH, Arterial: 7.511 — ABNORMAL HIGH (ref 7.350–7.450)
pH, Arterial: 7.48 — ABNORMAL HIGH (ref 7.350–7.450)
pO2, Arterial: 98.3 mmHg (ref 83.0–108.0)
pO2, Arterial: 114 mmHg — ABNORMAL HIGH (ref 83.0–108.0)

## 2017-11-21 LAB — GLUCOSE, CAPILLARY
GLUCOSE-CAPILLARY: 318 mg/dL — AB (ref 65–99)
GLUCOSE-CAPILLARY: 352 mg/dL — AB (ref 65–99)
GLUCOSE-CAPILLARY: 262 mg/dL — AB (ref 65–99)
Glucose-Capillary: 262 mg/dL — ABNORMAL HIGH (ref 65–99)
Glucose-Capillary: 288 mg/dL — ABNORMAL HIGH (ref 65–99)
Glucose-Capillary: 303 mg/dL — ABNORMAL HIGH (ref 65–99)
Glucose-Capillary: 329 mg/dL — ABNORMAL HIGH (ref 65–99)

## 2017-11-21 LAB — COMPREHENSIVE METABOLIC PANEL
ALBUMIN: 2 g/dL — AB (ref 3.5–5.0)
ALK PHOS: 36 U/L — AB (ref 38–126)
ALT: 42 U/L (ref 17–63)
AST: 83 U/L — ABNORMAL HIGH (ref 15–41)
Anion gap: 9 (ref 5–15)
BUN: 61 mg/dL — ABNORMAL HIGH (ref 6–20)
CALCIUM: 9.1 mg/dL (ref 8.9–10.3)
CHLORIDE: 105 mmol/L (ref 101–111)
CO2: 25 mmol/L (ref 22–32)
CREATININE: 1.09 mg/dL (ref 0.61–1.24)
GFR calc Af Amer: >60 mL/min (ref >60)
GFR calc non Af Amer: >60 mL/min (ref >60)
GLUCOSE: 334 mg/dL — AB (ref 65–99)
Potassium: 4 mmol/L (ref 3.5–5.1)
Sodium: 139 mmol/L (ref 135–145)
Total Bilirubin: 0.4 mg/dL (ref 0.3–1.2)
Total Protein: 7.4 g/dL (ref 6.5–8.1)

## 2017-11-21 LAB — CBC
HCT: 29.1 % — ABNORMAL LOW (ref 39.0–52.0)
HEMOGLOBIN: 9.2 g/dL — AB (ref 13.0–17.0)
MCH: 28.6 pg (ref 26.0–34.0)
MCHC: 31.6 g/dL (ref 30.0–36.0)
MCV: 90.4 fL (ref 78.0–100.0)
PLATELETS: 306 10*3/uL (ref 150–400)
RBC: 3.22 MIL/uL — AB (ref 4.22–5.81)
RDW: 14.3 % (ref 11.5–15.5)
WBC: 5.8 10*3/uL (ref 4.0–10.5)

## 2017-11-21 LAB — PHOSPHORUS: PHOSPHORUS: 1.7 mg/dL — AB (ref 2.5–4.6)

## 2017-11-21 LAB — ECHOCARDIOGRAM COMPLETE
Height: 73 in
WEIGHTICAEL: 2663.16 [oz_av]

## 2017-11-21 LAB — TROPONIN I: TROPONIN I: 0.74 ng/mL — AB (ref <0.03)

## 2017-11-21 LAB — TSH: TSH: 0.865 u[IU]/mL (ref 0.350–4.500)

## 2017-11-21 LAB — MAGNESIUM: Magnesium: 2.1 mg/dL (ref 1.7–2.4)

## 2017-11-21 MED ORDER — LEVETIRACETAM IN NACL 500 MG/100ML IV SOLN
500.0000 mg | Freq: Two times a day (BID) | INTRAVENOUS | Status: DC
Start: 2017-11-21 — End: 2017-11-22
  Administered 2017-11-22 (×2): 500 mg via INTRAVENOUS
  Filled 2017-11-21 (×4): qty 100

## 2017-11-21 MED ORDER — K PHOS MONO-SOD PHOS DI & MONO 155-852-130 MG PO TABS
500.0000 mg | ORAL_TABLET | Freq: Four times a day (QID) | ORAL | Status: AC
  Administered 2017-11-21 (×3): 500 mg via ORAL
  Filled 2017-11-21 (×3): qty 2

## 2017-11-21 MED ORDER — INSULIN ASPART 100 UNIT/ML ~~LOC~~ SOLN
0.0000 [IU] | SUBCUTANEOUS | Status: DC
  Administered 2017-11-21: 15 [IU] via SUBCUTANEOUS
  Administered 2017-11-21 (×2): 11 [IU] via SUBCUTANEOUS
  Administered 2017-11-21: 15 [IU] via SUBCUTANEOUS
  Administered 2017-11-21: 11 [IU] via SUBCUTANEOUS
  Administered 2017-11-21: 15 [IU] via SUBCUTANEOUS
  Administered 2017-11-22: 11 [IU] via SUBCUTANEOUS
  Administered 2017-11-22: 4 [IU] via SUBCUTANEOUS
  Administered 2017-11-22: 7 [IU] via SUBCUTANEOUS
  Administered 2017-11-22 (×2): 4 [IU] via SUBCUTANEOUS
  Administered 2017-11-23: 11 [IU] via SUBCUTANEOUS
  Administered 2017-11-23: 7 [IU] via SUBCUTANEOUS
  Administered 2017-11-23 – 2017-11-24 (×4): 11 [IU] via SUBCUTANEOUS
  Administered 2017-11-24: 7 [IU] via SUBCUTANEOUS
  Administered 2017-11-24 (×2): 11 [IU] via SUBCUTANEOUS
  Administered 2017-11-24: 7 [IU] via SUBCUTANEOUS
  Administered 2017-11-24: 11 [IU] via SUBCUTANEOUS
  Administered 2017-11-25: 7 [IU] via SUBCUTANEOUS
  Administered 2017-11-25 (×2): 15 [IU] via SUBCUTANEOUS
  Administered 2017-11-25: 11 [IU] via SUBCUTANEOUS
  Administered 2017-11-25: 15 [IU] via SUBCUTANEOUS
  Administered 2017-11-25: 20 [IU] via SUBCUTANEOUS
  Administered 2017-11-26: 15 [IU] via SUBCUTANEOUS
  Administered 2017-11-26: 20 [IU] via SUBCUTANEOUS
  Administered 2017-11-26 (×2): 15 [IU] via SUBCUTANEOUS
  Administered 2017-11-26 – 2017-11-27 (×5): 20 [IU] via SUBCUTANEOUS

## 2017-11-21 MED ORDER — ACETAMINOPHEN 325 MG PO TABS
650.0000 mg | ORAL_TABLET | Freq: Three times a day (TID) | ORAL | Status: DC
  Administered 2017-11-21 – 2017-11-24 (×9): 650 mg
  Filled 2017-11-21 (×8): qty 2

## 2017-11-21 MED ORDER — INSULIN ASPART 100 UNIT/ML ~~LOC~~ SOLN
8.0000 [IU] | Freq: Once | SUBCUTANEOUS | Status: AC
  Administered 2017-11-21: 8 [IU] via SUBCUTANEOUS

## 2017-11-21 MED ORDER — IMMUNE GLOBULIN (HUMAN) 10 GM/100ML IV SOLN
400.0000 mg/kg | INTRAVENOUS | Status: DC
  Administered 2017-11-21 – 2017-11-23 (×3): 30 g via INTRAVENOUS
  Filled 2017-11-21: qty 100
  Filled 2017-11-21 (×2): qty 200
  Filled 2017-11-21: qty 300

## 2017-11-21 MED ORDER — ORAL CARE MOUTH RINSE
15.0000 mL | OROMUCOSAL | Status: DC
  Administered 2017-11-21 – 2017-11-27 (×57): 15 mL via OROMUCOSAL

## 2017-11-21 MED ORDER — METOPROLOL TARTRATE 5 MG/5ML IV SOLN
2.5000 mg | INTRAVENOUS | Status: DC | PRN
  Administered 2017-11-21 – 2017-11-22 (×3): 5 mg via INTRAVENOUS
  Filled 2017-11-21 (×3): qty 5

## 2017-11-21 MED ORDER — PHENOL 1.4 % MT LIQD
1.0000 | OROMUCOSAL | Status: DC | PRN
  Filled 2017-11-21 (×2): qty 177

## 2017-11-21 MED ORDER — CHLORHEXIDINE GLUCONATE 0.12% ORAL RINSE (MEDLINE KIT)
15.0000 mL | Freq: Two times a day (BID) | OROMUCOSAL | Status: DC
  Administered 2017-11-21 – 2017-11-27 (×12): 15 mL via OROMUCOSAL

## 2017-11-21 NOTE — Progress Notes (Signed)
Nutrition Follow-up  DOCUMENTATION CODES:   Severe malnutrition in context of chronic illness  INTERVENTION: Patient is being transferred to [facility] when ICU bed is available. Adult enteral protocol was initiated and Vital HP started.   -Vital High Protein @ 40 ml/hr via OGT + Prostat 30 ml BID providing 1160 kcal, 114 gr protein and 803 ml water.   -Recommend transition to Vital 1.2 @ 60 ml/hr (1440 ml q 24 hr)  when tube feeds are resumed after transport to better meet energy needs (1728 kcal, 108 gr protein) : defer to [facility]- RD. If some reason pt doesn't transfer today RD will follow up again tomorrow.    Last documented BM- 2/16  NUTRITION DIAGNOSIS:   Severe Malnutrition(Chronic context) related to (Abdominal pian (worse w/ intake), weakness, lack of any appetite,) as evidenced by energy intake < or equal to 75% for > or equal to 1 month, percent weight loss.  GOAL:   Patient will meet greater than or equal to 90% of their needs   MONITOR:   PO intake, Supplement acceptance, Diet advancement, Labs, Weight trends  REASON FOR ASSESSMENT:   Consult Enteral/tube feeding initiation and management  ASSESSMENT:  Patient is currently intubated on ventilator support due to respiratory failure. He has aspiration pneumonia and severe malnutrition. Pt is febrile. Hx- Gullian Barre syndrome.  MV: 11.5  L/min  Temp (24hrs), Avg:101.1 F (38.4 C), Min:100.1 F (37.8 C), Max:102 F (38.9 C)  Sedative:  Fentanyl/versed    Intake/Output Summary (Last 24 hours) at 11/21/2017 1604 Last data filed at 11/21/2017 1500 Gross per 24 hour  Intake 1841.07 ml  Output 2900 ml  Net -1058.93 ml      BMP Latest Ref Rng & Units 11/21/2017 11/20/2017 11/18/2017  Glucose 65 - 99 mg/dL 334(H) 240(H) 282(H)  BUN 6 - 20 mg/dL 61(H) 54(H) 46(H)  Creatinine 0.61 - 1.24 mg/dL 1.09 0.89 0.70  Sodium 135 - 145 mmol/L 139 139 132(L)  Potassium 3.5 - 5.1 mmol/L 4.0 4.7 4.5  Chloride 101 -  111 mmol/L 105 98(L) 96(L)  CO2 22 - 32 mmol/L 25 29 26   Calcium 8.9 - 10.3 mg/dL 9.1 10.4(H) 8.9    Diet Order:  Diet NPO time specified  EDUCATION NEEDS:   No education needs have been identified at this time  Skin:   MASD and stage I   Last BM:  2/16  Height:   Ht Readings from Last 1 Encounters:  11/21/17 6\' 1"  (1.854 m)    Weight:   Wt Readings from Last 1 Encounters:  11/21/17 166 lb 7.2 oz (75.5 kg)  Admit wt 2/14 was -179 lb (81.2 kg)  Ideal Body Weight:  83.64 kg  BMI:  Body mass index is 21.96 kg/m.  Re-estimated Nutritional Needs:  Kcal:  1742   Protein:  90-112 gr  Fluid:  >2 liters daily  Colman Cater MS,RD,CSG,LDN Office: [phone] Pager: [phone]

## 2017-11-21 NOTE — Progress Notes (Addendum)
PROGRESS NOTE    Raymond [name]  NIO:[id number] DOB: [birth year] DOA: 10/28/2017 PCP: Caren Macadam, MD   Brief Narrative:  This is a 62 year old male with recent TURP secondary to bladder tumor who presented to the hospital with generalized weakness, pelvic pain and numbness for past 3 weeks.  He has had significant urinary retention with recent Foley catheter placement.  He was noted to have significant generalized weakness with serum sodium of 122, related to poor PO intake, for which he was placed on IV normal saline with improvement into the normal range.  In speaking with his primary care provider Dr. Mannie Stabile, there was concern about some possible cauda equina and MRI of the lumbar spine was performed demonstrating some lower lumbar findings, but no cauda equina at the present. He continues to have weakness and is working with PT. He continues to have ongoing pain as well as significant weakness that cannot be attributed to hyponatremia at this point.  Family at the bedside was very concerned about the fact that he has not made any progress.  Therefore, neurology was consulted with further MRI imaging performed on 11/15/17 with no major acute abnormalities noted; but suggesting disc protrusion at L5-S1 level.   Patient's case complicated with sepsis from aspiration PNA vs HCAP and UTI. Patient now intubated and mechanically ventilated. Had short episode of V-tach that triggered code blue; patient successfully resuscitated. Troponin mildly elevated most likely from demand ischemia.  Neurology has seen patient and recommended another 5 days of IVIG.  On 2/24 night, patient experienced rigid episode and rolling eyes back, suggesting seizure activity. EEG ordered. Even moving upper limbs he is no following commands currently, sedation and presumed post ictal playing a role.   At this moment, patient is stable, but requiring higher levels of care; case discussed with pulmonologist at [facility] and recommended  transfer to ICU at [facility]. Case discussed with Dr. Elsworth Soho who accepted patient. Neurology at [facility] was also involved as consultants.   Assessment & Plan: acute resp failure with hypoxia HCAP vs aspiration PNA UTI Elevated troponin  Protein-calorie malnutrition, severe Pressure injury of skin   1. Generalized progressive weakness/with concerns for GBS.  This was initially thought to be related to hyponatremia secondary to poor oral intake, but this has improved with IV fluid administration and after resolving electrolytes weakness continues. Sodium levels have stayed in the 134-135 range (corrected) at this time. Per neurology, high concerns for GBS and empirically completed 5 days of IVIG. Patient steroids empirically for conservative management of disc protrusion on his lumbar spine. Given deterioration neurology has recommended repeat 5 more days of IVIG. Neurology service a cone consulted for further assistance in his care. 2. Dysphagia/anorexia/protein calorie malnutrition (Severe). Appreciate nutrition service and speech therapy recommendations. Status post aspiration, now intubated. Started on tube feeding through OG. Depending clinical course will require PEG tube for further treatment. 3. Pelvic floor pain-ongoing.  No major discomfort reported. Continue tylenol as needed. No narcotics. Follow clinical response and outpatient follow up with urology. 4. Constipation. Patient last BM after enema on 2/16; no distension, no pain, positive BS. Was not eating. Follow bowel function while receiving nutrition now. 5. Status post transurethral resection of bladder tumor.  Continue outpatient follow up by urology. Holding flomax in the setting of low BP after intubation. Will continue foley, receiving treatment with abx's for presumed UTI. Resume flomax once BP more stable. 6. Diabetes.  A1c 8.7.  Anticipating increasing his CBGs due to the use of  his steroids.  70/30 discontinued on 2/22 for a safer  regimen while inpatient and using steroids. Continue levemir and SSI. Appreciate diabetes coordinator for recommendation. 7. Acute resp failure: in the setting of HCAP vs aspiration (last one been more likely). Cultures taken an pending, started on vanc and zosyn. Intubated for airway protection and proper ventilation. ABG demonstrating alkalosis, vent setting adjusted by pulmonary service here.  8. UTI: UA suggesting UTI. Zosyn will cover. Follow urine culture  9. Sepsis: in the setting of UTI/HCAP vs aspiration PNA. Fluids given, cultures taken and broad antibiotics initiated. Still spiked fever overnight. 10. V-Tach: in the setting of intubation/code blue. Electrolytes are stable. Troponin midlly elevated in setting of demand ischemia. Echo reassuring.  11. Presumed seizure: EEG ordered, neurology on board. Will follow rec's.  12. Stage 1 pressure injury: continue prevention measures, use overlay mattress and heel boot elevators.    DVT prophylaxis:Lovenox Code Status: Full code Family Communication: wife at bedside  Disposition Plan: will transfer to critical service at [facility]. Neurology consulted.  Consultants:   Neurology   Pulmonary/critical care service   Procedures:   Mechanically ventilated 11/20/17  Echo:  - Procedure narrative: Transthoracic echocardiography. Image   quality was suboptimal. - Left ventricle: The cavity size was normal. Wall thickness was   normal. Systolic function was vigorous. The estimated ejection   fraction was in the range of 65% to 70%. Wall motion was normal;   there were no regional wall motion abnormalities. Left   ventricular diastolic function parameters were normal.  Impressions: - Poor valvular visualization.  Antimicrobials:   None   Subjective: No using accessory muscles currently, mechanically ventilated and in no major distress. Spiked fever overnight and experienced what appears to be seizure activity.  Objective: Vitals:    11/21/17 0921 11/21/17 1000 11/21/17 1036 11/21/17 1101  BP:  111/69 112/64 116/70  Pulse: (!) 101 95 100 100  Resp: '18 20 19 15  ' Temp:      TempSrc:      SpO2: 100% 98% 99% 98%  Weight:      Height:        Intake/Output Summary (Last 24 hours) at 11/21/2017 1137 Last data filed at 11/21/2017 0800 Gross per 24 hour  Intake 820 ml  Output 2425 ml  Net -1605 ml   Filed Weights   11/19/17 0400 11/20/17 0500 11/21/17 0500  Weight: 76.8 kg (169 lb 5 oz) 72.5 kg (159 lb 13.3 oz) 75.5 kg (166 lb 7.2 oz)    Examination: General exam: spiked fever overnight; with concerns for seizure event. Moving upper limbs spontaneously, not following commands. Breathing unlabored. Patient sedated and mechanically ventilated. Respiratory system: scattered rhonchi, no wheezing, no crackles.  Cardiovascular system: S1 and S2, no rubs, no gallops, no murmur Gastrointestinal system: soft, NT, ND, positive BS Central nervous system: spontaneously moving upper extremities, no following commands currently. Patient sedated and mechanically ventilated (making assessment more difficulty and unreliable). Extremities:no edema, no cyanosis, no clubbing  Skin: stage 1 sacral pressure injury.   Data Reviewed: I have personally reviewed following labs and imaging studies  CBC: Recent Labs  Lab 11/16/17 0554 11/20/17 0155 11/21/17 0440  WBC 12.1* 4.3 5.8  HGB 12.9* 11.6* 9.2*  HCT 39.0 34.8* 29.1*  MCV 86.1 87.0 90.4  PLT 324 349 622   Basic Metabolic Panel: Recent Labs  Lab 11/15/17 0610 11/16/17 0554 11/17/17 0506 11/18/17 0421 11/20/17 0155 11/20/17 1042 11/20/17 1701 11/21/17 0440  NA 134* 131*  --  132* 139  --   --  139  K 4.7 4.3  --  4.5 4.7  --   --  4.0  CL 99* 97*  --  96* 98*  --   --  105  CO2 26 23  --  26 29  --   --  25  GLUCOSE 143* 204*  --  282* 240*  --   --  334*  BUN 19 36*  --  46* 54*  --   --  61*  CREATININE 0.82 0.77 0.70 0.70 0.89  --   --  1.09  CALCIUM 9.1 9.3  --   8.9 10.4*  --   --  9.1  MG  --   --   --   --   --  2.0 2.1 2.1  PHOS  --   --   --   --   --  2.4* 1.3* 1.7*   GFR: Estimated Creatinine Clearance: 74.1 mL/min (by C-G formula based on SCr of 1.09 mg/dL).   Cardiac Enzymes: Recent Labs  Lab 11/15/17 2116 11/20/17 1414 11/20/17 2128 11/21/17 0211  CKTOTAL 163  --   --   --   CKMB 2.9  --   --   --   TROPONINI  --  0.63* 0.96* 0.74*   CBG: Recent Labs  Lab 11/20/17 1633 11/20/17 2013 11/21/17 0108 11/21/17 0527 11/21/17 0751  GLUCAP 254* 244* 318* 329* 352*   Radiology Studies: Portable Chest Xray  Result Date: 11/21/2017 CLINICAL DATA:  Nasogastric tube placement. EXAM: PORTABLE CHEST 1 VIEW COMPARISON:  Abdominal radiograph November 20, 2016 FINDINGS: Nasogastric tube tip projects over the spine. Endotracheal tube tip projects 4.2 cm above the carina. Cardiomediastinal silhouette is normal. Calcified aortic arch. Patchy LEFT greater than RIGHT lower lobe airspace opacities without pleural effusion. No pneumothorax. Soft tissue planes and included osseous structures are unchanged. Old RIGHT clavicle fracture. IMPRESSION: Nasogastric tube tip projecting in distal stomach. Stable position of endotracheal tube. Patchy bibasilar airspace opacities concerning for pneumonia. Electronically Signed   By: Elon Alas M.D.   On: 11/21/2017 03:45   Portable Chest X-ray  Result Date: 11/20/2017 CLINICAL DATA:  Intubated EXAM: PORTABLE CHEST 1 VIEW COMPARISON:  Chest radiograph from earlier today. FINDINGS: Endotracheal tube tip is 4.5 cm above the carina. Enteric tube terminates in the lower thoracic esophagus. Stable cardiomediastinal silhouette with normal heart size. No pneumothorax. No pleural effusion. Patchy opacities at both lung bases, left greater than right, not appreciably changed. IMPRESSION: 1. Well-positioned endotracheal tube. 2. Enteric tube terminates in the lower thoracic esophagus and should be advanced 10 cm. 3.  Stable patchy bibasilar lung opacities, left greater than right, suspicious for pneumonia. These results were called by telephone at the time of interpretation on 11/20/2017 at 12:27 pm to [place], who verbally acknowledged these results. Electronically Signed   By: Ilona Sorrel M.D.   On: 11/20/2017 12:28   Dg Chest Port 1 View  Result Date: 11/20/2017 CLINICAL DATA:  62 year old male with altered mental status and sepsis. EXAM: PORTABLE CHEST 1 VIEW COMPARISON:  Chest radiograph dated 10/22/2015 FINDINGS: Left lung base atelectatic changes. Infiltrate is not excluded. Clinical correlation is recommended the right lung is clear. No pleural effusion or pneumothorax. The cardiac silhouette is within normal limits. No acute osseous pathology. IMPRESSION: Left lung base atelectasis. Infiltrate is not excluded. Clinical correlation is recommended. Electronically Signed   By: Anner Crete M.D.   On: 11/20/2017 02:04  Scheduled Meds: . acetaminophen  650 mg Per Tube Q8H  . chlorhexidine gluconate (MEDLINE KIT)  15 mL Mouth Rinse BID  . enoxaparin (LOVENOX) injection  40 mg Subcutaneous Q24H  . feeding supplement (PRO-STAT SUGAR FREE 64)  30 mL Per Tube BID  . feeding supplement (VITAL HIGH PROTEIN)  1,000 mL Per Tube Q24H  . insulin aspart  0-20 Units Subcutaneous Q4H  . insulin detemir  18 Units Subcutaneous BID  . mouth rinse  15 mL Mouth Rinse QID  . methylPREDNISolone (SOLU-MEDROL) injection  40 mg Intravenous Q12H  . pantoprazole (PROTONIX) IV  40 mg Intravenous Q24H  . phosphorus  500 mg Oral QID   Continuous Infusions: . IMMUNE GLOBULIN 10% (HUMAN) IV - For Fluid Restriction Only 30 g (11/21/17 1118)  . piperacillin-tazobactam (ZOSYN)  IV Stopped (11/21/17 1005)  . vancomycin 1,000 mg (11/21/17 0559)     LOS: 8 days    Time spent: 83 minutes    Barton Dubois, MD [facility] Pager [phone]  If 7PM-7AM, please contact  night-coverage [email or web address] Password [facility] 11/21/2017, 11:37 AM

## 2017-11-21 NOTE — Addendum Note (Signed)
Addendum  created 11/21/17 1314 by Ollen Bowl, CRNA   Intraprocedure Attestations filed

## 2017-11-21 NOTE — Progress Notes (Signed)
Inpatient Diabetes Program Recommendations  AACE/ADA: New Consensus Statement on Inpatient Glycemic Control (2015)  Target Ranges:  Prepandial:   less than 140 mg/dL      Peak postprandial:   less than 180 mg/dL (1-2 hours)      Critically ill patients:  140 - 180 mg/dL   Lab Results  Component Value Date   GLUCAP 352 (H) 11/21/2017   HGBA1C 9.1 (H) 11/12/2017    Review of Glycemic Control Results for MOUSTAFA, MOSSA (MRN [id number]) as of 11/21/2017 09:37  Ref. Range 11/20/2017 20:13 11/21/2017 01:08 11/21/2017 05:27 11/21/2017 07:51  Glucose-Capillary Latest Ref Range: 65 - 99 mg/dL 244 (H) 318 (H) 329 (H) 352 (H)    Diabetes history:Type 2 DM Outpatient Diabetes medications:Metformin 1,000 mg BID, Humalog 75/25 30-35 Units BID, Victoza 1.8 mg QD Current orders for Inpatient glycemic control: Novolog 0-20 Units Q4H, Levemir 18 Units BID   Inpatient Diabetes Program Recommendations:    Patient now intubated following dyspnea and weakness r/t Guillain Barre Syndrome. Noted increase to correction this AM.  Since tube feeds are continuing and patient is on Solumedrol 40 mg Q12H, please consider Novolog 6 Units Q4H for tube coverage. Also, consider increasing Levemir to 20 Units BID. Dosage will need to be adjusted as steroids are decreased.   Thanks, Bronson Curb, MSN, RNC-OB Diabetes Coordinator [phone] (8a-5p)

## 2017-11-21 NOTE — Consult Note (Signed)
PULMONARY / CRITICAL CARE MEDICINE   Name: Raymond [name] MRN: [id number] DOB: June 14, 1956    ADMISSION DATE:  11/21/2017 CONSULTATION DATE:  11/21/17  REFERRING MD:  Dr. Dyann Kief  CHIEF COMPLAINT:  Respiratory failure  HISTORY OF PRESENT ILLNESS:   HPI obtained from medical chart review as patient is currently intubated and sedated on mechanical ventilation.  62 year old male with past medical history of CAD, DMT2, HTN, HLD, MI, arthritis, anemia, and recent TURP secondary to bladder tumor admitted to [facility] on 2/14 with generalized weakness, pelvic pain, and numbness for 3 weeks.  Initially felt to be due to hyponatremia which was noted on admit, but symptoms persisted after sodium was corrected.  He had MRI that was negative. 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  Course complicated by sepsis from aspiration vs HCAP and UTI.  On 2/25, he was on BiPAP and due to concern for further decline and increased risk of aspiration, he was subsequently intubated.  He was then transferred to [facility] for further evaluation and management.    PAST MEDICAL HISTORY :  He  has a past medical history of Anemia, Arthritis, Coronary artery disease, Diabetes mellitus without complication ([place]), High cholesterol, Hypertension, and MI (myocardial infarction) ([place]) (2009).  PAST SURGICAL HISTORY: He  has a past surgical history that includes Cardiac catheterization; Coronary stent placement (2009); Knee arthroscopy (Left); Appendectomy; Colonoscopy; Tonsillectomy; Colonoscopy (N/A, 04/01/2017); Transurethral resection of bladder tumor (N/A, 04/11/2017); Cystoscopy w/ ureteral stent placement (Bilateral, 04/11/2017); Fracture surgery (Right); Transurethral resection of bladder tumor (N/A, 05/16/2017); Cystoscopy (N/A, 05/16/2017); and Inguinal hernia repair (Left, 08/15/2017).  No Known Allergies  No current facility-administered medications on  file prior to encounter.    Current Outpatient Medications on File Prior to Encounter  Medication Sig  . aspirin 325 MG tablet Take 325 mg by mouth at bedtime.   . celecoxib (CELEBREX) 200 MG capsule Take 200 mg by mouth daily as needed for mild pain.   . Choline Fenofibrate (FENOFIBRIC ACID) 135 MG CPDR Take 135 mg by mouth at bedtime.   Marland Kitchen CINNAMON PO Take 1,000 mg by mouth at bedtime.   . clopidogrel (PLAVIX) 75 MG tablet TAKE 1 TABLET DAILY (Patient taking differently: TAKE 1 TABLET AT BEDTIME)  . Coenzyme Q10 (COQ10) 100 MG CAPS Take 100 mg by mouth at bedtime.  Marland Kitchen ezetimibe (ZETIA) 10 MG tablet Take 10 mg by mouth at bedtime.   Marland Kitchen ibuprofen (ADVIL,MOTRIN) 200 MG tablet Take 400 mg every 8 (eight) hours as needed by mouth for mild pain.   Marland Kitchen insulin lispro protamine-lispro (HUMALOG 75/25 MIX) (75-25) 100 UNIT/ML SUSP injection Inject 30-35 Units into the skin 2 (two) times daily with a meal. Patient takes 35 units in the morning and 30 units at night  . metFORMIN (GLUCOPHAGE) 500 MG tablet Take 1,000 mg by mouth 2 (two) times daily with a meal.  . metoprolol succinate (TOPROL-XL) 25 MG 24 hr tablet Take 25 mg by mouth at bedtime.  . Multiple Vitamin (MULTIVITAMIN) tablet Take 1 tablet by mouth daily.  . naproxen sodium (ANAPROX) 220 MG tablet Take 440 mg by mouth daily as needed (pain).  . nitroGLYCERIN (NITROSTAT) 0.4 MG SL tablet Place 0.4 mg every 5 (five) minutes as needed under the tongue for chest pain.  . Omega-3 Fatty Acids (FISH OIL) 1200 MG CAPS Take 1,200 mg by mouth at bedtime.   . rosuvastatin (  CRESTOR) 40 MG tablet Take 40 mg by mouth at bedtime.   . sulfamethoxazole-trimethoprim (BACTRIM DS,SEPTRA DS) 800-160 MG tablet Take 1 tablet by mouth 2 (two) times daily.   . tamsulosin (FLOMAX) 0.4 MG CAPS capsule Take 0.4 mg by mouth at bedtime.  Marland Kitchen telmisartan (MICARDIS) 80 MG tablet Take 80 mg by mouth at bedtime.   . traMADol (ULTRAM) 50 MG tablet Take 50 mg by mouth every 8 (eight)  hours as needed for moderate pain.   . traZODone (DESYREL) 100 MG tablet Take 50-100 mg by mouth at bedtime as needed.   Marland Kitchen VICTOZA 18 MG/3ML SOPN Inject 1.8 mg into the skin daily.    FAMILY HISTORY:  His indicated that his mother is alive. He indicated that his father is deceased. He indicated that both of his sisters are alive. He indicated that his brother is deceased. He indicated that the status of his paternal grandfather is unknown. He indicated that his daughter is alive. He indicated that both of his sons are alive. He indicated that the status of his paternal uncle is unknown.   SOCIAL HISTORY: He  reports that  has never smoked. he has never used smokeless tobacco. He reports that he does not drink alcohol or use drugs.  REVIEW OF SYSTEMS:  Unable to obtain as pt is encephalopathic.  SUBJECTIVE:  On vent, does not follow commands.  VITAL SIGNS: BP 97/61   Pulse (!) 105   Temp (!) 101.7 F (38.7 C) (Axillary)   Resp 18   Ht 6\' 1"  (1.854 m)   Wt 166 lb 7.2 oz (75.5 kg)   SpO2 99%   BMI 21.96 kg/m   HEMODYNAMICS:    VENTILATOR SETTINGS: Vent Mode: PRVC FiO2 (%):  [40 %-70 %] 40 % Set Rate:  [14 bmp-18 bmp] 14 bmp Vt Set:  [640 mL] 640 mL PEEP:  [5 cmH20] 5 cmH20 Plateau Pressure:  [13 cmH20-19 cmH20] 13 cmH20  INTAKE / OUTPUT: I/O last 3 completed shifts: In: 1613.8 [I.V.:243.8; NG/GT:270; IV Piggyback:1100] Out: 1950 [IRCVE:9381]  PHYSICAL EXAMINATION: General: Adult male, resting in bed, in NAD. Neuro: Sedated, does not follow commands. HEENT: [place]/AT. EOMI, sclerae anicteric. Cardiovascular: Tachy, regular, no M/R/G.  Lungs: Respirations even and unlabored.  CTA bilaterally, No W/R/R. Abdomen: BS x 4, soft, NT/ND.  Musculoskeletal: No gross deformities, no edema.  Skin: Intact, warm, no rashes.   LABS:  BMET Recent Labs  Lab 11/18/17 0421 11/20/17 0155 11/21/17 0440  NA 132* 139 139  K 4.5 4.7 4.0  CL 96* 98* 105  CO2 26 29 25   BUN 46* 54*  61*  CREATININE 0.70 0.89 1.09  GLUCOSE 282* 240* 334*    Electrolytes Recent Labs  Lab 11/18/17 0421 11/20/17 0155 11/20/17 1042 11/20/17 1701 11/21/17 0440  CALCIUM 8.9 10.4*  --   --  9.1  MG  --   --  2.0 2.1 2.1  PHOS  --   --  2.4* 1.3* 1.7*    CBC Recent Labs  Lab 11/16/17 0554 11/20/17 0155 11/21/17 0440  WBC 12.1* 4.3 5.8  HGB 12.9* 11.6* 9.2*  HCT 39.0 34.8* 29.1*  PLT 324 349 306    Coag's No results for input(s): APTT, INR in the last 168 hours.  Sepsis Markers Recent Labs  Lab 11/20/17 0155  PROCALCITON 1.67    ABG Recent Labs  Lab 11/20/17 0408 11/20/17 1335 11/21/17 0433  PHART 7.499* 7.450 7.511*  PCO2ART 39.8 37.6 34.6  PO2ART 89.3 158*  98.3    Liver Enzymes Recent Labs  Lab 11/21/17 0440  AST 83*  ALT 42  ALKPHOS 36*  BILITOT 0.4  ALBUMIN 2.0*    Cardiac Enzymes Recent Labs  Lab 11/20/17 1414 11/20/17 2128 11/21/17 0211  TROPONINI 0.63* 0.96* 0.74*    Glucose Recent Labs  Lab 11/20/17 1633 11/20/17 2013 11/21/17 0108 11/21/17 0527 11/21/17 0751 11/21/17 1146  GLUCAP 254* 244* 318* 329* 352* 262*    Imaging Portable Chest Xray  Result Date: 11/21/2017 CLINICAL DATA:  Nasogastric tube placement. EXAM: PORTABLE CHEST 1 VIEW COMPARISON:  Abdominal radiograph November 20, 2016 FINDINGS: Nasogastric tube tip projects over the spine. Endotracheal tube tip projects 4.2 cm above the carina. Cardiomediastinal silhouette is normal. Calcified aortic arch. Patchy LEFT greater than RIGHT lower lobe airspace opacities without pleural effusion. No pneumothorax. Soft tissue planes and included osseous structures are unchanged. Old RIGHT clavicle fracture. IMPRESSION: Nasogastric tube tip projecting in distal stomach. Stable position of endotracheal tube. Patchy bibasilar airspace opacities concerning for pneumonia. Electronically Signed   By: Elon Alas M.D.   On: 11/21/2017 03:45     STUDIES:  EEG 2/25 > several  episodes of left temporal epileptiform activity, 1 sec burst suppression pattern. Echo 2/25 > EF 65-70%.  CULTURES: Urine 2/24 > pseudomonas >   ANTIBIOTICS: Vanc 2/24 >  Zosyn 2/24 >   SIGNIFICANT EVENTS: 2/14 > admit. 2/18 > neuro consult. 2/24 > intubated. 2/25 > transfer to [facility].  LINES/TUBES: ETT 2/24 >   DISCUSSION: 62 y.o. M admitted to [facility] 2/14 with generalized weakness.  Initially felt to be due to hyponatremia but no change after Na corrected.  Seen by neurology who felt that this could represent GBS.  He was started on IVIG x 5 days but did not have significant improvement.  On 2/24, he required intubation.  He was then transferred to [facility] for further management.  ASSESSMENT / PLAN:  PULMONARY A: Respiratory insufficiency - due to inability to protect the airway in setting of possible GBS. Concern for aspiration vs HCAP. P:   Continue full vent support. Daily SBT. If no improvement, might require trach. Continue empiric abx. Bronchial hygiene. Follow CXR.  CARDIOVASCULAR A:  Sinus tach. V.tach following intubation - resolved. Hx MI, HTN, HLD, CAD. P:  Lopressor PRN.  RENAL A:   Hypophosphatemia - s/p repletion. P:   Correct electrolytes as indicated. Follow BMP.  GASTROINTESTINAL A:   Nutrition. Protein calorie malnutrition. GI prophylaxis. P:   Tube feeds per nutrition. Might require PEG tube if no improvement. SUP: pantoprazole.  HEMATOLOGIC A:   Anemia. VTE prophylaxis. P:  Transfuse for Hgb < 7. SCD's / Lovenox. Follow CBC.  INFECTIOUS A:   Concern for aspiration vs HCAP. Possible UTI. P:   Continue vanc / zosyn. Follow cultures. Follow PCT.  ENDOCRINE A:   Hx DM. P:   Continue levemir, SSI.  NEUROLOGIC A:   Sedation due to mechanical ventilation. Concern for GBS - started on IVIG x 5 days but did not have improvement. Seizures - EEG 2/25 with several episodes of left temporal epileptiform activity, 1 sec burst  suppression pattern. P:   Sedation: Fentanyl PRN / Midazolam PRN. RASS goal: 0 to -1. Daily WUA. Neurology following, planing for an additional 5 days of IVIG. Continue steroids per neuro. Further recs per neuro. Continue empiric keppra. Might need to consider LP if no improvement.  UROLOGY A: S/p TURP. P: Outpatient f/u with urology.  FAMILY  - Updates: Wife,  daughter, sister all updated at bedside.  - Inter-disciplinary family meet or Palliative Care meeting due by:  December 25, 2017.  CC time: 35 min.   Montey Hora, [place] [facility] Pager: [phone]  or [phone] 11/21/2017, 10:51 PM

## 2017-11-21 NOTE — Consult Note (Signed)
NEURO HOSPITALIST CONSULT NOTE   Requestig physician: Dr. Elsworth Soho  Reason for Consult: GBS  History obtained from:  Chart   HPI:                                                                                                                                          Raymond [name] is an 62 y.o. male with recent TURP secondary to bladder tumor who presented to OSH on 2/14 with generalized weakness, pelvic pain and numbness for 3 weeks. He was noted to have significant generalized weakness with serum sodium of 122; he was placed on IV normal saline with improvement into the normal range. Due to concern for possible cauda equina syndrome, an MRI of the lumbar spine was performed demonstrating some lower lumbar findings, but no cauda equina abnormality. He continued to have ongoing pain as well as significant weakness that could not be further attributed to hyponatremia given normalization.  Neurology was consulted at OSH with further MRI imaging performed on 11/15/17 with no major acute abnormalities noted; but suggesting disc protrusion at L5-S1 level. Neurology recommended another 5 days of IVIG.   On 2/24 at night, the patient experienced a rigid episode with eyes rolling back, suggesting seizure activity. EEG was ordered demonstrating left temporal lobe epileptiform activity.   Patient's case was complicated with sepsis from aspiration PNA vs HCAP and UTI. On 2/25, he was on BiPAP and due to concern for further decline and increased risk of aspiration, he was subsequently intubated. He had short episode of V-tach during intubation that triggered code blue, with patient successfully resuscitated.   His problem list includes the following: Acute resp failure with hypoxia, HCAP vs aspiration PNA, UTI, sepsis, dysphagia with severe protein-calorie malnutrition, generalized progressive weakness with concerns for GBS, seizure, pelvic floor pain-ongoing, constipation, status post  transurethral resection of bladder tumor, diabetes with A1c 8.7, and stage 1 pressure injury.   Past Medical History:  Diagnosis Date  . Anemia   . Arthritis    Hands and left knee  . Coronary artery disease   . Diabetes mellitus without complication ([place])   . High cholesterol   . Hypertension   . MI (myocardial infarction) ([place]) 2009    Past Surgical History:  Procedure Laterality Date  . APPENDECTOMY    . CARDIAC CATHETERIZATION    . COLONOSCOPY    . COLONOSCOPY N/A 04/01/2017   Procedure: COLONOSCOPY;  Surgeon: Daneil Dolin, MD;  Location: [facility];  Service: Endoscopy;  Laterality: N/A;  9:45 AM  . CORONARY STENT PLACEMENT  2009   x3  . CYSTOSCOPY N/A 05/16/2017   Procedure: CYSTOSCOPY;  Surgeon: Cleon Gustin, MD;  Location: [facility];  Service: Urology;  Laterality: N/A;  . CYSTOSCOPY W/ URETERAL STENT  PLACEMENT Bilateral 04/11/2017   Procedure: CYSTOSCOPY WITH RETROGRADE PYELOGRAM;  Surgeon: Cleon Gustin, MD;  Location: [facility];  Service: Urology;  Laterality: Bilateral;  . FRACTURE SURGERY Right    Steel Rod in femur  . INGUINAL HERNIA REPAIR Left 08/15/2017   Procedure: HERNIA REPAIR INGUINAL ADULT WITH MESH;  Surgeon: Aviva Signs, MD;  Location: [facility];  Service: General;  Laterality: Left;  . KNEE ARTHROSCOPY Left   . TONSILLECTOMY    . TRANSURETHRAL RESECTION OF BLADDER TUMOR N/A 04/11/2017   Procedure: TRANSURETHRAL RESECTION OF BLADDER TUMOR (TURBT);  Surgeon: Cleon Gustin, MD;  Location: [facility];  Service: Urology;  Laterality: N/A;  1 [id number]  . TRANSURETHRAL RESECTION OF BLADDER TUMOR N/A 05/16/2017   Procedure: TRANSURETHRAL RESECTION OF BLADDER TUMOR (TURBT);  Surgeon: Cleon Gustin, MD;  Location: [facility];  Service: Urology;  Laterality: N/A;    Family History  Problem Relation Age of Onset  . Diabetes Mother   . Heart attack Mother   . Cancer Father        lung  . Stroke Paternal Grandfather    . Colon cancer Paternal Uncle    Social History:  reports that  has never smoked. he has never used smokeless tobacco. He reports that he does not drink alcohol or use drugs.  No Known Allergies  MEDICATIONS:                                                                                                                     Scheduled: . acetaminophen  650 mg Per Tube Q8H  . chlorhexidine gluconate (MEDLINE KIT)  15 mL Mouth Rinse BID  . enoxaparin (LOVENOX) injection  40 mg Subcutaneous Q24H  . feeding supplement (PRO-STAT SUGAR FREE 64)  30 mL Per Tube BID  . feeding supplement (VITAL HIGH PROTEIN)  1,000 mL Per Tube Q24H  . insulin aspart  0-20 Units Subcutaneous Q4H  . insulin detemir  18 Units Subcutaneous BID  . mouth rinse  15 mL Mouth Rinse 10 times per day  . methylPREDNISolone (SOLU-MEDROL) injection  40 mg Intravenous Q12H  . pantoprazole (PROTONIX) IV  40 mg Intravenous Q24H  . phosphorus  500 mg Oral QID   Continuous: . IMMUNE GLOBULIN 10% (HUMAN) IV - For Fluid Restriction Only Stopped (11/21/17 1445)  . levETIRAcetam    . piperacillin-tazobactam (ZOSYN)  IV Stopped (11/21/17 1736)  . vancomycin Stopped (11/21/17 1838)   YJE:HUDJSHFWYOVZC, albuterol, bisacodyl, fentaNYL (SUBLIMAZE) injection, fentaNYL (SUBLIMAZE) injection, hydrALAZINE, ketorolac, midazolam, midazolam, ondansetron **OR** ondansetron (ZOFRAN) IV, phenol  ROS:  Unable to obtain due to severe obtundation.    Blood pressure 97/74, pulse (!) 130, temperature 98.2 F (36.8 C), temperature source Axillary, resp. rate 18, height _0  (1.854 m), weight 75.5 kg (166 lb 7.2 oz), SpO2 100 %.   General Examination:                                                                                                       Physical Exam  HEENT-  [place]/AT. Nuchal rigidity is noted.  Lungs-  Intubated Extremities- Warm and well perfused. Patient flexes neck and becomes agitated when assessing for Kernig sign with LLE. No Kernig sign RLE.   Neurological Examination Mental Status: Severely obtunded. Localizes sternal rub with each upper extremity. Not following commands. No attempts to communicate. Does not gaze to or away from visual stimuli.  Cranial Nerves: II: Blinks to threat bilaterally  III,IV, VI:  Conjugate roving EOM noted spontaneously. No nystagmus.  V,VII: Grimaces equally to brow ridge pressure.  VIII: No response to auditory stimuli IX,X: Intubated XI: Unable to assess shoulder shrug XII: Intubated Motor/Sensory: Moves upper extremities equally while localizing to sternal rub; strength 4/5 No movement of lower extremities to any noxious stimulus, except for transient weak medial rotation of RLE.  Flaccid tone right lower extremity. Increased extensor tone LLE.  Deep Tendon Reflexes:  Absent brachioradialis bilaterally. Absent biceps on left. 1+ biceps on right.  4+ left patellar with low amplitude response and crossed adductor on the right 0 right patellae and bilateral achilles.  Plantars: Mute bilaterally Cerebellar/Gait: Unable to assess. Other: No adventitious movements consistent with seizure are seen on exam.    Lab Results: Basic Metabolic Panel: Recent Labs  Lab 11/15/17 0610 11/16/17 0554 11/17/17 0506 11/18/17 0421 11/20/17 0155 11/20/17 1042 11/20/17 1701 11/21/17 0440  NA 134* 131*  --  132* 139  --   --  139  K 4.7 4.3  --  4.5 4.7  --   --  4.0  CL 99* 97*  --  96* 98*  --   --  105  CO2 26 23  --  26 29  --   --  25  GLUCOSE 143* 204*  --  282* 240*  --   --  334*  BUN 19 36*  --  46* 54*  --   --  61*  CREATININE 0.82 0.77 0.70 0.70 0.89  --   --  1.09  CALCIUM 9.1 9.3  --  8.9 10.4*  --   --  9.1  MG  --   --   --   --   --  2.0 2.1 2.1  PHOS  --   --   --   --   --  2.4* 1.3* 1.7*    CBC: Recent Labs  Lab 11/16/17 0554  11/20/17 0155 11/21/17 0440  WBC 12.1* 4.3 5.8  HGB 12.9* 11.6* 9.2*  HCT 39.0 34.8* 29.1*  MCV 86.1 87.0 90.4  PLT 324 349 306    Cardiac Enzymes: Recent Labs  Lab 11/15/17 2116 11/20/17 1414  11/20/17 2128 11/21/17 0211  CKTOTAL 163  --   --   --   CKMB 2.9  --   --   --   TROPONINI  --  0.63* 0.96* 0.74*    Lipid Panel: No results for input(s): CHOL, TRIG, HDL, CHOLHDL, VLDL, LDLCALC in the last 168 hours.  Imaging: Portable Chest Xray  Result Date: 11/21/2017 CLINICAL DATA:  Nasogastric tube placement. EXAM: PORTABLE CHEST 1 VIEW COMPARISON:  Abdominal radiograph November 20, 2016 FINDINGS: Nasogastric tube tip projects over the spine. Endotracheal tube tip projects 4.2 cm above the carina. Cardiomediastinal silhouette is normal. Calcified aortic arch. Patchy LEFT greater than RIGHT lower lobe airspace opacities without pleural effusion. No pneumothorax. Soft tissue planes and included osseous structures are unchanged. Old RIGHT clavicle fracture. IMPRESSION: Nasogastric tube tip projecting in distal stomach. Stable position of endotracheal tube. Patchy bibasilar airspace opacities concerning for pneumonia. Electronically Signed   By: Elon Alas M.D.   On: 11/21/2017 03:45   Portable Chest X-ray  Result Date: 11/20/2017 CLINICAL DATA:  Intubated EXAM: PORTABLE CHEST 1 VIEW COMPARISON:  Chest radiograph from earlier today. FINDINGS: Endotracheal tube tip is 4.5 cm above the carina. Enteric tube terminates in the lower thoracic esophagus. Stable cardiomediastinal silhouette with normal heart size. No pneumothorax. No pleural effusion. Patchy opacities at both lung bases, left greater than right, not appreciably changed. IMPRESSION: 1. Well-positioned endotracheal tube. 2. Enteric tube terminates in the lower thoracic esophagus and should be advanced 10 cm. 3. Stable patchy bibasilar lung opacities, left greater than right, suspicious for pneumonia. These results were called by  telephone at the time of interpretation on 11/20/2017 at 12:27 pm to [place], who verbally acknowledged these results. Electronically Signed   By: Ilona Sorrel M.D.   On: 11/20/2017 12:28   Dg Chest Port 1 View  Result Date: 11/20/2017 CLINICAL DATA:  62 year old male with altered mental status and sepsis. EXAM: PORTABLE CHEST 1 VIEW COMPARISON:  Chest radiograph dated 10/22/2015 FINDINGS: Left lung base atelectatic changes. Infiltrate is not excluded. Clinical correlation is recommended the right lung is clear. No pleural effusion or pneumothorax. The cardiac silhouette is within normal limits. No acute osseous pathology. IMPRESSION: Left lung base atelectasis. Infiltrate is not excluded. Clinical correlation is recommended. Electronically Signed   By: Anner Crete M.D.   On: 11/20/2017 02:04   EEG: 1.  Several episodes of left temporal epileptiform activities.  Low-dose Keppra will be initiated. 2.  1 sec burst suppression pattern. 3.  Moderate generalized slowing indicating a moderate generalized encephalopathy.  MRI/MRA brain 2/19: Generalized brain atrophy, progressive since the CT scan of 2010. There are only minimal small vessel changes of the hemispheric white matter. Some layering material in the occipital horns of lateral ventricles, probably blood products related to recent falls described clinically. Differential diagnosis does include debris secondary to meningitis, but that seems less likely. Negative intracranial MR angiography of the large and medium size vessels. 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  MRI cervical and thoracic spine, 2/19: Cervical spine: No acute or traumatic finding. Right-sided spondylosis at C3-4 with mild to moderate right foraminal narrowing. Thoracic spine: No acute or traumatic finding. Central disc herniations at T7-8 and T8-9 without likely neural  compression. Central to right-sided disc herniation at T9-10 without likely neural compression. These could certainly be associated with back pain. T10-11 central disc herniation with upward migration behind T10 with some indentation of the  left side of the cord. Ample subarachnoid space present dorsal to the cord, therefore cord compression is unlikely. This could also be associated with back pain however.  MRI L-spine 2/15:  1. Right paramedian disc protrusion and annular tear at L4-5 with potential impact on the traversing right L5 nerve roots. 2. Shallow central disc protrusion and annular tear at L5-S1 without focal stenosis.  Assessment: 62 year old male with multiple medical problems including generalized progressive weakness concerning for GBS and seizure during admission.  1. Seizure: EEG showed left temporal lobe epileptiform activity and 1 second burst suppression pattern with moderate generalized slowing. On Keppra.  2. Generalized progressive weakness with concerns for GBS (empirically completed 5 days of IVIG). Given deterioration neurology at OSH had recommended repeat 5 more days of IVIG. Areflexia of upper extremities except for 1+ right biceps. Areflexia of lower extremities is noted, except for 4+ left patellar with crossed adductor to the right, which suggests possible new brain lesion not seen on prior MRI. Previous cervical, thoracic and lumbar spine MRIs showed no etiology for his lower extremity weakness.   3. Nuchal rigidity on exam. Patient flexes neck and becomes agitated when assessing for Kernig sign with LLE. No Kernig sign RLE. Suspect meningitis.  4. Encephalopathy following Code Blue with resuscitation, which occurred during intubation for acute respiratory failure. DDx includes anoxic brain injury. However, given meningismus on exam, a meningitis or viral encephalitis is also on the DDx.  5. Multiple medical comorbidities including the following: His problem list  includes the following: Acute resp failure with hypoxia, HCAP vs aspiration PNA, UTI, sepsis, dysphagia with severe protein-calorie malnutrition, generalized progressive weakness with concerns for GBS, seizure, pelvic floor pain-ongoing, constipation, status post transurethral resection of bladder tumor, diabetes with A1c 8.7, and stage 1 pressure injury.   Recommendations: 1. Continue Keppra 500 mg IV BID. Repeat EEG in AM. 2. Continue second round of IVIG treatment x 5 days (first day of second round was today).  3. I have called pharmacy regarding dosing for meningitis: Cefepime is being started with Zosyn being discontinued as cefepime will also cover the organism isolated from his sputum. Also starting empiric ampicillin and acyclovir IV. Pharmacy will review vancomycin dose and change as needed for meningitis dosing.  4. Will need LP under fluroscopy with sedation. Bedside LP not feasible given body habitus and agitation.  Other medical management as per primary team. 5. Cannot obtain inpatient EMG/NCS. May need as an outpatient.  5. Repeat MRI brain. Obtain with contrast.   45 minutes spent in the Neurological evaluation and management of this critically ill patient  Electronically signed: Dr. Kerney Elbe 11/21/2017, 9:47 PM

## 2017-11-21 NOTE — Progress Notes (Signed)
Pharmacy Antibiotic Note  Raymond [name] is a 62 y.o. male admitted on 11/21/2017 with sepsis.  Pharmacy has been consulted for Vancomycin and Zosyn dosing.  Plan:  Vancomycin 1000mg  IV q12h Check trough at steady state Zosyn 3.375gm IV q8h, EID Monitor labs, renal fxn, progress and c/s Deescalate ABX when improved / appropriate.    Antimicrobials this admission: Vancomycin 2/24 >>  Zosyn 2/24 >>   Dose adjustments this admission:  Microbiology results: 2/24 BCx: pending 2/24 UCx: pseudomonas aeruginosa  2/19 MRSA PCR: negative  Height: 6\' 1"  (185.4 cm) Weight: 166 lb 7.2 oz (75.5 kg) IBW/kg (Calculated) : 79.9  Temp (24hrs), Avg:101.1 F (38.4 C), Min:100.1 F (37.8 C), Max:102 F (38.9 C)  Recent Labs  Lab 11/16/17 0554 11/17/17 0506 11/18/17 0421 11/20/17 0155 11/21/17 0440  WBC 12.1*  --   --  4.3 5.8  CREATININE 0.77 0.70 0.70 0.89 1.09    Estimated Creatinine Clearance: 74.1 mL/min (by C-G formula based on SCr of 1.09 mg/dL).    No Known Allergies  Thank you for allowing pharmacy to be a part of this patient's care.  Hart Robinsons A 11/21/2017 12:44 PM

## 2017-11-21 NOTE — Progress Notes (Signed)
eLink Physician-Brief Progress Note Patient Name: Raymond [name] DOB: [birth year] MRN: [id number]   Date of Service  11/21/2017  HPI/Events of Note  Patient has orders for BiPAP and is now intubated and mechanically ventilated.   eICU Interventions  Will D/C orders for BiPAP.      Intervention Category Major Interventions: Respiratory failure - evaluation and management  Sommer,Steven Eugene 11/21/2017, 12:58 AM

## 2017-11-21 NOTE — Progress Notes (Signed)
Decreased rate after morning ABG , Ph 7.511, PCO2 34.6. -- down to 14. Patient is setting rate at 17 to 18. Also noted at 4 am patient appeared to have seizure turned in bed opened eyes became ridged. Slight tremor. Heart rate increased for short period to 160's. Would not wake after. Nurse notified and noted. Also on rhythm strip.Respirations increased.

## 2017-11-21 NOTE — Progress Notes (Signed)
SLP Cancellation Note  Patient Details Name: Raymond [name] MRN: [id number] DOB: 07/06/1956   Cancelled treatment:       Reason Eval/Treat Not Completed: Patient not medically ready;Medical issues which prohibited therapy; Pt now intubated and with change in medical status. He will be transferred to [facility]. SLP will sign off.   Thank you,  Genene Churn, [place]    [place] 11/21/2017, 1:13 PM

## 2017-11-21 NOTE — Progress Notes (Signed)
Night shift ICU coverage note.  The patient CBG was reported to be 318 mg/dL.  He is currently n.p.o., but getting per tube feedings 40 mL/h.  He is also on methylprednisolone 40 mg IVP every 12 hours.  He has 3 times daily coverage, but no meals coverage.  Since this is continuous feeding, will give a dose now and change sliding scale insulin coverage to every 6 hours.  Phosphorus has been supplemented through PEG tube as well.  Tennis Must, MD

## 2017-11-21 NOTE — Progress Notes (Signed)
EEG completed; results pending.    

## 2017-11-21 NOTE — Procedures (Signed)
Raymond A. Raymond Laughter, MD     [email or web address]           HISTORY: This is a 62 year old who presents with confusion and altered mental status.  This has been to evaluate for seizures as an etiology of the confusion.  MEDICATIONS: Scheduled Meds: . acetaminophen  650 mg Per Tube Q8H  . chlorhexidine gluconate (MEDLINE KIT)  15 mL Mouth Rinse BID  . enoxaparin (LOVENOX) injection  40 mg Subcutaneous Q24H  . feeding supplement (PRO-STAT SUGAR FREE 64)  30 mL Per Tube BID  . feeding supplement (VITAL HIGH PROTEIN)  1,000 mL Per Tube Q24H  . insulin aspart  0-20 Units Subcutaneous Q4H  . insulin detemir  18 Units Subcutaneous BID  . mouth rinse  15 mL Mouth Rinse 10 times per day  . methylPREDNISolone (SOLU-MEDROL) injection  40 mg Intravenous Q12H  . pantoprazole (PROTONIX) IV  40 mg Intravenous Q24H  . phosphorus  500 mg Oral QID   Continuous Infusions: . IMMUNE GLOBULIN 10% (HUMAN) IV - For Fluid Restriction Only Stopped (11/21/17 1445)  . piperacillin-tazobactam (ZOSYN)  IV 3.375 g (11/21/17 1423)  . vancomycin 1,000 mg (11/21/17 0559)   PRN Meds:.acetaminophen, albuterol, bisacodyl, fentaNYL (SUBLIMAZE) injection, fentaNYL (SUBLIMAZE) injection, hydrALAZINE, ketorolac, midazolam, midazolam, ondansetron **OR** ondansetron (ZOFRAN) IV, phenol  Prior to Admission medications   Medication Sig Start Date End Date Taking? Authorizing Provider  aspirin 325 MG tablet Take 325 mg by mouth at bedtime.    Yes [provider]  celecoxib (CELEBREX) 200 MG capsule Take 200 mg by mouth daily as needed for mild pain.    Yes [provider]  Choline Fenofibrate (FENOFIBRIC ACID) 135 MG CPDR Take 135 mg by mouth at bedtime.    Yes [provider]  CINNAMON PO Take 1,000 mg by mouth at bedtime.    Yes [provider]  clopidogrel (PLAVIX) 75 MG tablet TAKE 1 TABLET DAILY Patient taking differently: TAKE 1 TABLET AT BEDTIME 02/23/13  Yes  Lorretta Harp, MD  Coenzyme Q10 (COQ10) 100 MG CAPS Take 100 mg by mouth at bedtime.   Yes [provider]  ezetimibe (ZETIA) 10 MG tablet Take 10 mg by mouth at bedtime.    Yes [provider]  ibuprofen (ADVIL,MOTRIN) 200 MG tablet Take 400 mg every 8 (eight) hours as needed by mouth for mild pain.    Yes [provider]  insulin lispro protamine-lispro (HUMALOG 75/25 MIX) (75-25) 100 UNIT/ML SUSP injection Inject 30-35 Units into the skin 2 (two) times daily with a meal. Patient takes 35 units in the morning and 30 units at night   Yes [provider]  metFORMIN (GLUCOPHAGE) 500 MG tablet Take 1,000 mg by mouth 2 (two) times daily with a meal. 03/09/17  Yes [provider]  metoprolol succinate (TOPROL-XL) 25 MG 24 hr tablet Take 25 mg by mouth at bedtime. 02/08/17  Yes [provider]  Multiple Vitamin (MULTIVITAMIN) tablet Take 1 tablet by mouth daily.   Yes [provider]  naproxen sodium (ANAPROX) 220 MG tablet Take 440 mg by mouth daily as needed (pain).   Yes [provider]  nitroGLYCERIN (NITROSTAT) 0.4 MG SL tablet Place 0.4 mg every 5 (five) minutes as needed under the tongue for chest pain.   Yes [provider]  Omega-3 Fatty Acids (FISH OIL) 1200 MG CAPS Take 1,200 mg by mouth at bedtime.    Yes [provider]  rosuvastatin (CRESTOR) 40 MG  tablet Take 40 mg by mouth at bedtime.    Yes [provider]  sulfamethoxazole-trimethoprim (BACTRIM DS,SEPTRA DS) 800-160 MG tablet Take 1 tablet by mouth 2 (two) times daily.  11/02/17  Yes [provider]  tamsulosin (FLOMAX) 0.4 MG CAPS capsule Take 0.4 mg by mouth at bedtime.   Yes [provider]  telmisartan (MICARDIS) 80 MG tablet Take 80 mg by mouth at bedtime.    Yes [provider]  traMADol (ULTRAM) 50 MG tablet Take 50 mg by mouth every 8 (eight) hours as needed for moderate pain.  11/07/17  Yes [provider]  traZODone (DESYREL) 100 MG tablet Take 50-100 mg by mouth at bedtime as needed.  11/07/17  Yes [provider]  VICTOZA 18 MG/3ML SOPN Inject 1.8 mg into the skin daily. 03/03/17  Yes [provider]      ANALYSIS: A 16 channel recording using standard 10 20 measurements is conducted for 22 minutes.   The posterior background activity gets as high as 5-1/2 hertz.  There is a few sharp wave activity observed involving the left temporal region as phase reverse at T5.   There is also several episodes of 1 sec burst suppression pattern.  Focal slowing or lateral slowing of observed.  No electrographic seizures are observed.   IMPRESSION:   This recording is abnormal for the following reasons:  1.    Several episodes of left temporal epileptiform activities.   Low-dose Keppra will be initiated. 2.   1 sec burst suppression pattern. 3.   Moderate generalized slowing indicating a moderate generalized encephalopathy.      Raymond [name], M.D.  Diplomate, Tax adviser of Psychiatry and Neurology ( Neurology).

## 2017-11-21 NOTE — Progress Notes (Signed)
*  PRELIMINARY RESULTS* Echocardiogram 2D Echocardiogram has been performed.  Leavy Cella 11/21/2017, 10:16 AM

## 2017-11-21 NOTE — Progress Notes (Signed)
Care link came for transport. Called Report to RN on 4N at [facility]. Wife is transporting patients belongings.

## 2017-11-21 NOTE — Progress Notes (Signed)
Subjective: Events of the night noted.  He had significant cardiac arrhythmias.  He had been pretty much unresponsive and had an episode of having his eyes wide open.  Electrolytes with the exception of phosphorus were okay.  Echocardiogram is pending.  His pH was high and his ventilator has been adjusted.  Objective: Vital signs in last 24 hours: Temp:  [98.9 F (37.2 C)-101.2 F (38.4 C)] 100.1 F (37.8 C) (02/25 0400) Pulse Rate:  [96-119] 100 (02/25 0630) Resp:  [15-35] 17 (02/25 0630) BP: (91-150)/(61-127) 104/65 (02/25 0630) SpO2:  [82 %-100 %] 97 % (02/25 0630) FiO2 (%):  [40 %-70 %] 40 % (02/25 0553) Weight:  [75.5 kg (166 lb 7.2 oz)] 75.5 kg (166 lb 7.2 oz) (02/25 0500) Weight change: 3 kg (6 lb 9.8 oz) Last BM Date: 11/12/17  Intake/Output from previous day: 02/24 0701 - 02/25 0700 In: 820 [NG/GT:270; IV Piggyback:550] Out: 1200 [Urine:1200]  PHYSICAL EXAM General appearance: Intubated sedated on mechanical ventilation Resp: clear to auscultation bilaterally Cardio: regular rate and rhythm, S1, S2 normal, no murmur, click, rub or gallop GI: soft, non-tender; bowel sounds normal; no masses,  no organomegaly Extremities: extremities normal, atraumatic, no cyanosis or edema Not very responsive now.  However he is sedated skin turgor okay  Lab Results:  Results for orders placed or performed during the hospital encounter of 11/18/2017 (from the past 48 hour(s))  Glucose, capillary     Status: Abnormal   Collection Time: 11/19/17  8:32 AM  Result Value Ref Range   Glucose-Capillary 292 (H) 65 - 99 mg/dL  Glucose, capillary     Status: Abnormal   Collection Time: 11/19/17 11:56 AM  Result Value Ref Range   Glucose-Capillary 222 (H) 65 - 99 mg/dL  Blood gas, arterial     Status: Abnormal   Collection Time: 11/19/17  1:12 PM  Result Value Ref Range   O2 Content 2.0 L/min   pH, Arterial 7.458 (H) 7.350 - 7.450   pCO2 arterial 43.4 32.0 - 48.0 mmHg   pO2, Arterial 71.2  (L) 83.0 - 108.0 mmHg   Bicarbonate 29.9 (H) 20.0 - 28.0 mmol/L   Acid-Base Excess 6.3 (H) 0.0 - 2.0 mmol/L   O2 Saturation 94.2 %   Patient temperature 37.0    Collection site RIGHT RADIAL    Drawn by [phone]    Sample type ARTERIAL    Allens test (pass/fail) PASS PASS    Comment: Performed at [facility], [address], [place], [place] [address]  Glucose, capillary     Status: Abnormal   Collection Time: 11/19/17  6:03 PM  Result Value Ref Range   Glucose-Capillary 305 (H) 65 - 99 mg/dL  Basic metabolic panel     Status: Abnormal   Collection Time: 11/20/17  1:55 AM  Result Value Ref Range   Sodium 139 135 - 145 mmol/L    Comment: DELTA CHECK NOTED   Potassium 4.7 3.5 - 5.1 mmol/L   Chloride 98 (L) 101 - 111 mmol/L   CO2 29 22 - 32 mmol/L   Glucose, Bld 240 (H) 65 - 99 mg/dL   BUN 54 (H) 6 - 20 mg/dL   Creatinine, Ser 0.89 0.61 - 1.24 mg/dL   Calcium 10.4 (H) 8.9 - 10.3 mg/dL   GFR calc non Af Amer >60 >60 mL/min   GFR calc Af Amer >60 >60 mL/min    Comment: (NOTE) The eGFR has been calculated using the CKD EPI equation. This calculation has not  been validated in all clinical situations. eGFR's persistently <60 mL/min signify possible Chronic Kidney Disease.    Anion gap 12 5 - 15    Comment: Performed at [facility], [address], [place], [place] [address]  CBC     Status: Abnormal   Collection Time: 11/20/17  1:55 AM  Result Value Ref Range   WBC 4.3 4.0 - 10.5 K/uL   RBC 4.00 (L) 4.22 - 5.81 MIL/uL   Hemoglobin 11.6 (L) 13.0 - 17.0 g/dL   HCT 34.8 (L) 39.0 - 52.0 %   MCV 87.0 78.0 - 100.0 fL   MCH 29.0 26.0 - 34.0 pg   MCHC 33.3 30.0 - 36.0 g/dL   RDW 13.9 11.5 - 15.5 %   Platelets 349 150 - 400 K/uL    Comment: Performed at [facility], [address], [place], [place] [address]  Culture, blood (x 2)     Status: None (Preliminary result)   Collection Time: 11/20/17  1:55 AM  Result Value Ref Range   Specimen Description BLOOD LEFT ARM    Special Requests       BOTTLES DRAWN AEROBIC AND ANAEROBIC Blood Culture adequate volume   Culture      NO GROWTH < 12 HOURS Performed at [facility], [address] [place], [place] [address]    Report Status PENDING   Procalcitonin     Status: None   Collection Time: 11/20/17  1:55 AM  Result Value Ref Range   Procalcitonin 1.67 ng/mL    Comment:        Interpretation: PCT > 0.5 ng/mL and <= 2 ng/mL: Systemic infection (sepsis) is possible, but other conditions are known to elevate PCT as well. (NOTE)       Sepsis PCT Algorithm           Lower Respiratory Tract                                      Infection PCT Algorithm    ----------------------------     ----------------------------         PCT < 0.25 ng/mL                PCT < 0.10 ng/mL         Strongly encourage             Strongly discourage   discontinuation of antibiotics    initiation of antibiotics    ----------------------------     -----------------------------       PCT 0.25 - 0.50 ng/mL            PCT 0.10 - 0.25 ng/mL               OR       >80% decrease in PCT            Discourage initiation of                                            antibiotics      Encourage discontinuation           of antibiotics    ----------------------------     -----------------------------         PCT >= 0.50 ng/mL  PCT 0.26 - 0.50 ng/mL                AND       <80% decrease in PCT             Encourage initiation of                                             antibiotics       Encourage continuation           of antibiotics    ----------------------------     -----------------------------        PCT >= 0.50 ng/mL                  PCT > 0.50 ng/mL               AND         increase in PCT                  Strongly encourage                                      initiation of antibiotics    Strongly encourage escalation           of antibiotics                                     -----------------------------                                            PCT <= 0.25 ng/mL                                                 OR                                        > 80% decrease in PCT                                     Discontinue / Do not initiate                                             antibiotics Performed at [facility], [address] [place], [place] [address]   Culture, blood (x 2)     Status: None (Preliminary result)   Collection Time: 11/20/17  2:10 AM  Result Value Ref Range   Specimen Description BLOOD RIGHT ARM    Special Requests      BOTTLES DRAWN AEROBIC AND ANAEROBIC Blood Culture adequate volume DRAWN BY RN   Culture      NO GROWTH < 12 HOURS Performed at  [address] [place], [place] [address]    Report Status PENDING   Blood gas, arterial     Status: Abnormal   Collection Time: 11/20/17  4:08 AM  Result Value Ref Range   FIO2 60.00    Delivery systems BILEVEL POSITIVE AIRWAY PRESSURE    LHR 8 resp/min   Inspiratory PAP 14    Expiratory PAP 6    pH, Arterial 7.499 (H) 7.350 - 7.450   pCO2 arterial 39.8 32.0 - 48.0 mmHg   pO2, Arterial 89.3 83.0 - 108.0 mmHg   Bicarbonate 30.9 (H) 20.0 - 28.0 mmol/L   Acid-Base Excess 7.1 (H) 0.0 - 2.0 mmol/L   O2 Saturation 96.8 %   Collection site RADIAL    Drawn by [id number]    Sample type ARTERIAL    Allens test (pass/fail) PASS PASS   Mechanical Rate 8     Comment: Performed at [facility], [address] [place], [place] [address]  Urinalysis, Routine w reflex microscopic     Status: Abnormal   Collection Time: 11/20/17  4:39 AM  Result Value Ref Range   Color, Urine YELLOW YELLOW   APPearance CLEAR CLEAR   Specific Gravity, Urine 1.024 1.005 - 1.030   pH 5.0 5.0 - 8.0   Glucose, UA 150 (A) NEGATIVE mg/dL   Hgb urine dipstick SMALL (A) NEGATIVE   Bilirubin Urine NEGATIVE NEGATIVE   Ketones, ur NEGATIVE NEGATIVE mg/dL   Protein, ur 30 (A) NEGATIVE mg/dL   Nitrite POSITIVE (A) NEGATIVE   Leukocytes, UA TRACE (A) NEGATIVE    RBC / HPF 0-5 0 - 5 RBC/hpf   WBC, UA 6-30 0 - 5 WBC/hpf   Bacteria, UA NONE SEEN NONE SEEN   Squamous Epithelial / LPF NONE SEEN NONE SEEN   Mucus PRESENT    Hyaline Casts, UA PRESENT     Comment: Performed at [facility], [address], [place], [place] [address]  Influenza panel by PCR (type A & B)     Status: None   Collection Time: 11/20/17  4:39 AM  Result Value Ref Range   Influenza A By PCR NEGATIVE NEGATIVE   Influenza B By PCR NEGATIVE NEGATIVE    Comment: (NOTE) The Xpert Xpress Flu assay is intended as an aid in the diagnosis of  influenza and should not be used as a sole basis for treatment.  This  assay is FDA approved for nasopharyngeal swab specimens only. Nasal  washings and aspirates are unacceptable for Xpert Xpress Flu testing. Performed at [facility], [address] [place], [place] [address]   Glucose, capillary     Status: Abnormal   Collection Time: 11/20/17  8:06 AM  Result Value Ref Range   Glucose-Capillary 326 (H) 65 - 99 mg/dL  Magnesium     Status: None   Collection Time: 11/20/17 10:42 AM  Result Value Ref Range   Magnesium 2.0 1.7 - 2.4 mg/dL    Comment: Performed at [facility], [address], [place], [place] [address]  Phosphorus     Status: Abnormal   Collection Time: 11/20/17 10:42 AM  Result Value Ref Range   Phosphorus 2.4 (L) 2.5 - 4.6 mg/dL    Comment: Performed at [facility], [address] [place],  [address]  Glucose, capillary     Status: Abnormal   Collection Time: 11/20/17 12:24 PM  Result Value Ref Range   Glucose-Capillary 330 (H) 65 - 99 mg/dL  Blood gas, arterial  Status: Abnormal   Collection Time: 11/20/17  1:35 PM  Result Value Ref Range   FIO2 0.70    Delivery systems VENTILATOR    Mode PRESSURE REGULATED VOLUME CONTROL    VT 640 mL   LHR 18 resp/min   Peep/cpap 5.0 cm H20   pH, Arterial 7.450 7.350 - 7.450   pCO2 arterial 37.6 32.0 - 48.0 mmHg   pO2, Arterial 158 (H) 83.0 - 108.0 mmHg    Bicarbonate 26.4 20.0 - 28.0 mmol/L   Acid-Base Excess 2.1 (H) 0.0 - 2.0 mmol/L   O2 Saturation 98.8 %   Patient temperature 37.0    Collection site RIGHT RADIAL    Drawn by [phone]    Sample type ARTERIAL    Allens test (pass/fail) PASS PASS    Comment: Performed at [facility], [address], [place], [place] [address]  Troponin I (q 6hr x 3)     Status: Abnormal   Collection Time: 11/20/17  2:14 PM  Result Value Ref Range   Troponin I 0.63 (HH) <0.03 ng/mL    Comment: CRITICAL RESULT CALLED TO, READ BACK BY AND VERIFIED WITH: ROBERTS,T'@1527'  BY MATTHEWS, B 2.24.19 Performed at [facility], [address], [place], [place] [address]   Glucose, capillary     Status: Abnormal   Collection Time: 11/20/17  4:33 PM  Result Value Ref Range   Glucose-Capillary 254 (H) 65 - 99 mg/dL  Magnesium     Status: None   Collection Time: 11/20/17  5:01 PM  Result Value Ref Range   Magnesium 2.1 1.7 - 2.4 mg/dL    Comment: Performed at [facility], [address], [place], [place] [address]  Phosphorus     Status: Abnormal   Collection Time: 11/20/17  5:01 PM  Result Value Ref Range   Phosphorus 1.3 (L) 2.5 - 4.6 mg/dL    Comment: Performed at [facility], [address], [place], [place] [address]  Glucose, capillary     Status: Abnormal   Collection Time: 11/20/17  8:13 PM  Result Value Ref Range   Glucose-Capillary 244 (H) 65 - 99 mg/dL  Troponin I (q 6hr x 3)     Status: Abnormal   Collection Time: 11/20/17  9:28 PM  Result Value Ref Range   Troponin I 0.96 (HH) <0.03 ng/mL    Comment: CRITICAL RESULT CALLED TO, READ BACK BY AND VERIFIED WITH: GAMMONS,S @ 2330 ON 11/20/17 BY JUW Performed at [facility], [address] [place], [place] [address]   Glucose, capillary     Status: Abnormal   Collection Time: 11/21/17  1:08 AM  Result Value Ref Range   Glucose-Capillary 318 (H) 65 - 99 mg/dL  Troponin I (q 6hr x 3)     Status: Abnormal   Collection Time: 11/21/17  2:11 AM  Result Value  Ref Range   Troponin I 0.74 (HH) <0.03 ng/mL    Comment: CRITICAL VALUE NOTED.  VALUE IS CONSISTENT WITH PREVIOUSLY REPORTED AND CALLED VALUE. Performed at [facility], [address] [place],  [address]   Draw ABG 1 hour after initiation of ventilator     Status: Abnormal   Collection Time: 11/21/17  4:33 AM  Result Value Ref Range   FIO2 40.00    Delivery systems VENTILATOR    Mode PRESSURE REGULATED VOLUME CONTROL    VT 640 mL   LHR 18 resp/min   Peep/cpap 5.0 cm H20   pH, Arterial 7.511 (H) 7.350 - 7.450  pCO2 arterial 34.6 32.0 - 48.0 mmHg   pO2, Arterial 98.3 83.0 - 108.0 mmHg   Bicarbonate 28.7 (H) 20.0 - 28.0 mmol/L   Acid-Base Excess 4.4 (H) 0.0 - 2.0 mmol/L   O2 Saturation 97.6 %   Collection site ARTERIAL     Comment: RADIAL   Drawn by [id number]    Sample type ARTERIAL    Mechanical Rate 18     Comment: Performed at [facility], [address], [place], [place] [address]  Magnesium     Status: None   Collection Time: 11/21/17  4:40 AM  Result Value Ref Range   Magnesium 2.1 1.7 - 2.4 mg/dL    Comment: Performed at [facility], [address] [place], [place] [address]  Phosphorus     Status: Abnormal   Collection Time: 11/21/17  4:40 AM  Result Value Ref Range   Phosphorus 1.7 (L) 2.5 - 4.6 mg/dL    Comment: Performed at [facility], [address], [place], [place] [address]  Comprehensive metabolic panel     Status: Abnormal   Collection Time: 11/21/17  4:40 AM  Result Value Ref Range   Sodium 139 135 - 145 mmol/L   Potassium 4.0 3.5 - 5.1 mmol/L   Chloride 105 101 - 111 mmol/L   CO2 25 22 - 32 mmol/L   Glucose, Bld 334 (H) 65 - 99 mg/dL   BUN 61 (H) 6 - 20 mg/dL   Creatinine, Ser 1.09 0.61 - 1.24 mg/dL   Calcium 9.1 8.9 - 10.3 mg/dL   Total Protein 7.4 6.5 - 8.1 g/dL   Albumin 2.0 (L) 3.5 - 5.0 g/dL   AST 83 (H) 15 - 41 U/L   ALT 42 17 - 63 U/L   Alkaline Phosphatase 36 (L) 38 - 126 U/L   Total Bilirubin 0.4 0.3 - 1.2 mg/dL   GFR calc non Af  Amer >60 >60 mL/min   GFR calc Af Amer >60 >60 mL/min    Comment: (NOTE) The eGFR has been calculated using the CKD EPI equation. This calculation has not been validated in all clinical situations. eGFR's persistently <60 mL/min signify possible Chronic Kidney Disease.    Anion gap 9 5 - 15    Comment: Performed at [facility], [address], [place], [place] [address]  CBC     Status: Abnormal   Collection Time: 11/21/17  4:40 AM  Result Value Ref Range   WBC 5.8 4.0 - 10.5 K/uL   RBC 3.22 (L) 4.22 - 5.81 MIL/uL   Hemoglobin 9.2 (L) 13.0 - 17.0 g/dL    Comment: DELTA CHECK NOTED   HCT 29.1 (L) 39.0 - 52.0 %   MCV 90.4 78.0 - 100.0 fL   MCH 28.6 26.0 - 34.0 pg   MCHC 31.6 30.0 - 36.0 g/dL   RDW 14.3 11.5 - 15.5 %   Platelets 306 150 - 400 K/uL    Comment: Performed at [facility], [address] [place], [place] [address]  Glucose, capillary     Status: Abnormal   Collection Time: 11/21/17  5:27 AM  Result Value Ref Range   Glucose-Capillary 329 (H) 65 - 99 mg/dL    ABGS Recent Labs    11/21/17 0433  PHART 7.511*  PO2ART 98.3  HCO3 28.7*   CULTURES Recent Results (from the past 240 hour(s))  MRSA PCR Screening     Status: None   Collection Time: 11/18/17 11:40 PM  Result Value Ref Range Status  MRSA by PCR NEGATIVE NEGATIVE Final    Comment:        The GeneXpert MRSA Assay (FDA approved for NASAL specimens only), is one component of a comprehensive MRSA colonization surveillance program. It is not intended to diagnose MRSA infection nor to guide or monitor treatment for MRSA infections. Performed at [facility], [address], [place], [place] [address]   Culture, blood (x 2)     Status: None (Preliminary result)   Collection Time: 11/20/17  1:55 AM  Result Value Ref Range Status   Specimen Description BLOOD LEFT ARM  Final   Special Requests   Final    BOTTLES DRAWN AEROBIC AND ANAEROBIC Blood Culture adequate volume   Culture   Final    NO GROWTH <  12 HOURS Performed at [facility], [address], [place], [place] [address]    Report Status PENDING  Incomplete  Culture, blood (x 2)     Status: None (Preliminary result)   Collection Time: 11/20/17  2:10 AM  Result Value Ref Range Status   Specimen Description BLOOD RIGHT ARM  Final   Special Requests   Final    BOTTLES DRAWN AEROBIC AND ANAEROBIC Blood Culture adequate volume DRAWN BY RN   Culture   Final    NO GROWTH < 12 HOURS Performed at [facility], [address] [place], [place] [address]    Report Status PENDING  Incomplete   Studies/Results: Portable Chest Xray  Result Date: 11/21/2017 CLINICAL DATA:  Nasogastric tube placement. EXAM: PORTABLE CHEST 1 VIEW COMPARISON:  Abdominal radiograph November 20, 2016 FINDINGS: Nasogastric tube tip projects over the spine. Endotracheal tube tip projects 4.2 cm above the carina. Cardiomediastinal silhouette is normal. Calcified aortic arch. Patchy LEFT greater than RIGHT lower lobe airspace opacities without pleural effusion. No pneumothorax. Soft tissue planes and included osseous structures are unchanged. Old RIGHT clavicle fracture. IMPRESSION: Nasogastric tube tip projecting in distal stomach. Stable position of endotracheal tube. Patchy bibasilar airspace opacities concerning for pneumonia. Electronically Signed   By: Elon Alas M.D.   On: 11/21/2017 03:45   Portable Chest X-ray  Result Date: 11/20/2017 CLINICAL DATA:  Intubated EXAM: PORTABLE CHEST 1 VIEW COMPARISON:  Chest radiograph from earlier today. FINDINGS: Endotracheal tube tip is 4.5 cm above the carina. Enteric tube terminates in the lower thoracic esophagus. Stable cardiomediastinal silhouette with normal heart size. No pneumothorax. No pleural effusion. Patchy opacities at both lung bases, left greater than right, not appreciably changed. IMPRESSION: 1. Well-positioned endotracheal tube. 2. Enteric tube terminates in the lower thoracic esophagus and should be  advanced 10 cm. 3. Stable patchy bibasilar lung opacities, left greater than right, suspicious for pneumonia. These results were called by telephone at the time of interpretation on 11/20/2017 at 12:27 pm to [place], who verbally acknowledged these results. Electronically Signed   By: Ilona Sorrel M.D.   On: 11/20/2017 12:28   Dg Chest Port 1 View  Result Date: 11/20/2017 CLINICAL DATA:  62 year old male with altered mental status and sepsis. EXAM: PORTABLE CHEST 1 VIEW COMPARISON:  Chest radiograph dated 10/22/2015 FINDINGS: Left lung base atelectatic changes. Infiltrate is not excluded. Clinical correlation is recommended the right lung is clear. No pleural effusion or pneumothorax. The cardiac silhouette is within normal limits. No acute osseous pathology. IMPRESSION: Left lung base atelectasis. Infiltrate is not excluded. Clinical correlation is recommended. Electronically Signed   By: Anner Crete M.D.   On: 11/20/2017 02:04    Medications:  Prior to Admission:  Medications Prior to Admission  Medication Sig Dispense Refill Last Dose  . aspirin 325 MG tablet Take 325 mg by mouth at bedtime.    11/09/2017 at Unknown time  . celecoxib (CELEBREX) 200 MG capsule Take 200 mg by mouth daily as needed for mild pain.    Past Month at Unknown time  . Choline Fenofibrate (FENOFIBRIC ACID) 135 MG CPDR Take 135 mg by mouth at bedtime.    11/09/2017 at Unknown time  . CINNAMON PO Take 1,000 mg by mouth at bedtime.    Past Month at Unknown time  . clopidogrel (PLAVIX) 75 MG tablet TAKE 1 TABLET DAILY (Patient taking differently: TAKE 1 TABLET AT BEDTIME) 90 tablet 3 11/02/2017 at 0800  . Coenzyme Q10 (COQ10) 100 MG CAPS Take 100 mg by mouth at bedtime.   Past Month at Unknown time  . ezetimibe (ZETIA) 10 MG tablet Take 10 mg by mouth at bedtime.    11/09/2017 at Unknown time  . ibuprofen (ADVIL,MOTRIN) 200 MG tablet Take 400 mg every 8 (eight) hours as needed by mouth for mild pain.    Past Week  at Unknown time  . insulin lispro protamine-lispro (HUMALOG 75/25 MIX) (75-25) 100 UNIT/ML SUSP injection Inject 30-35 Units into the skin 2 (two) times daily with a meal. Patient takes 35 units in the morning and 30 units at night   11/09/2017 at 2000  . metFORMIN (GLUCOPHAGE) 500 MG tablet Take 1,000 mg by mouth 2 (two) times daily with a meal.   11/09/2017 at Unknown time  . metoprolol succinate (TOPROL-XL) 25 MG 24 hr tablet Take 25 mg by mouth at bedtime.   11/09/2017 at 2000  . Multiple Vitamin (MULTIVITAMIN) tablet Take 1 tablet by mouth daily.   Past Month at Unknown time  . naproxen sodium (ANAPROX) 220 MG tablet Take 440 mg by mouth daily as needed (pain).   Past Week at Unknown time  . nitroGLYCERIN (NITROSTAT) 0.4 MG SL tablet Place 0.4 mg every 5 (five) minutes as needed under the tongue for chest pain.   Taking  . Omega-3 Fatty Acids (FISH OIL) 1200 MG CAPS Take 1,200 mg by mouth at bedtime.    Past Month at Unknown time  . rosuvastatin (CRESTOR) 40 MG tablet Take 40 mg by mouth at bedtime.    11/09/2017 at Unknown time  . sulfamethoxazole-trimethoprim (BACTRIM DS,SEPTRA DS) 800-160 MG tablet Take 1 tablet by mouth 2 (two) times daily.    11/23/2017 at 0800  . tamsulosin (FLOMAX) 0.4 MG CAPS capsule Take 0.4 mg by mouth at bedtime.   11/14/2017 at 0800  . telmisartan (MICARDIS) 80 MG tablet Take 80 mg by mouth at bedtime.    11/09/2017 at 2000  . traMADol (ULTRAM) 50 MG tablet Take 50 mg by mouth every 8 (eight) hours as needed for moderate pain.    11/09/2017 at 2100  . traZODone (DESYREL) 100 MG tablet Take 50-100 mg by mouth at bedtime as needed.    11/09/2017 at Unknown time  . VICTOZA 18 MG/3ML SOPN Inject 1.8 mg into the skin daily.   11/09/2017 at 2000   Scheduled: . acetaminophen  650 mg Per Tube Q8H  . chlorhexidine gluconate (MEDLINE KIT)  15 mL Mouth Rinse BID  . enoxaparin (LOVENOX) injection  40 mg Subcutaneous Q24H  . feeding supplement (PRO-STAT SUGAR FREE 64)  30 mL Per Tube  BID  . feeding supplement (VITAL HIGH PROTEIN)  1,000 mL Per Tube Q24H  .  insulin aspart  0-20 Units Subcutaneous Q4H  . insulin detemir  18 Units Subcutaneous BID  . mouth rinse  15 mL Mouth Rinse QID  . methylPREDNISolone (SOLU-MEDROL) injection  40 mg Intravenous Q12H  . pantoprazole (PROTONIX) IV  40 mg Intravenous Q24H  . phosphorus  500 mg Oral QID   Continuous: . piperacillin-tazobactam (ZOSYN)  IV 3.375 g (11/21/17 0559)  . vancomycin 1,000 mg (11/21/17 0559)   PGF:[id number], albuterol, bisacodyl, fentaNYL (SUBLIMAZE) injection, fentaNYL (SUBLIMAZE) injection, hydrALAZINE, ketorolac, midazolam, midazolam, ondansetron **OR** ondansetron (ZOFRAN) IV, phenol  Assesment: He has respiratory failure.  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  He did not have any lung disease prior to his current illness.  He is on treatment for aspiration pneumonia which is appropriate.  He remains on the ventilator.  Ventilator settings have been adjusted based on his pH  He had cardiac arrhythmias last night and his electrolytes with the exception of phosphorus were okay.  Echocardiogram is pending.  He has elevated troponin  He came in with weakness and altered mental status thought to be related to hyponatremia and his sodium level is better.  He has continued very weak and with altered mental status  He has had multiple recent surgeries and has had significant deterioration in his overall status since then.  He has malnutrition and is on tube feeding Active Problems:   Hyponatremia   Protein-calorie malnutrition, severe   Pressure injury of skin    Plan: Continue current treatments.    LOS: 8 days   Brena Windsor L 11/21/2017, 7:00 AM

## 2017-11-21 NOTE — Progress Notes (Signed)
eLink Physician-Brief Progress Note Patient Name: Raymond [name] DOB: [birth year] MRN: [id number]   Date of Service  11/21/2017  HPI/Events of Note  62 yo male admitted to [facility] with hyponatremia , developed quadriparesis , aspiration - intubated -brief CPR Neurology treating for GBS, MRI neg for demyelination. Now transferred to [facility] for definitive Rx. VSS. PCCM admitting service.   eICU Interventions  No new orders.      Intervention Category Evaluation Type: New Patient Evaluation  Lysle Dingwall 11/21/2017, 11:02 PM

## 2017-11-21 NOTE — Progress Notes (Signed)
[place] A. Merlene Laughter, MD     [email or web address]          Raymond [name] is an 62 y.o. male.   ASSESSMENT/PLAN: 1. Guillain-Barr syndrome: Unfortunately, the patient has progressed after his last dose of immunoglobulins with dyspnea requiring intubation. His weakness is also got worse although this could be due to sedation. I will give the patient another round of IV immunoglobulins 438m/kg for 5 days. Patient has a NG tube to be fed. He will need a PEG. He may require tracheostomy depending on how he does.  2. Multifactorial encephalopathy including sedatives:    The patient required intubation overnight. He developed dyspnea was placed on BiPAP but unfortunately had to be placed on a breathing machine. It appears he had a run of ventricular tachycardia had to be shocked. It appears that his nephew was stable but the forced vital capacity dropped to 0.9. It was trending in the 2.6 range.    GENERAL: He is intubated and the lightly sedated.  HEENT:  Neck is supple and no evidence of trauma  EXTREMITIES: No edema; significant pain on palpation of the upper extremities.   BACK: Normal  SKIN: Normal by inspection.    MENTAL STATUS:   CRANIAL NERVES: Pupils are equal, round and reactive to light and accomodation; extra ocular movements are full, there is no significant nystagmus; visual fields are full; upper and lower facial muscles are normal in strength and symmetric, there is no flattening of the nasolabial folds; tongue is midline; uvula is midline; shoulder elevation is normal.  MOTOR: Briefly flexion withdrawal to pain involving the upper extremities 3/5.  COORDINATION: Brief twitching noted but no tremors. No parkinsonism or dysmetria appreciated.    REFLEXES: He is areflexic in the legs.  SENSATION: He responds to painful stimuli in the legs.       Blood pressure 104/65, pulse 100, temperature (!) 102 F (38.9 C), temperature source Axillary,  resp. rate 17, height _0  (1.854 m), weight 166 lb 7.2 oz (75.5 kg), SpO2 97 %.  Past Medical History:  Diagnosis Date  . Anemia   . Arthritis    Hands and left knee  . Coronary artery disease   . [place] mellitus without complication ([place]   . High cholesterol   . Hypertension   . MI (myocardial infarction) ([place] 2009    Past Surgical History:  Procedure Laterality Date  . APPENDECTOMY    . CARDIAC CATHETERIZATION    . COLONOSCOPY    . COLONOSCOPY N/A 04/01/2017   Procedure: COLONOSCOPY;  Surgeon: RDaneil Dolin MD;  Location: [facility] ENDO SUITE;  Service: Endoscopy;  Laterality: N/A;  9:45 AM  . CORONARY STENT PLACEMENT  2009   x3  . CYSTOSCOPY N/A 05/16/2017   Procedure: CYSTOSCOPY;  Surgeon: MCleon Gustin MD;  Location: [facility] ORS;  Service: Urology;  Laterality: N/A;  . CYSTOSCOPY W/ URETERAL STENT PLACEMENT Bilateral 04/11/2017   Procedure: CYSTOSCOPY WITH RETROGRADE PYELOGRAM;  Surgeon: MCleon Gustin MD;  Location: [facility] ORS;  Service: Urology;  Laterality: Bilateral;  . FRACTURE SURGERY Right    Steel Rod in femur  . INGUINAL HERNIA REPAIR Left 08/15/2017   Procedure: HERNIA REPAIR INGUINAL ADULT WITH MESH;  Surgeon: JAviva Signs MD;  Location: [facility] ORS;  Service: General;  Laterality: Left;  . KNEE ARTHROSCOPY Left   . TONSILLECTOMY    . TRANSURETHRAL RESECTION OF BLADDER TUMOR N/A 04/11/2017   Procedure: TRANSURETHRAL RESECTION OF BLADDER TUMOR (TURBT);  Surgeon:  McKenzie, Candee Furbish, MD;  Location: [facility] ORS;  Service: Urology;  Laterality: N/A;  1 [id number]  . TRANSURETHRAL RESECTION OF BLADDER TUMOR N/A 05/16/2017   Procedure: TRANSURETHRAL RESECTION OF BLADDER TUMOR (TURBT);  Surgeon: Cleon Gustin, MD;  Location: [facility] ORS;  Service: Urology;  Laterality: N/A;    Family History  Problem Relation Age of Onset  . Diabetes Mother   . Heart attack Mother   . Cancer Father        lung  . Stroke Paternal Grandfather   . Colon  cancer Paternal Uncle     Social History:  reports that  has never smoked. he has never used smokeless tobacco. He reports that he does not drink alcohol or use drugs.  Allergies: No Known Allergies  Medications: Prior to Admission medications   Medication Sig Start Date End Date Taking? Authorizing Provider  aspirin 325 MG tablet Take 325 mg by mouth at bedtime.    Yes [provider]  celecoxib (CELEBREX) 200 MG capsule Take 200 mg by mouth daily as needed for mild pain.    Yes [provider]  Choline Fenofibrate (FENOFIBRIC ACID) 135 MG CPDR Take 135 mg by mouth at bedtime.    Yes [provider]  CINNAMON PO Take 1,000 mg by mouth at bedtime.    Yes [provider]  clopidogrel (PLAVIX) 75 MG tablet TAKE 1 TABLET DAILY Patient taking differently: TAKE 1 TABLET AT BEDTIME 02/23/13  Yes Lorretta Harp, MD  Coenzyme Q10 (COQ10) 100 MG CAPS Take 100 mg by mouth at bedtime.   Yes [provider]  ezetimibe (ZETIA) 10 MG tablet Take 10 mg by mouth at bedtime.    Yes [provider]  ibuprofen (ADVIL,MOTRIN) 200 MG tablet Take 400 mg every 8 (eight) hours as needed by mouth for mild pain.    Yes [provider]  insulin lispro protamine-lispro (HUMALOG 75/25 MIX) (75-25) 100 UNIT/ML SUSP injection Inject 30-35 Units into the skin 2 (two) times daily with a meal. Patient takes 35 units in the morning and 30 units at night   Yes [provider]  metFORMIN (GLUCOPHAGE) 500 MG tablet Take 1,000 mg by mouth 2 (two) times daily with a meal. 03/09/17  Yes [provider]  metoprolol succinate (TOPROL-XL) 25 MG 24 hr tablet Take 25 mg by mouth at bedtime. 02/08/17  Yes [provider]  Multiple Vitamin (MULTIVITAMIN) tablet Take 1 tablet by mouth daily.   Yes [provider]  naproxen sodium (ANAPROX) 220 MG tablet Take 440 mg by mouth daily as needed (pain).   Yes [provider]  nitroGLYCERIN  (NITROSTAT) 0.4 MG SL tablet Place 0.4 mg every 5 (five) minutes as needed under the tongue for chest pain.   Yes [provider]  Omega-3 Fatty Acids (FISH OIL) 1200 MG CAPS Take 1,200 mg by mouth at bedtime.    Yes [provider]  rosuvastatin (CRESTOR) 40 MG tablet Take 40 mg by mouth at bedtime.    Yes [provider]  sulfamethoxazole-trimethoprim (BACTRIM DS,SEPTRA DS) 800-160 MG tablet Take 1 tablet by mouth 2 (two) times daily.  11/02/17  Yes [provider]  tamsulosin (FLOMAX) 0.4 MG CAPS capsule Take 0.4 mg by mouth at bedtime.   Yes [provider]  telmisartan (MICARDIS) 80 MG tablet Take 80 mg by mouth at bedtime.    Yes [provider]  traMADol (ULTRAM) 50 MG tablet Take 50 mg by mouth  every 8 (eight) hours as needed for moderate pain.  11/07/17  Yes [provider]  traZODone (DESYREL) 100 MG tablet Take 50-100 mg by mouth at bedtime as needed.  11/07/17  Yes [provider]  VICTOZA 18 MG/3ML SOPN Inject 1.8 mg into the skin daily. 03/03/17  Yes [provider]    Scheduled Meds: . acetaminophen  650 mg Per Tube Q8H  . chlorhexidine gluconate (MEDLINE KIT)  15 mL Mouth Rinse BID  . enoxaparin (LOVENOX) injection  40 mg Subcutaneous Q24H  . feeding supplement (PRO-STAT SUGAR FREE 64)  30 mL Per Tube BID  . feeding supplement (VITAL HIGH PROTEIN)  1,000 mL Per Tube Q24H  . insulin aspart  0-20 Units Subcutaneous Q4H  . insulin detemir  18 Units Subcutaneous BID  . mouth rinse  15 mL Mouth Rinse QID  . methylPREDNISolone (SOLU-MEDROL) injection  40 mg Intravenous Q12H  . pantoprazole (PROTONIX) IV  40 mg Intravenous Q24H  . phosphorus  500 mg Oral QID   Continuous Infusions: . piperacillin-tazobactam (ZOSYN)  IV 3.375 g (11/21/17 0559)  . vancomycin 1,000 mg (11/21/17 0559)   PRN Meds:.acetaminophen, albuterol, bisacodyl, fentaNYL (SUBLIMAZE) injection, fentaNYL (SUBLIMAZE) injection, hydrALAZINE,  ketorolac, midazolam, midazolam, ondansetron **OR** ondansetron (ZOFRAN) IV, phenol     Results for orders placed or performed during the hospital encounter of 11/24/2017 (from the past 48 hour(s))  Glucose, capillary     Status: Abnormal   Collection Time: 11/19/17 11:56 AM  Result Value Ref Range   Glucose-Capillary 222 (H) 65 - 99 mg/dL  Blood gas, arterial     Status: Abnormal   Collection Time: 11/19/17  1:12 PM  Result Value Ref Range   O2 Content 2.0 L/min   pH, Arterial 7.458 (H) 7.350 - 7.450   pCO2 arterial 43.4 32.0 - 48.0 mmHg   pO2, Arterial 71.2 (L) 83.0 - 108.0 mmHg   Bicarbonate 29.9 (H) 20.0 - 28.0 mmol/L   Acid-Base Excess 6.3 (H) 0.0 - 2.0 mmol/L   O2 Saturation 94.2 %   Patient temperature 37.0    Collection site RIGHT RADIAL    Drawn by [phone]    Sample type ARTERIAL    Allens test (pass/fail) PASS PASS    Comment: Performed at [facility], [address], [place], [place] [address]  Glucose, capillary     Status: Abnormal   Collection Time: 11/19/17  6:03 PM  Result Value Ref Range   Glucose-Capillary 305 (H) 65 - 99 mg/dL  Basic metabolic panel     Status: Abnormal   Collection Time: 11/20/17  1:55 AM  Result Value Ref Range   Sodium 139 135 - 145 mmol/L    Comment: DELTA CHECK NOTED   Potassium 4.7 3.5 - 5.1 mmol/L   Chloride 98 (L) 101 - 111 mmol/L   CO2 29 22 - 32 mmol/L   Glucose, Bld 240 (H) 65 - 99 mg/dL   BUN 54 (H) 6 - 20 mg/dL   Creatinine, Ser 0.89 0.61 - 1.24 mg/dL   Calcium 10.4 (H) 8.9 - 10.3 mg/dL   GFR calc non Af Amer >60 >60 mL/min   GFR calc Af Amer >60 >60 mL/min    Comment: (NOTE) The eGFR has been calculated using the CKD EPI equation. This calculation has not been validated in all clinical situations. eGFR's persistently <60 mL/min signify possible Chronic Kidney Disease.    Anion gap 12 5 - 15    Comment: Performed at [facility], [address], [place], [place] [address]  CBC     Status: Abnormal   Collection Time:  11/20/17  1:55 AM  Result Value Ref Range   WBC 4.3 4.0 - 10.5 K/uL   RBC 4.00 (L) 4.22 - 5.81 MIL/uL   Hemoglobin 11.6 (L) 13.0 - 17.0 g/dL   HCT 34.8 (L) 39.0 - 52.0 %   MCV 87.0 78.0 - 100.0 fL   MCH 29.0 26.0 - 34.0 pg   MCHC 33.3 30.0 - 36.0 g/dL   RDW 13.9 11.5 - 15.5 %   Platelets 349 150 - 400 K/uL    Comment: Performed at [facility], [address], [place], [place] [address]  Culture, blood (x 2)     Status: None (Preliminary result)   Collection Time: 11/20/17  1:55 AM  Result Value Ref Range   Specimen Description BLOOD LEFT ARM    Special Requests      BOTTLES DRAWN AEROBIC AND ANAEROBIC Blood Culture adequate volume   Culture      NO GROWTH 1 DAY Performed at [facility], [address], [place],  [address]    Report Status PENDING   Procalcitonin     Status: None   Collection Time: 11/20/17  1:55 AM  Result Value Ref Range   Procalcitonin 1.67 ng/mL    Comment:        Interpretation: PCT > 0.5 ng/mL and <= 2 ng/mL: Systemic infection (sepsis) is possible, but other conditions are known to elevate PCT as well. (NOTE)       Sepsis PCT Algorithm           Lower Respiratory Tract                                      Infection PCT Algorithm    ----------------------------     ----------------------------         PCT < 0.25 ng/mL                PCT < 0.10 ng/mL         Strongly encourage             Strongly discourage   discontinuation of antibiotics    initiation of antibiotics    ----------------------------     -----------------------------       PCT 0.25 - 0.50 ng/mL            PCT 0.10 - 0.25 ng/mL               OR       >80% decrease in PCT            Discourage initiation of                                            antibiotics      Encourage discontinuation           of antibiotics    ----------------------------     -----------------------------         PCT >= 0.50 ng/mL              PCT 0.26 - 0.50 ng/mL                AND       <80%  decrease  in PCT             Encourage initiation of                                             antibiotics       Encourage continuation           of antibiotics    ----------------------------     -----------------------------        PCT >= 0.50 ng/mL                  PCT > 0.50 ng/mL               AND         increase in PCT                  Strongly encourage                                      initiation of antibiotics    Strongly encourage escalation           of antibiotics                                     -----------------------------                                           PCT <= 0.25 ng/mL                                                 OR                                        > 80% decrease in PCT                                     Discontinue / Do not initiate                                             antibiotics Performed at [facility], [address] [place], [place] [address]   Culture, blood (x 2)     Status: None (Preliminary result)   Collection Time: 11/20/17  2:10 AM  Result Value Ref Range   Specimen Description BLOOD RIGHT ARM    Special Requests      BOTTLES DRAWN AEROBIC AND ANAEROBIC Blood Culture adequate volume DRAWN BY RN   Culture      NO GROWTH 1 DAY Performed at [facility], [address], [place], [place] [address]    Report Status PENDING   Blood gas, arterial     Status: Abnormal   Collection Time: 11/20/17  4:08 AM  Result Value Ref Range   FIO2 60.00    Delivery systems BILEVEL POSITIVE AIRWAY PRESSURE    LHR 8 resp/min   Inspiratory PAP 14    Expiratory PAP 6    pH, Arterial 7.499 (H) 7.350 - 7.450   pCO2 arterial 39.8 32.0 - 48.0 mmHg   pO2, Arterial 89.3 83.0 - 108.0 mmHg   Bicarbonate 30.9 (H) 20.0 - 28.0 mmol/L   Acid-Base Excess 7.1 (H) 0.0 - 2.0 mmol/L   O2 Saturation 96.8 %   Collection site RADIAL    Drawn by [phone]    Sample type ARTERIAL    Allens test (pass/fail) PASS PASS   Mechanical Rate 8     Comment:  Performed at [facility], [address], [place], [place] [address]  Urinalysis, Routine w reflex microscopic     Status: Abnormal   Collection Time: 11/20/17  4:39 AM  Result Value Ref Range   Color, Urine YELLOW YELLOW   APPearance CLEAR CLEAR   Specific Gravity, Urine 1.024 1.005 - 1.030   pH 5.0 5.0 - 8.0   Glucose, UA 150 (A) NEGATIVE mg/dL   Hgb urine dipstick SMALL (A) NEGATIVE   Bilirubin Urine NEGATIVE NEGATIVE   Ketones, ur NEGATIVE NEGATIVE mg/dL   Protein, ur 30 (A) NEGATIVE mg/dL   Nitrite POSITIVE (A) NEGATIVE   Leukocytes, UA TRACE (A) NEGATIVE   RBC / HPF 0-5 0 - 5 RBC/hpf   WBC, UA 6-30 0 - 5 WBC/hpf   Bacteria, UA NONE SEEN NONE SEEN   Squamous Epithelial / LPF NONE SEEN NONE SEEN   Mucus PRESENT    Hyaline Casts, UA PRESENT     Comment: Performed at [facility], [address] [place], [place] [address]  Influenza panel by PCR (type A & B)     Status: None   Collection Time: 11/20/17  4:39 AM  Result Value Ref Range   Influenza A By PCR NEGATIVE NEGATIVE   Influenza B By PCR NEGATIVE NEGATIVE    Comment: (NOTE) The Xpert Xpress Flu assay is intended as an aid in the diagnosis of  influenza and should not be used as a sole basis for treatment.  This  assay is FDA approved for nasopharyngeal swab specimens only. Nasal  washings and aspirates are unacceptable for Xpert Xpress Flu testing. Performed at [facility], [address], [place], [place] [address]   Glucose, capillary     Status: Abnormal   Collection Time: 11/20/17  8:06 AM  Result Value Ref Range   Glucose-Capillary 326 (H) 65 - 99 mg/dL  Magnesium     Status: None   Collection Time: 11/20/17 10:42 AM  Result Value Ref Range   Magnesium 2.0 1.7 - 2.4 mg/dL    Comment: Performed at [facility], [address], [place], [place] [address]  Phosphorus     Status: Abnormal   Collection Time: 11/20/17 10:42 AM  Result Value Ref Range   Phosphorus 2.4 (L) 2.5 - 4.6 mg/dL    Comment: Performed at  [facility], [address] [place], [place] [address]  TSH     Status: None   Collection Time: 11/20/17 10:43 AM  Result Value Ref Range   TSH 0.865 0.350 - 4.500 uIU/mL    Comment: Performed by a 3rd Generation assay with a functional sensitivity of <=0.01 uIU/mL. Performed at [facility], [address], [place], [place] [address]   Glucose, capillary     Status: Abnormal  Collection Time: 11/20/17 12:24 PM  Result Value Ref Range   Glucose-Capillary 330 (H) 65 - 99 mg/dL  Blood gas, arterial     Status: Abnormal   Collection Time: 11/20/17  1:35 PM  Result Value Ref Range   FIO2 0.70    Delivery systems VENTILATOR    Mode PRESSURE REGULATED VOLUME CONTROL    VT 640 mL   LHR 18 resp/min   Peep/cpap 5.0 cm H20   pH, Arterial 7.450 7.350 - 7.450   pCO2 arterial 37.6 32.0 - 48.0 mmHg   pO2, Arterial 158 (H) 83.0 - 108.0 mmHg   Bicarbonate 26.4 20.0 - 28.0 mmol/L   Acid-Base Excess 2.1 (H) 0.0 - 2.0 mmol/L   O2 Saturation 98.8 %   Patient temperature 37.0    Collection site RIGHT RADIAL    Drawn by [phone]    Sample type ARTERIAL    Allens test (pass/fail) PASS PASS    Comment: Performed at [facility], [address], [place], [place] [address]  Troponin I (q 6hr x 3)     Status: Abnormal   Collection Time: 11/20/17  2:14 PM  Result Value Ref Range   Troponin I 0.63 (HH) <0.03 ng/mL    Comment: CRITICAL RESULT CALLED TO, READ BACK BY AND VERIFIED WITH: ROBERTS,T_0  BY MATTHEWS, B 2.24.19 Performed at [facility], [address], [place], [place] [address]   Glucose, capillary     Status: Abnormal   Collection Time: 11/20/17  4:33 PM  Result Value Ref Range   Glucose-Capillary 254 (H) 65 - 99 mg/dL  Magnesium     Status: None   Collection Time: 11/20/17  5:01 PM  Result Value Ref Range   Magnesium 2.1 1.7 - 2.4 mg/dL    Comment: Performed at [facility], [address], [place], [place] [address]  Phosphorus     Status: Abnormal   Collection Time: 11/20/17  5:01 PM   Result Value Ref Range   Phosphorus 1.3 (L) 2.5 - 4.6 mg/dL    Comment: Performed at [facility], [address], [place], [place] [address]  Glucose, capillary     Status: Abnormal   Collection Time: 11/20/17  8:13 PM  Result Value Ref Range   Glucose-Capillary 244 (H) 65 - 99 mg/dL  Troponin I (q 6hr x 3)     Status: Abnormal   Collection Time: 11/20/17  9:28 PM  Result Value Ref Range   Troponin I 0.96 (HH) <0.03 ng/mL    Comment: CRITICAL RESULT CALLED TO, READ BACK BY AND VERIFIED WITH: GAMMONS,S @ 2330 ON 11/20/17 BY JUW Performed at [facility], [address] [place], [place] [address]   Glucose, capillary     Status: Abnormal   Collection Time: 11/21/17  1:08 AM  Result Value Ref Range   Glucose-Capillary 318 (H) 65 - 99 mg/dL  Troponin I (q 6hr x 3)     Status: Abnormal   Collection Time: 11/21/17  2:11 AM  Result Value Ref Range   Troponin I 0.74 (HH) <0.03 ng/mL    Comment: CRITICAL VALUE NOTED.  VALUE IS CONSISTENT WITH PREVIOUSLY REPORTED AND CALLED VALUE. Performed at [facility], [address] [place],  [address]   Draw ABG 1 hour after initiation of ventilator     Status: Abnormal   Collection Time: 11/21/17  4:33 AM  Result Value Ref Range   FIO2 40.00    Delivery systems VENTILATOR    Mode PRESSURE REGULATED VOLUME CONTROL  VT 640 mL   LHR 18 resp/min   Peep/cpap 5.0 cm H20   pH, Arterial 7.511 (H) 7.350 - 7.450   pCO2 arterial 34.6 32.0 - 48.0 mmHg   pO2, Arterial 98.3 83.0 - 108.0 mmHg   Bicarbonate 28.7 (H) 20.0 - 28.0 mmol/L   Acid-Base Excess 4.4 (H) 0.0 - 2.0 mmol/L   O2 Saturation 97.6 %   Collection site ARTERIAL     Comment: RADIAL   Drawn by [phone]    Sample type ARTERIAL    Mechanical Rate 18     Comment: Performed at [facility], [address] [place], [place] [address]  Magnesium     Status: None   Collection Time: 11/21/17  4:40 AM  Result Value Ref Range   Magnesium 2.1 1.7 - 2.4 mg/dL    Comment: Performed at [facility], [address], [place], [place] [address]  Phosphorus     Status: Abnormal   Collection Time: 11/21/17  4:40 AM  Result Value Ref Range   Phosphorus 1.7 (L) 2.5 - 4.6 mg/dL    Comment: Performed at [facility], [address] [place], [place] [address]  Comprehensive metabolic panel     Status: Abnormal   Collection Time: 11/21/17  4:40 AM  Result Value Ref Range   Sodium 139 135 - 145 mmol/L   Potassium 4.0 3.5 - 5.1 mmol/L   Chloride 105 101 - 111 mmol/L   CO2 25 22 - 32 mmol/L   Glucose, Bld 334 (H) 65 - 99 mg/dL   BUN 61 (H) 6 - 20 mg/dL   Creatinine, Ser 1.09 0.61 - 1.24 mg/dL   Calcium 9.1 8.9 - 10.3 mg/dL   Total Protein 7.4 6.5 - 8.1 g/dL   Albumin 2.0 (L) 3.5 - 5.0 g/dL   AST 83 (H) 15 - 41 U/L   ALT 42 17 - 63 U/L   Alkaline Phosphatase 36 (L) 38 - 126 U/L   Total Bilirubin 0.4 0.3 - 1.2 mg/dL   GFR calc non Af Amer >60 >60 mL/min   GFR calc Af Amer >60 >60 mL/min    Comment: (NOTE) The eGFR has been calculated using the CKD EPI equation. This calculation has not been validated in all clinical situations. eGFR's persistently <60 mL/min signify possible Chronic Kidney Disease.    Anion gap 9 5 - 15    Comment: Performed at [facility], [address] [place], [place] [address]  CBC     Status: Abnormal   Collection Time: 11/21/17  4:40 AM  Result Value Ref Range   WBC 5.8 4.0 - 10.5 K/uL   RBC 3.22 (L) 4.22 - 5.81 MIL/uL   Hemoglobin 9.2 (L) 13.0 - 17.0 g/dL    Comment: DELTA CHECK NOTED   HCT 29.1 (L) 39.0 - 52.0 %   MCV 90.4 78.0 - 100.0 fL   MCH 28.6 26.0 - 34.0 pg   MCHC 31.6 30.0 - 36.0 g/dL   RDW 14.3 11.5 - 15.5 %   Platelets 306 150 - 400 K/uL    Comment: Performed at [facility], [address], [place], [place] [address]  Glucose, capillary     Status: Abnormal   Collection Time: 11/21/17  5:27 AM  Result Value Ref Range   Glucose-Capillary 329 (H) 65 - 99 mg/dL  Glucose, capillary     Status: Abnormal   Collection Time: 11/21/17  7:51  AM  Result Value Ref Range   Glucose-Capillary 352 (H) 65 -  99 mg/dL    Studies/Results:  L SPINE MRI FINDINGS: Segmentation: 5 non rib-bearing lumbar type vertebral bodies are present.  Alignment:  AP alignment is anatomic.  Vertebrae:  Marrow signal and vertebral body heights are normal.  Conus medullaris and cauda equina: Conus extends to the L1-2 level. Conus and cauda equina appear normal.  Paraspinal and other soft tissues: Limited imaging of the abdomen is unremarkable. There is no significant adenopathy.  Disc levels:  L1-2: Negative.  L2-3: Negative.  L3-4: Mild facet hypertrophy is present bilaterally. No significant disc protrusion or stenosis is present.  L4-5: A right paramedian disc protrusion and annular tear is present. There is potential impact on the traversing right L5 nerve roots. The foramina are patent.  L5-S1: A shallow central disc protrusion and annular tear is present without focal stenosis. The foramina are patent.  IMPRESSION: 1. Right paramedian disc protrusion and annular tear at L4-5 with potential impact on the traversing right L5 nerve roots. 2. Shallow central disc protrusion and annular tear at L5-S1 without focal stenosis.   Lumbar spine MRI is reviewed in person.  There is small disc protrusion at L4-L5 and L5-S1.  The protrusion at each level does not appear to cause significant compromise.     ADB PELVIC CT FINDINGS: Lower chest: Lung bases clear  Hepatobiliary: Gallbladder and liver normal appearance  Pancreas: Normal appearance  Spleen: Normal appearance  Adrenals/Urinary Tract: Minimal chronic thickening of LEFT adrenal gland without discrete mass. Kidneys and ureters normal appearance. Foley catheter decompresses urinary bladder. Bladder mass identified on the previous exam not visualized.  Stomach/Bowel: Appendix surgically absent by history. Stomach and bowel loops normal  appearance  Vascular/Lymphatic: Atherosclerotic calcifications aorta and coronary arteries without aortic aneurysm. Few normal sized to mildly enlarged lymph nodes at the LEFT inguinal region.  Reproductive: Mild prostatic enlargement gland measuring 5.9 x 4.1 x 3.2 cm. Seminal vesicles unremarkable.  Other: Infiltrative changes at LEFT inguinal region, by history inguinal herniorrhaphy 08/15/2017. Small focal fluid collection at LEFT inguinal hernia repair 21 x 9 mm image 83 question seroma or lymphocele, infection considered less likely this far removed from surgery though not completely excluded. No free intraperitoneal air or fluid.  Musculoskeletal: Unremarkable  IMPRESSION: Prostatic enlargement.  No acute intra-abdominal or intrapelvic abnormalities.  Postoperative changes of LEFT inguinal herniorrhaphy with reactive lymph nodes and a 21 x 9 mm fluid collection at the surgical bed favor seroma versus lymphocele as discussed above.     BRAIN MRI MRA  FINDINGS: MRI HEAD FINDINGS  Brain: Diffusion imaging does not show any acute or subacute infarction. The brain shows generalized atrophy, progressive since 2010. The study does not show a pattern of advanced small vessel ischemic change, with only a few small foci within the hemispheric white matter. There is some layering material dependent within the ventricles, probably some old blood products related to the falls. The differential diagnosis would be layering debris secondary to meningitis. No evidence of mass lesion. No extra-axial collection.  Vascular: Major vessels at the base of the brain show flow.  Skull and upper cervical spine: Negative  Sinuses/Orbits: Clear/normal  Other: None  MRA HEAD FINDINGS  Both internal carotid arteries are patent through the skull base and siphon regions. On the right, this vessel supplies only the middle cerebral artery. No MCA stenosis or occlusion. On  the left, this vessel supplies the left middle cerebral artery and both anterior cerebral arteries. No stenosis, occlusion, aneurysm or high flow vascular malformation.  Both vertebral arteries are widely patent to the basilar. No basilar stenosis. Posterior circulation branch vessels appear normal.  IMPRESSION: Generalized brain atrophy, progressive since the CT scan of 2010. There are only minimal small vessel changes of the hemispheric white matter.  Some layering material in the occipital horns of lateral ventricles, probably blood products related to recent falls described clinically. Differential diagnosis does include debris secondary to meningitis, but that seems less likely.  Negative intracranial MR angiography of the large and medium size vessels. 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      MRI  C AND T SPINE  FINDINGS: MRI CERVICAL SPINE FINDINGS  Alignment: Normal  Vertebrae: No fracture or primary bone lesion.  Cord: To cord compression or primary cord lesion.  Posterior Fossa, vertebral arteries, paraspinal tissues: Negative  Disc levels:  No abnormality at the foramen magnum, C1-2 or C2-3.  C3-4: Right-sided predominant spondylosis. Mild to moderate right foraminal narrowing. No compressive central canal stenosis.  C4-5 through C7-T1: Normal.  MRI thoracic SPINE FINDINGS  Alignment:  Normal  Vertebrae: No fracture or primary bone lesion. Ordinary small endplate Schmorl's nodes in the midthoracic region.  Cord: No evidence of primary cord lesion. No evidence of abnormal cord T2 signal. Ample subarachnoid space present dorsal to the cord. See below.  Paraspinal and other soft tissues: Negative  Disc levels:  No abnormality at T6-7 or above.  T7-8: Central disc herniation effaces the ventral subarachnoid space and contacts the ventral cord. No  foraminal extension. No cord compression.  T8-9: Small central disc herniation indents the ventral subarachnoid space and contacts the ventral cord. No foraminal extension. No cord compression.  T9-10: Right paracentral disc herniation effaces the ventral subarachnoid space on the right and contacts the right ventral cord. No foraminal extension. No cord compression.  T10-11: Central disc herniation with upward migration of disc material behind the T10 vertebral body. Effacement of the ventral subarachnoid space with indentation of the cord more on the left. Ample subarachnoid space present dorsal to the cord. No foraminal extension.  T11-12: Normal.  T12-L1: Normal.  IMPRESSION: Cervical spine: No acute or traumatic finding. Right-sided spondylosis at C3-4 with mild to moderate right foraminal narrowing.  Thoracic spine: No acute or traumatic finding. Central disc herniations at T7-8 and T8-9 without likely neural compression. Central to right-sided disc herniation at T9-10 without likely neural compression. These could certainly be associated with back pain. T10-11 central disc herniation with upward migration behind T10 with some indentation of the left side of the cord. Ample subarachnoid space present dorsal to the cord, therefore cord compression is unlikely. This could also be associated with back pain however.      THE IMAGING ARE REVIEWED IN PERSON.  A brain MRI shows a tiny increased signal involving the right posterior aspect of the temporal horn on DWI.  This is not seen on the ADC scan.  Significance is unclear.  No other evidence of acute changes are noted.  There is moderate global atrophy more prominent than expected for age.  There is minimal white matter changes seen on FLAIR imaging.  No hemorrhages appreciated.  The cervical and the thoracic spine MRI is also reviewed.  The thoracic MRI is surprisingly under well min.  The thoracic MRI shows a large  disc herniation posterior  At T10-T11 which encroach is the spinal cord but there is adequate room posteriorly.  There is a smaller disc at T7  T8.        Raymond [name] A. Merlene Laughter, M.D.  Diplomate, Tax adviser of Psychiatry and Neurology ( Neurology). 11/21/2017, 8:50 AM

## 2017-11-22 ENCOUNTER — Inpatient Hospital Stay (HOSPITAL_COMMUNITY): Payer: Federal, State, Local not specified - PPO

## 2017-11-22 DIAGNOSIS — J9602 Acute respiratory failure with hypercapnia: Secondary | ICD-10-CM

## 2017-11-22 DIAGNOSIS — D649 Anemia, unspecified: Secondary | ICD-10-CM | POA: Diagnosis present

## 2017-11-22 DIAGNOSIS — E871 Hypo-osmolality and hyponatremia: Secondary | ICD-10-CM

## 2017-11-22 DIAGNOSIS — Z978 Presence of other specified devices: Secondary | ICD-10-CM | POA: Diagnosis present

## 2017-11-22 DIAGNOSIS — R569 Unspecified convulsions: Secondary | ICD-10-CM

## 2017-11-22 DIAGNOSIS — E43 Unspecified severe protein-calorie malnutrition: Secondary | ICD-10-CM

## 2017-11-22 DIAGNOSIS — R1030 Lower abdominal pain, unspecified: Secondary | ICD-10-CM

## 2017-11-22 DIAGNOSIS — J9601 Acute respiratory failure with hypoxia: Secondary | ICD-10-CM

## 2017-11-22 LAB — URINE CULTURE: Culture: >=100000 — AB

## 2017-11-22 LAB — CSF CELL COUNT WITH DIFFERENTIAL
RBC Count, CSF: 11 /mm3 — ABNORMAL HIGH
RBC Count, CSF: 7 /mm3 — ABNORMAL HIGH
TUBE #: 4
WBC, CSF: 6 /mm3 — ABNORMAL HIGH (ref 0–5)
WBC, CSF: 6 /mm3 — ABNORMAL HIGH (ref 0–5)

## 2017-11-22 LAB — GLUCOSE, CAPILLARY
GLUCOSE-CAPILLARY: 111 mg/dL — AB (ref 65–99)
GLUCOSE-CAPILLARY: 151 mg/dL — AB (ref 65–99)
GLUCOSE-CAPILLARY: 187 mg/dL — AB (ref 65–99)
GLUCOSE-CAPILLARY: 239 mg/dL — AB (ref 65–99)
Glucose-Capillary: 252 mg/dL — ABNORMAL HIGH (ref 65–99)
Glucose-Capillary: 180 mg/dL — ABNORMAL HIGH (ref 65–99)

## 2017-11-22 LAB — BLOOD GAS, ARTERIAL
Acid-Base Excess: 5.3 mmol/L — ABNORMAL HIGH (ref 0.0–2.0)
BICARBONATE: 28.8 mmol/L — AB (ref 20.0–28.0)
Drawn by: 249101
FIO2: 40
LHR: 14 {breaths}/min
O2 SAT: 98.2 %
PCO2 ART: 38.7 mmHg (ref 32.0–48.0)
PEEP/CPAP: 5 cmH2O
PH ART: 7.485 — AB (ref 7.350–7.450)
PO2 ART: 114 mmHg — AB (ref 83.0–108.0)
Patient temperature: 98.6
VT: 550 mL

## 2017-11-22 LAB — COMPREHENSIVE METABOLIC PANEL
ALK PHOS: 39 U/L (ref 38–126)
ALT: 60 U/L (ref 17–63)
AST: 92 U/L — ABNORMAL HIGH (ref 15–41)
Albumin: 1.8 g/dL — ABNORMAL LOW (ref 3.5–5.0)
Anion gap: 8 (ref 5–15)
BILIRUBIN TOTAL: 0.2 mg/dL — AB (ref 0.3–1.2)
BUN: 55 mg/dL — ABNORMAL HIGH (ref 6–20)
CALCIUM: 9.3 mg/dL (ref 8.9–10.3)
CO2: 27 mmol/L (ref 22–32)
CREATININE: 0.9 mg/dL (ref 0.61–1.24)
Chloride: 110 mmol/L (ref 101–111)
Glucose, Bld: 131 mg/dL — ABNORMAL HIGH (ref 65–99)
Potassium: 4.2 mmol/L (ref 3.5–5.1)
Sodium: 145 mmol/L (ref 135–145)
TOTAL PROTEIN: 8 g/dL (ref 6.5–8.1)

## 2017-11-22 LAB — PROTEIN AND GLUCOSE, CSF: Total  Protein, CSF: 370 mg/dL — ABNORMAL HIGH (ref 15–45)

## 2017-11-22 LAB — CBC
HCT: 30.4 % — ABNORMAL LOW (ref 39.0–52.0)
HEMATOCRIT: 31.5 % — AB (ref 39.0–52.0)
HEMOGLOBIN: 10 g/dL — AB (ref 13.0–17.0)
Hemoglobin: 9.6 g/dL — ABNORMAL LOW (ref 13.0–17.0)
MCH: 28.5 pg (ref 26.0–34.0)
MCH: 28.9 pg (ref 26.0–34.0)
MCHC: 31.6 g/dL (ref 30.0–36.0)
MCHC: 31.7 g/dL (ref 30.0–36.0)
MCV: 90.2 fL (ref 78.0–100.0)
MCV: 91 fL (ref 78.0–100.0)
PLATELETS: 325 10*3/uL (ref 150–400)
Platelets: 314 10*3/uL (ref 150–400)
RBC: 3.37 MIL/uL — ABNORMAL LOW (ref 4.22–5.81)
RBC: 3.46 MIL/uL — AB (ref 4.22–5.81)
RDW: 14.5 % (ref 11.5–15.5)
RDW: 14.7 % (ref 11.5–15.5)
WBC: 12.6 10*3/uL — ABNORMAL HIGH (ref 4.0–10.5)
WBC: 11.8 10*3/uL — ABNORMAL HIGH (ref 4.0–10.5)

## 2017-11-22 LAB — VANCOMYCIN, TROUGH: Vancomycin Tr: 11 ug/mL — ABNORMAL LOW (ref 15–20)

## 2017-11-22 LAB — BASIC METABOLIC PANEL
Anion gap: 9 (ref 5–15)
BUN: 57 mg/dL — AB (ref 6–20)
CHLORIDE: 107 mmol/L (ref 101–111)
CO2: 26 mmol/L (ref 22–32)
CREATININE: 0.87 mg/dL (ref 0.61–1.24)
Calcium: 9.1 mg/dL (ref 8.9–10.3)
GFR calc Af Amer: >60 mL/min (ref >60)
GFR calc non Af Amer: >60 mL/min (ref >60)
GLUCOSE: 251 mg/dL — AB (ref 65–99)
Potassium: 4.2 mmol/L (ref 3.5–5.1)
Sodium: 142 mmol/L (ref 135–145)

## 2017-11-22 LAB — CRYPTOCOCCAL ANTIGEN, CSF: CRYPTO AG: NEGATIVE

## 2017-11-22 LAB — PHOSPHORUS: Phosphorus: 3.2 mg/dL (ref 2.5–4.6)

## 2017-11-22 LAB — MAGNESIUM: Magnesium: 2.3 mg/dL (ref 1.7–2.4)

## 2017-11-22 MED ORDER — VANCOMYCIN HCL IN DEXTROSE 750-5 MG/150ML-% IV SOLN
750.0000 mg | Freq: Three times a day (TID) | INTRAVENOUS | Status: DC
  Administered 2017-11-22 – 2017-11-23 (×3): 750 mg via INTRAVENOUS
  Filled 2017-11-22 (×4): qty 150

## 2017-11-22 MED ORDER — SODIUM CHLORIDE 0.9 % IV SOLN
500.0000 mg | Freq: Two times a day (BID) | INTRAVENOUS | Status: DC
  Administered 2017-11-23 – 2017-11-27 (×9): 500 mg via INTRAVENOUS
  Filled 2017-11-22 (×3): qty 5
  Filled 2017-11-22 (×2): qty 525
  Filled 2017-11-22: qty 5
  Filled 2017-11-22: qty 525
  Filled 2017-11-22: qty 5
  Filled 2017-11-22 (×2): qty 525

## 2017-11-22 MED ORDER — ADULT MULTIVITAMIN LIQUID CH
15.0000 mL | Freq: Every day | ORAL | Status: DC
  Administered 2017-11-22 – 2017-11-26 (×5): 15 mL
  Filled 2017-11-22 (×6): qty 15

## 2017-11-22 MED ORDER — SODIUM CHLORIDE 0.9 % IV SOLN
2.0000 g | Freq: Two times a day (BID) | INTRAVENOUS | Status: DC
  Administered 2017-11-22: 2 g via INTRAVENOUS
  Filled 2017-11-22 (×2): qty 2

## 2017-11-22 MED ORDER — AMPICILLIN SODIUM 2 G IJ SOLR
2.0000 g | INTRAMUSCULAR | Status: DC
Start: 2017-11-22 — End: 2017-11-22
  Filled 2017-11-22 (×3): qty 2000

## 2017-11-22 MED ORDER — GADOBENATE DIMEGLUMINE 529 MG/ML IV SOLN
15.0000 mL | Freq: Once | INTRAVENOUS | Status: AC | PRN
  Administered 2017-11-22: 15 mL via INTRAVENOUS

## 2017-11-22 MED ORDER — DEXTROSE 5 % IV SOLN
750.0000 mg | Freq: Three times a day (TID) | INTRAVENOUS | Status: DC
  Administered 2017-11-22 (×3): 750 mg via INTRAVENOUS
  Filled 2017-11-22 (×4): qty 15

## 2017-11-22 MED ORDER — SODIUM CHLORIDE 0.9 % IV SOLN
2.0000 g | Freq: Three times a day (TID) | INTRAVENOUS | Status: DC
  Administered 2017-11-22 – 2017-11-23 (×3): 2 g via INTRAVENOUS
  Filled 2017-11-22 (×6): qty 2

## 2017-11-22 MED ORDER — SODIUM CHLORIDE 0.9 % IV SOLN
2.0000 g | INTRAVENOUS | Status: DC
  Administered 2017-11-22 – 2017-11-23 (×9): 2 g via INTRAVENOUS
  Filled 2017-11-22 (×11): qty 2000

## 2017-11-22 MED ORDER — VITAL AF 1.2 CAL PO LIQD
1000.0000 mL | ORAL | Status: DC
  Administered 2017-11-22 – 2017-11-26 (×7): 1000 mL

## 2017-11-22 MED ORDER — FENTANYL CITRATE (PF) 100 MCG/2ML IJ SOLN
50.0000 ug | Freq: Once | INTRAMUSCULAR | Status: AC
  Administered 2017-11-22: 50 ug via INTRAVENOUS

## 2017-11-22 MED FILL — Medication: Qty: 1 | Status: AC

## 2017-11-22 NOTE — Progress Notes (Signed)
Patient continues to be unresponsive.   Reason for consult:   Subjective:   ROS: negative except above  Examination  Vital signs in last 24 hours: Temp:  [98.1 F (36.7 C)-101.2 F (38.4 C)] 98.5 F (36.9 C) (02/26 2000) Pulse Rate:  [66-142] 88 (02/26 2000) Resp:  [14-24] 16 (02/26 2000) BP: (98-161)/(61-144) 116/70 (02/26 2000) SpO2:  [96 %-100 %] 100 % (02/26 2000) FiO2 (%):  [40 %] 40 % (02/26 1955) Weight:  [76 kg (167 lb 8.8 oz)] 76 kg (167 lb 8.8 oz) (02/26 0455)  General: Not in distress, cooperative CVS: pulse-normal rate and rhythm RS: breathing comfortably Extremities: normal   Neuro: Obtunded, intubated Withdraws to pain and localizes in both UE, does not withdraw in both lower extremities Reflexes: absent throughout    Basic Metabolic Panel: Recent Labs  Lab 11/18/17 0421 11/20/17 0155 11/20/17 1042 11/20/17 1701 11/21/17 0440 11/22/17 0418 11/22/17 1047  NA 132* 139  --   --  139 142 145  K 4.5 4.7  --   --  4.0 4.2 4.2  CL 96* 98*  --   --  105 107 110  CO2 26 29  --   --  25 26 27   GLUCOSE 282* 240*  --   --  334* 251* 131*  BUN 46* 54*  --   --  61* 57* 55*  CREATININE 0.70 0.89  --   --  1.09 0.87 0.90  CALCIUM 8.9 10.4*  --   --  9.1 9.1 9.3  MG  --   --  2.0 2.1 2.1 2.3  --   PHOS  --   --  2.4* 1.3* 1.7* 3.2  --     CBC: Recent Labs  Lab 11/16/17 0554 11/20/17 0155 11/21/17 0440 11/22/17 0418 11/22/17 1047  WBC 12.1* 4.3 5.8 12.6* 11.8*  HGB 12.9* 11.6* 9.2* 9.6* 10.0*  HCT 39.0 34.8* 29.1* 30.4* 31.5*  MCV 86.1 87.0 90.4 90.2 91.0  PLT 324 349 306 325 314     Coagulation Studies: No results for input(s): LABPROT, INR in the last 72 hours.  Imaging Reviewed:         ASSESSMENT AND PLAN  62 y.o. male with recent TURP secondary to bladder tumor who presented to OSH on 2/14 with generalized weakness, pelvic pain and numbness for 3 weeks with hyponatremia on presentation. Has progressive weakness of both lower  extremities and diagnosed AIDP, no LP done and patient received IVIG x 5days.  On 2/24 ? Seizure activity and 2/25 aspirated and intubated. Had Vtach and resuscitated in 5 min.    MRI brain shows leptomeningeal enhancement and debris in occpital horns.  LP performed shows glucose <20, cell count of 6, very abnormal protein of 370.   This pattern of CSF of high protein and low Csf can be seen in carcinomatous meningitis, granulomatous menigitis, lymphoma,  AIDP ( but not associated with hypoglycorrhea ) and TB meningitis (less likely usually higher cell count) in immunocompromised patients,   Recommendations ID consult F/u CSF cytology and remaining labs CT chest/abdomen/pelvis   Mindie Rawdon [facility] Pager Number [phone] For questions after 7pm please refer to AMION to reach the Neurologist on call ASSESSMENT AND PLAN

## 2017-11-22 NOTE — Procedures (Signed)
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  Prior EEG revealed left temporal epileptiform discharges.  Medications: Keppra Solu-Medrol Acyclovir Ampicillin Acetaminophen IVIG Novolog Vancomycin   Technical Summary: A multichannel digital EEG recording measured by the international 10-20 system with electrodes applied with paste and impedances below 5000 ohms performed as portable with EKG monitoring in an intubated and poorly responsive patient who is not on sedation.  Hyperventilation and photic stimulation were not performed.  The digital EEG was referentially recorded, reformatted, and digitally filtered in a variety of bipolar and referential montages for optimal display.   Description: The patient is intubated and poorly responsive during the recording.  During maximal wakefulness, there is a symmetric with a significant amount of diffuse 2-3 Hz delta slowing and moderate 4-5 Hz theta slowing of the waking background.  There is no discernible posterior dominant rhythm.  Stage 2 sleep is not seen.  There were no epileptiform discharges or electrographic seizures seen.    EKG lead was unremarkable.  Impression: This EEG is abnormal due to significant diffuse slowing of the waking background.  Clinical Correlation of the above findings indicates diffuse cerebral dysfunction that is non-specific in etiology and can be seen with hypoxic/ischemic injury, toxic/metabolic encephalopathies, neurodegenerative disorders, or medication effect.  The absence of epileptiform discharges does not rule out a clinical diagnosis of epilepsy.  Clinical correlation is advised.  Metta Clines, DO

## 2017-11-22 NOTE — Consult Note (Signed)
PULMONARY / CRITICAL CARE MEDICINE   Name: Raymond [name] MRN: [id number] DOB: 1956/07/02    ADMISSION DATE:  11/18/2017 CONSULTATION DATE:  11/21/17  REFERRING MD:  Dr. Dyann Kief  CHIEF COMPLAINT:  Respiratory failure  BRIEF SUMMARY:  62 year old male with past medical history of CAD, DMT2, HTN, HLD, MI, arthritis, anemia, and TURP (04/2017) secondary to bladder tumor admitted to [facility] on 2/14 with generalized weakness, pelvic pain, and numbness for 3 months.  He was seen in office on 2/14 by primary MD with reported 3 month hx of weight loss (40lbs) and functional decline.  He was referred for direct admit at [facility].    Initial presentation felt to be due to hyponatremia which was noted on admit, but symptoms persisted after sodium was corrected.  He had MRI that was negative. 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  Course complicated by sepsis from aspiration vs HCAP and UTI.  He intermittently required BiPAP.  On 2/25, he was on BiPAP and due to concern for further decline and increased risk of aspiration, he was subsequently intubated.  He suffered a brief VT arrest post intubation.  The patient was transferred to [facility] 2/25 for further evaluation and management.    SUBJECTIVE:  RN reports pt not waking.  Pending MRI of head.  No sedation since 2/25 (fentanyl per EMS).    VITAL SIGNS: BP (!) 161/144 (BP Location: Right Arm)   Pulse 90   Temp (!) 100.9 F (38.3 C) (Axillary)   Resp (!) 23   Ht 6\' 1"  (1.854 m)   Wt 167 lb 8.8 oz (76 kg)   SpO2 100%   BMI 22.11 kg/m   HEMODYNAMICS:    VENTILATOR SETTINGS: Vent Mode: PRVC FiO2 (%):  [40 %] 40 % Set Rate:  [14 bmp] 14 bmp Vt Set:  [550 mL-640 mL] 550 mL PEEP:  [5 cmH20] 5 cmH20 Plateau Pressure:  [13 cmH20-16 cmH20] 16 cmH20  INTAKE / OUTPUT: I/O last 3 completed shifts: In: 2731.1 [I.V.:321.1; Other:175; NG/GT:950; IV Piggyback:1285] Out: 2550  [Urine:2550]  PHYSICAL EXAMINATION: General: adult male in NAD lying in bed on vent HEENT: MM pink/moist, ETT, large beard Neuro: pupils 37mm, flicker of movement with pain, no follow commands   CV: s1s2 rrr, no m/r/g PULM: even/non-labored, lungs bilaterally coarse  JS:EGBT, non-tender, bsx4 active  Extremities: warm/dry, no edema  Skin: no rashes, reported sacral ulcer present on admit   LABS:  BMET Recent Labs  Lab 11/20/17 0155 11/21/17 0440 11/22/17 0418  NA 139 139 142  K 4.7 4.0 4.2  CL 98* 105 107  CO2 29 25 26   BUN 54* 61* 57*  CREATININE 0.89 1.09 0.87  GLUCOSE 240* 334* 251*    Electrolytes Recent Labs  Lab 11/20/17 0155  11/20/17 1701 11/21/17 0440 11/22/17 0418  CALCIUM 10.4*  --   --  9.1 9.1  MG  --    < > 2.1 2.1 2.3  PHOS  --    < > 1.3* 1.7* 3.2   < > = values in this interval not displayed.    CBC Recent Labs  Lab 11/20/17 0155 11/21/17 0440 11/22/17 0418  WBC 4.3 5.8 12.6*  HGB 11.6* 9.2* 9.6*  HCT 34.8* 29.1* 30.4*  PLT 349 306 325    Coag's No results for input(s): APTT, INR in the last 168 hours.  Sepsis Markers Recent Labs  Lab 11/20/17 0155  PROCALCITON 1.67    ABG Recent Labs  Lab 11/21/17 0433 11/21/17 1725 11/22/17 0116  PHART 7.511* 7.480* 7.485*  PCO2ART 34.6 38.2 38.7  PO2ART 98.3 114* 114*    Liver Enzymes Recent Labs  Lab 11/21/17 0440  AST 83*  ALT 42  ALKPHOS 36*  BILITOT 0.4  ALBUMIN 2.0*    Cardiac Enzymes Recent Labs  Lab 11/20/17 1414 11/20/17 2128 11/21/17 0211  TROPONINI 0.63* 0.96* 0.74*    Glucose Recent Labs  Lab 11/21/17 1146 11/21/17 1637 11/21/17 2018 11/21/17 2348 11/22/17 0333 11/22/17 0804  GLUCAP 262* 262* 303* 288* 252* 180*    Imaging Dg Chest Port 1 View  Result Date: 11/22/2017 CLINICAL DATA:  Respiratory failure, history of hypertension, diabetes, and previous MI. Nonsmoker. EXAM: PORTABLE CHEST 1 VIEW COMPARISON:  Portable chest x-ray of November 21, 2017 FINDINGS: The lungs are well-expanded. There is persistent atelectasis or pneumonia at the left lung base. The right lung is clear. The heart and pulmonary vascularity are normal. The endotracheal tube tip projects 4.7 cm above the carina. The esophagogastric tube tip and proximal port project below the inferior margin of the image. IMPRESSION: Persistent left lower lobe atelectasis or pneumonia. The support tubes are in reasonable position. Electronically Signed   By: David  Martinique M.D.   On: 11/22/2017 08:50    STUDIES:  MRI Cervical / Lumbar / Thoracic Spine >> no acute / traumatic finding, central disc herniation T7-8, T8-9 without neural compression, central to right sided disc herniation at T9-10 without likely neural compression, T10-11 central disc herniation with upward migration behind T10 with some indentation of the left side of the cord, ample subarachnoid space present dorsal to the cord, compression unlikely EEG 2/25 > several episodes of left temporal epileptiform activity, 1 sec burst suppression pattern. Echo 2/25 > EF 65-70%. EEG 2/26 >> abnormal due to significant diffuse slowing of the waking background  CULTURES: Urine 2/24 >> pseudomonas >> pan sensitive   BCx2 2/24 >>   ANTIBIOTICS: Zosyn 2/24 >> 2/25 Vanc 2/24 >>  Cefepime 2/26 >>  Ampicillin 2/26 >>  Acyclovir 2/26 >>   SIGNIFICANT EVENTS: 2/14  Admit. 2/18  Neuro consult. 2/24  Evening episode of possible seizure, EEG with left temporal epileptiform activity  2/24  Intubated. 2/25  Episode of VT > brief code.  Transfer to [facility].   LINES/TUBES: ETT 2/24 >>   DISCUSSION: 62 y.o. M admitted to [facility] 2/14 with generalized weakness.  Initially felt to be due to hyponatremia but no change after Na corrected.  Seen by neurology who felt that this could represent GBS.  He was started on IVIG x 5 days but did not have significant improvement.  On 2/24, he required intubation.  He was then transferred to [facility] for further  management.  ASSESSMENT / PLAN:  PULMONARY A: Respiratory insufficiency - due to inability to protect the airway in setting of possible GBS. Concern for aspiration vs HCAP. P:   PRVC 8 cc/kg Wean PEEP / FiO2 for sats > 90% Daily SBT / WUA  See ID  Follow intermittent CXR   CARDIOVASCULAR A:  Sinus tach. VT Arrest - following intubation, resolved. Hx MI, HTN, HLD, CAD. P:  ICU monitoring  PRN lopressor   RENAL A:   Hypophosphatemia - s/p repletion. Hyponatremia - resolved  P:   Trend BMP / urinary output Replace electrolytes as indicated Avoid nephrotoxic agents, ensure adequate renal perfusion  GASTROINTESTINAL A:   Severe  Protein Calorie Malnutrition / Weight Loss - reported 40lb weight loss in months prior to admit  Nutrition. Protein calorie malnutrition. GI prophylaxis. P:   NPO / OGT TF for nutrition  PPI for SUP   HEMATOLOGIC A:   Anemia. VTE prophylaxis. P:  Trend CBC  SCD's + Lovenox for DVT prophylaxis   INFECTIOUS A:   Concern for Aspiration vs HCAP. Possible UTI. P:   Empiric abx as above Trend PCT  Follow cultures  May need LP   ENDOCRINE A:   Hx DM. P:   Continue levemir, SSI  NEUROLOGIC A:   Acute Encephalopathy - metabolic, +/- anoxic, Neuro concerned for possible meningitis  Generalized weakness - question if his initial presentation was related to malnutrition / deconditioning in the setting of cancer  Sedation due to mechanical ventilation. Concern for GBS - started on IVIG x 5 days but did not have improvement. Seizures - EEG 2/25 with several episodes of left temporal epileptiform activity, 1 sec burst suppression pattern. P:   Sedation: PRN fentanyl / versed . RASS goal: 0  Minimize sedation as able  Neurology following, appreciate input  Follow serial neuro exam  Continue Keppra Repeat MRI Brain per Neuro  IVIG per Neuro  LP under fluoroscopy with sedation recommended per Neuro   UROLOGY A: S/p  TURP. P: Follow up with Urology at discharge   FAMILY  - Updates: No family available on 2/26 rounds.  Updated per PCCM team 2/25 pm.    - Inter-disciplinary family meet or Palliative Care meeting due by:  2017/12/18.  CC Time:  Amasa, NP-C [facility] Pgr: [phone] or if no answer [phone] 11/22/2017, 11:09 AM

## 2017-11-22 NOTE — Consult Note (Signed)
[place] Nurse wound consult note Reason for Consult: requested by bedside nursing staff to evaluate sacral pressure injury found upon admission to [facility] 4N Wound type: deep tissue pressure injury Pressure Injury POA: No (POA to [facility] 4N) but not present on admission to [facility] ICU Wound bed: deep dark purple, does not blanch with superficial skin peeling, noted on 2/18-19 to be blister in same area Drainage (amount, consistency, odor) minimal, no odor Periwound:intact  Dressing procedure/placement/frequency: Silicone foam to protect and absorb moisture from the area. Monitor each shift for evolution of pressure injury.  [facility] ICU bed in place for moisture management and pressure redistribution.  [place] Nurse team will follow along with you for weekly wound assessments.  Please notify me of any acute changes in the wounds or any new areas of concerns [place] MSN, RN,CWOCN, [place], [place]

## 2017-11-22 NOTE — Progress Notes (Signed)
PT Cancellation Note  Patient Details Name: Raymond [name] MRN: [id number] DOB: Jul 11, 1956   Cancelled Treatment:    Reason Eval/Treat Not Completed: Medical issues which prohibited therapy(pt with medical decline, transferred here and intubated. Will sign off and await new order for medical stability)   Roby Spalla B Jaiden Wahab 11/22/2017, 7:04 AM  Elwyn Reach, [place]

## 2017-11-22 NOTE — Progress Notes (Signed)
Pharmacy Antibiotic Note  Raymond [name] is a 62 y.o. male admitted on 11/13/2017 with multiple issues, notably progressive weakness. Has been receiving IVIG for possible GBS. He also reportedly had a code blue with ROSC at outside hospital and may have had an anoxic period. Pt was started on vancomycin and zosyn for sepsis/UTI. His urine culture is growing pseudomonas (awaitng sensitivities). Neurology would like to broaden antibiotics to cover meningitis organisms.   Vancomycin trough = 11, subtherapeutic on 1g Q 12 hrs. Renal function stable. LP done this afternoon.  Plan: Increase vancomycin dose to 750 mg IV Q 8 hrs    Height: 6\' 1"  (185.4 cm) Weight: 167 lb 8.8 oz (76 kg) IBW/kg (Calculated) : 79.9  Temp (24hrs), Avg:99.7 F (37.6 C), Min:98.2 F (36.8 C), Max:101.2 F (38.4 C)  Recent Labs  Lab 11/16/17 0554  11/18/17 0421 11/20/17 0155 11/21/17 0440 11/22/17 0418 11/22/17 1047 11/22/17 1645  WBC 12.1*  --   --  4.3 5.8 12.6* 11.8*  --   CREATININE 0.77   < > 0.70 0.89 1.09 0.87 0.90  --   VANCOTROUGH  --   --   --   --   --   --   --  11*   < > = values in this interval not displayed.    Antimicrobials this admission: 2/24 zosyn > 2/25 2/24 vancomycin > 2/26 cefepime > 2/26 ampicillin > 2/26 acyclovir >  Dose adjustments this admission: N/A  Microbiology results: 2/22 mrsa pcr: neg 2/24 blood cx: ngtd 2/24 urine cx: pseudomonas 2/26 CSF:   Maryanna Shape, PharmD, BCPS  Clinical Pharmacist  Pager: [phone]   11/22/2017 5:56 PM

## 2017-11-22 NOTE — Progress Notes (Signed)
Pharmacy Antibiotic Note  Raymond [name] is a 62 y.o. male admitted on 11/24/2017 with multiple issues, notably progressive weakness. Has been receiving IVIG for possible GBS. He also reportedly had a code blue with ROSC at outside hospital and may have had an anoxic period. Pt was started on vancomycin and zosyn for sepsis/UTI. His urine culture is growing pseudomonas (awaitng sensitivities). Neurology would like to broaden antibiotics to cover meningitis organisms.    Plan: -Continue vancomycin 1 g IV q12h and check trough at steady state -Ampicillin 2 g IV q4h -Acyclovir 750 mg IV q8h -Cefepime 2 g IV q12h -Monitor renal fx, cultures, f/u LP    Height: 6\' 1"  (185.4 cm) Weight: 166 lb 7.2 oz (75.5 kg) IBW/kg (Calculated) : 79.9  Temp (24hrs), Avg:100.2 F (37.9 C), Min:98.2 F (36.8 C), Max:102 F (38.9 C)  Recent Labs  Lab 11/16/17 0554 11/17/17 0506 11/18/17 0421 11/20/17 0155 11/21/17 0440  WBC 12.1*  --   --  4.3 5.8  CREATININE 0.77 0.70 0.70 0.89 1.09    Antimicrobials this admission: 2/24 zosyn > 2/25 2/24 vancomycin > 2/26 cefepime > 2/26 ampicillin > 2/26 acyclovir >  Dose adjustments this admission: N/A  Microbiology results: 2/22 mrsa pcr: neg 2/24 blood cx: ngtd 2/24 urine cx: pseudomonas   Harvel Quale 11/22/2017 12:44 AM

## 2017-11-22 NOTE — Progress Notes (Signed)
Upon Assessment, pt's tube feed was alarming. I went to check placement and OG tube had been pulled out and was sitting in the back of the pt's throat. OG was removed and replaced. X Ray has been ordered to confirm placement. Will continue to monitor.

## 2017-11-22 NOTE — Progress Notes (Signed)
Pharmacy Antibiotic Note  Raymond [name] is a 62 y.o. male admitted on 11/20/2017 with multiple issues, notably progressive weakness. Has been receiving IVIG for possible GBS. He also reportedly had a code blue with ROSC at outside hospital and may have had an anoxic period. Pt was started on vancomycin and zosyn for sepsis/UTI. His urine culture is growing pseudomonas (awaitng sensitivities). Neurology would like to broaden antibiotics to cover meningitis organisms.    Plan: -Continue vancomycin 1 g IV q12h and check trough at steady state at 1730 on 2/26 -Ampicillin 2 g IV q4h -Acyclovir 750 mg IV q8h -Cefepime to 2 g IV q8h -Monitor renal fx, cultures, f/u LP, HSV pcr?    Height: 6\' 1"  (185.4 cm) Weight: 167 lb 8.8 oz (76 kg) IBW/kg (Calculated) : 79.9  Temp (24hrs), Avg:99.8 F (37.7 C), Min:98.2 F (36.8 C), Max:101.7 F (38.7 C)  Recent Labs  Lab 11/16/17 0554 11/17/17 0506 11/18/17 0421 11/20/17 0155 11/21/17 0440 11/22/17 0418  WBC 12.1*  --   --  4.3 5.8 12.6*  CREATININE 0.77 0.70 0.70 0.89 1.09 0.87    Antimicrobials this admission: 2/24 zosyn > 2/25 2/24 vancomycin > 2/26 cefepime > 2/26 ampicillin > 2/26 acyclovir >  Dose adjustments this admission: N/A  Microbiology results: 2/22 mrsa pcr: neg 2/24 blood cx: ngtd 2/24 urine cx: pseudomonas   Elicia Lamp, PharmD, BCPS Clinical Pharmacist 11/22/2017 9:58 AM

## 2017-11-22 NOTE — Progress Notes (Signed)
Nutrition Follow-up  DOCUMENTATION CODES:   Severe malnutrition in context of chronic illness  INTERVENTION:   D/C Vital High Protein D/C Prostat  Vital AF 1.2 @ 35 ml/hr and increase by 10 ml every 12 hours to goal rate of 75 ml/hr (1800 ml/day)  Provides:  2160 kcal, 135 grams protein, and 1459 ml free water.   Monitor magnesium, potassium, and phosphorus every 12 hours x 4 occurences, MD to replete as needed, as pt is at high risk for refeeding syndrome given severe malnutrition.   NUTRITION DIAGNOSIS:   Severe Malnutrition(Chronic context) related to (Abdominal pian (worse w/ intake), weakness, lack of any appetite,) as evidenced by energy intake < or equal to 75% for > or equal to 1 month, percent weight loss. Ongoing.   GOAL:   Patient will meet greater than or equal to 90% of their needs Progressing.   MONITOR:   PO intake, Supplement acceptance, Diet advancement, Labs, Weight trends  REASON FOR ASSESSMENT:   Consult Enteral/tube feeding initiation and management  ASSESSMENT:   Pt with PMH of DM, HTN, HLD, CAD, anemia, recent TURP last July and August for bladder tumor admitted 2/14 to [facility] generalized pelvic pain & numbness/weakness of BLE x3 weeks. Reports loss of 35 lbs x3 months due to decreased appetite and abd pain/distention. Pt found to have hyponatremia which was treated. 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 Pt with sepsis from aspiration vs HCAP and UTI. Has required intermittent BiPAP. On 2/25 pt was intubated then suffered a brief VT arrest and transferred to [facility]. Pt noted to develop stage II pressure injury on sacrum.    Pt admitted to [facility] 2/14. On admission pt met criteria for severe malnutrition. He continued to have very poor intake during this time (13 days) due to lethargy and dysphagia. Documented calorie count at [facility] shows pt only eating bites at meals. Family feeding patient.   Pt discussed during ICU  rounds and with RN.  MRI and LP pending, neurology following for possible GBS vs meningitis  Pt out of room in MRI Keppra started due to seizures   Patient is currently intubated on ventilator support MV: 10.4 L/min Temp (24hrs), Avg:99.7 F (37.6 C), Min:98.2 F (36.8 C), Max:101.2 F (38.4 C)  Medications reviewed and include: SSI, levemir, solumedrol, Kphos x 4 2/25 Labs reviewed CBG: 111 UOP: 2550 ml Pt is negative 9 L  Diet Order:  Diet NPO time specified  EDUCATION NEEDS:   No education needs have been identified at this time  Skin:  Skin Integrity Issues:: Stage II(MASD - buttocks) Stage II: sacrum  Last BM:  2/25 small  Height:   Ht Readings from Last 1 Encounters:  11/21/17 '6\' 1"'  (1.854 m)    Weight:   Wt Readings from Last 1 Encounters:  11/22/17 167 lb 8.8 oz (76 kg)    Ideal Body Weight:  83.6 kg  BMI:  Body mass index is 22.11 kg/m.  Estimated Nutritional Needs:   Kcal:  2150  Protein:  120-145 grams  Fluid:  >2.4 L (30 ml/kg bw)  Maylon Peppers RD, [place], [place] Pager [phone] After Hours Pager

## 2017-11-22 NOTE — Procedures (Signed)
Lumbar Puncture Procedure Note  Indications: Diagnostic  Procedure Details   Consent: Informed consent was obtained. Risks of the procedure were discussed including: infection, bleeding, pain and headache.  Premedicated with fentanyl 50 mcg.  The patient was positioned under sterile conditions. Betadine solution and sterile drapes were utilized. A spinal needle was inserted at the space L4 - L5 interspace.  Spinal fluid was obtained and sent to the laboratory.  Findings 52mL of clear yellowish spinal fluid was obtained. Opening Pressure: 15 cm H2O pressure.  Complications:  None; patient tolerated the procedure well.        Condition: stable  Plan Bed rest for 3 hours.  Kennieth Rad, AGACNP-BC [facility] Pgr: [phone] or if no answer [phone] 11/22/2017, 4:14 PM

## 2017-11-22 NOTE — Progress Notes (Signed)
EEG completed; results pending.    

## 2017-11-23 ENCOUNTER — Inpatient Hospital Stay (HOSPITAL_COMMUNITY): Payer: Federal, State, Local not specified - PPO

## 2017-11-23 DIAGNOSIS — Z8551 Personal history of malignant neoplasm of bladder: Secondary | ICD-10-CM

## 2017-11-23 DIAGNOSIS — G009 Bacterial meningitis, unspecified: Secondary | ICD-10-CM

## 2017-11-23 DIAGNOSIS — M19042 Primary osteoarthritis, left hand: Secondary | ICD-10-CM

## 2017-11-23 DIAGNOSIS — I252 Old myocardial infarction: Secondary | ICD-10-CM

## 2017-11-23 DIAGNOSIS — E46 Unspecified protein-calorie malnutrition: Secondary | ICD-10-CM

## 2017-11-23 DIAGNOSIS — E119 Type 2 diabetes mellitus without complications: Secondary | ICD-10-CM

## 2017-11-23 DIAGNOSIS — Z9079 Acquired absence of other genital organ(s): Secondary | ICD-10-CM

## 2017-11-23 DIAGNOSIS — M1712 Unilateral primary osteoarthritis, left knee: Secondary | ICD-10-CM

## 2017-11-23 DIAGNOSIS — Z833 Family history of diabetes mellitus: Secondary | ICD-10-CM

## 2017-11-23 DIAGNOSIS — I10 Essential (primary) hypertension: Secondary | ICD-10-CM

## 2017-11-23 DIAGNOSIS — D649 Anemia, unspecified: Secondary | ICD-10-CM

## 2017-11-23 DIAGNOSIS — M19041 Primary osteoarthritis, right hand: Secondary | ICD-10-CM

## 2017-11-23 DIAGNOSIS — I2510 Atherosclerotic heart disease of native coronary artery without angina pectoris: Secondary | ICD-10-CM

## 2017-11-23 DIAGNOSIS — Z9911 Dependence on respirator [ventilator] status: Secondary | ICD-10-CM

## 2017-11-23 DIAGNOSIS — J690 Pneumonitis due to inhalation of food and vomit: Secondary | ICD-10-CM

## 2017-11-23 DIAGNOSIS — Z8249 Family history of ischemic heart disease and other diseases of the circulatory system: Secondary | ICD-10-CM

## 2017-11-23 DIAGNOSIS — E785 Hyperlipidemia, unspecified: Secondary | ICD-10-CM

## 2017-11-23 LAB — BASIC METABOLIC PANEL
Anion gap: 9 (ref 5–15)
BUN: 51 mg/dL — AB (ref 6–20)
CHLORIDE: 107 mmol/L (ref 101–111)
CO2: 25 mmol/L (ref 22–32)
CREATININE: 0.85 mg/dL (ref 0.61–1.24)
Calcium: 8.6 mg/dL — ABNORMAL LOW (ref 8.9–10.3)
GFR calc non Af Amer: >60 mL/min (ref >60)
Glucose, Bld: 294 mg/dL — ABNORMAL HIGH (ref 65–99)
POTASSIUM: 4.2 mmol/L (ref 3.5–5.1)
SODIUM: 141 mmol/L (ref 135–145)

## 2017-11-23 LAB — MAGNESIUM
MAGNESIUM: 2.3 mg/dL (ref 1.7–2.4)
MAGNESIUM: 2.2 mg/dL (ref 1.7–2.4)

## 2017-11-23 LAB — C DIFFICILE QUICK SCREEN W PCR REFLEX
C Diff antigen: NEGATIVE
C Diff interpretation: NOT DETECTED
C Diff toxin: NEGATIVE

## 2017-11-23 LAB — CBC
HEMATOCRIT: 25.8 % — AB (ref 39.0–52.0)
HEMOGLOBIN: 8.1 g/dL — AB (ref 13.0–17.0)
MCH: 29.1 pg (ref 26.0–34.0)
MCHC: 31.4 g/dL (ref 30.0–36.0)
MCV: 92.8 fL (ref 78.0–100.0)
Platelets: 219 10*3/uL (ref 150–400)
RBC: 2.78 MIL/uL — ABNORMAL LOW (ref 4.22–5.81)
RDW: 14.8 % (ref 11.5–15.5)
WBC: 13.1 10*3/uL — ABNORMAL HIGH (ref 4.0–10.5)

## 2017-11-23 LAB — GLUCOSE, CAPILLARY
GLUCOSE-CAPILLARY: 252 mg/dL — AB (ref 65–99)
GLUCOSE-CAPILLARY: 263 mg/dL — AB (ref 65–99)
GLUCOSE-CAPILLARY: 281 mg/dL — AB (ref 65–99)
Glucose-Capillary: 224 mg/dL — ABNORMAL HIGH (ref 65–99)
Glucose-Capillary: 276 mg/dL — ABNORMAL HIGH (ref 65–99)
Glucose-Capillary: 247 mg/dL — ABNORMAL HIGH (ref 65–99)

## 2017-11-23 LAB — PHOSPHORUS
PHOSPHORUS: 3.5 mg/dL (ref 2.5–4.6)
Phosphorus: 3.5 mg/dL (ref 2.5–4.6)

## 2017-11-23 LAB — CK: CK TOTAL: 564 U/L — AB (ref 49–397)

## 2017-11-23 MED ORDER — SODIUM CHLORIDE 0.9 % IV SOLN
3.0000 g | Freq: Four times a day (QID) | INTRAVENOUS | Status: DC
  Administered 2017-11-23 – 2017-11-27 (×16): 3 g via INTRAVENOUS
  Filled 2017-11-23 (×18): qty 3

## 2017-11-23 MED ORDER — PANTOPRAZOLE SODIUM 40 MG PO PACK
40.0000 mg | PACK | Freq: Every day | ORAL | Status: DC
  Administered 2017-11-24 – 2017-11-26 (×3): 40 mg
  Filled 2017-11-23 (×4): qty 20

## 2017-11-23 NOTE — Progress Notes (Signed)
Inpatient Diabetes Program Recommendations  AACE/ADA: New Consensus Statement on Inpatient Glycemic Control (2015)  Target Ranges:  Prepandial:   less than 140 mg/dL      Peak postprandial:   less than 180 mg/dL (1-2 hours)      Critically ill patients:  140 - 180 mg/dL   Lab Results  Component Value Date   GLUCAP 224 (H) 11/23/2017   HGBA1C 9.1 (H) 11/12/2017    Review of Glycemic ControlResults for Raymond [name], Raymond [name] (MRN [id number]) as of 11/23/2017 12:26  Ref. Range 11/22/2017 19:24 11/22/2017 23:34 11/23/2017 03:34 11/23/2017 08:07 11/23/2017 11:55  Glucose-Capillary Latest Ref Range: 65 - 99 mg/dL 187 (H) 239 (H) 252 (H) 263 (H) 224 (H)   Diabetes history: Type 2 DM Outpatient Diabetes medications:  Humalog 75/25- 35 units in the AM and 30 units in the evening Metformin 1000 mg bid Victoza 1.8 mg daily Current orders for Inpatient glycemic control:  Levemir 18 units bid, Novolog resistant q 4 hours  Inpatient Diabetes Program Recommendations:   Note patient is on tube feeds and blood sugars>200 mg/dL.  May consider adding Novolog tube feed coverage 3 units q 4 hours.    Thanks,  Adah Perl, RN, BC-ADM Inpatient Diabetes Coordinator Pager [phone] (8a-5p)

## 2017-11-23 NOTE — Progress Notes (Addendum)
Family states patient received BCG therapy for bladder cancer, works in farm.  ID has been consulted, appreciate recs.  However CSF cytology shows malignant cells, presentation consistent with carcinomatous meningitis. Please consult Oncology. Discontinued IVIG.

## 2017-11-23 NOTE — Progress Notes (Addendum)
PULMONARY / CRITICAL CARE MEDICINE   Name: Raymond [name] MRN: [id number] DOB: 12/25/1955    ADMISSION DATE:  11/15/2017 CONSULTATION DATE:  11/21/17  REFERRING MD:  Dr. Dyann Kief  CHIEF COMPLAINT:  Respiratory failure  BRIEF SUMMARY:  62 year old male with past medical history of CAD, DMT2, HTN, HLD, MI, arthritis, anemia, and TURP (04/2017) secondary to bladder tumor admitted to [facility] on 2/14 with generalized weakness, pelvic pain, and numbness for 3 months.  He was seen in office on 2/14 by primary MD with reported 3 month hx of weight loss (40lbs) and functional decline.  He was referred for direct admit at [facility].    Initial presentation felt to be due to hyponatremia which was noted on admit, but symptoms persisted after sodium was corrected.  He had MRI that was negative. 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  Course complicated by sepsis from aspiration vs HCAP and UTI.  He intermittently required BiPAP.  On 2/25, he was on BiPAP and due to concern for further decline and increased risk of aspiration, he was subsequently intubated.  He suffered a brief VT arrest post intubation.  The patient was transferred to [facility] 2/25 for further evaluation and management.    SUBJECTIVE:  Family at bedside.  RN reports diarrhea (soft, not liquid) x2.  No acute events overnight.  Pt weaning on PSV.   VITAL SIGNS: BP (!) 128/58   Pulse 80   Temp 100 F (37.8 C) (Axillary)   Resp 17   Ht 6\' 1"  (1.854 m)   Wt 167 lb 12.3 oz (76.1 kg)   SpO2 98%   BMI 22.13 kg/m   HEMODYNAMICS:    VENTILATOR SETTINGS: Vent Mode: PRVC FiO2 (%):  [35 %-40 %] 35 % Set Rate:  [14 bmp] 14 bmp Vt Set:  [550 mL] 550 mL PEEP:  [5 cmH20] 5 cmH20 Plateau Pressure:  [12 cmH20-16 cmH20] 13 cmH20  INTAKE / OUTPUT: I/O last 3 completed shifts: In: 4592.1 [I.V.:1373.4; Other:130; NG/GT:903.8; IV Piggyback:2185] Out: 2580  [Urine:2580]  PHYSICAL EXAMINATION: General: thin adult male in NAD on vent   HEENT: MM pink/moist, ETT  Neuro: opens eyes, will track at times, able to give a thumbs up x2 on the LUE CV: s1s2 rrr, no m/r/g PULM: even/non-labored, lungs bilaterally clear  YP:PJKD, non-tender, bsx4 active  Extremities: warm/dry, no edema  Skin: no rashes, small sacral pressure injury (see WOC notes)  LABS:  BMET Recent Labs  Lab 11/22/17 0418 11/22/17 1047 11/23/17 0511  NA 142 145 141  K 4.2 4.2 4.2  CL 107 110 107  CO2 26 27 25   BUN 57* 55* 51*  CREATININE 0.87 0.90 0.85  GLUCOSE 251* 131* 294*    Electrolytes Recent Labs  Lab 11/21/17 0440 11/22/17 0418 11/22/17 1047 11/23/17 0511  CALCIUM 9.1 9.1 9.3 8.6*  MG 2.1 2.3  --  2.3  PHOS 1.7* 3.2  --  3.5    CBC Recent Labs  Lab 11/22/17 0418 11/22/17 1047 11/23/17 0511  WBC 12.6* 11.8* 13.1*  HGB 9.6* 10.0* 8.1*  HCT 30.4* 31.5* 25.8*  PLT 325 314 219    Coag's No results for input(s): APTT, INR in the last 168 hours.  Sepsis Markers Recent Labs  Lab 11/20/17 0155  PROCALCITON 1.67    ABG Recent Labs  Lab 11/21/17 0433 11/21/17 1725 11/22/17 0116  PHART 7.511* 7.480* 7.485*  PCO2ART 34.6 38.2 38.7  PO2ART 98.3 114* 114*    Liver Enzymes Recent Labs  Lab 11/21/17 0440 11/22/17 1047  AST 83* 92*  ALT 42 60  ALKPHOS 36* 39  BILITOT 0.4 0.2*  ALBUMIN 2.0* 1.8*    Cardiac Enzymes Recent Labs  Lab 11/20/17 1414 11/20/17 2128 11/21/17 0211  TROPONINI 0.63* 0.96* 0.74*    Glucose Recent Labs  Lab 11/22/17 1140 11/22/17 1544 11/22/17 1924 11/22/17 2334 11/23/17 0334 11/23/17 0807  GLUCAP 111* 151* 187* 239* 252* 263*    Imaging Mr Brain W Wo Contrast  Result Date: 11/22/2017 CLINICAL DATA:  62 year old male with encephalopathy, seizure activity, unresponsive. EXAM: MRI HEAD WITHOUT AND WITH CONTRAST TECHNIQUE: Multiplanar, multiecho pulse sequences of the brain and surrounding  structures were obtained without and with intravenous contrast. CONTRAST:  54mL MULTIHANCE GADOBENATE DIMEGLUMINE 529 MG/ML IV SOLN COMPARISON:  [facility] brain MRI with intracranial MRA a 11/15/2017 FINDINGS: Brain: No restricted diffusion or evidence of acute infarction. However, there is persistent layering debris in the bilateral occipital horns which does demonstrate abnormal diffusion (series 4, image 20) but no T2* signal loss. Associated occipital horn periventricular T2 and FLAIR hyperintensity also appears mildly progressed since 11/15/2017. No other intraventricular debris identified. Following contrast, no definite abnormal ependymal or subependymal enhancement is identified. No increased periventricular FLAIR signal abnormality elsewhere, although there are several nonspecific foci of temporal horn periventricular FLAIR hyperintensity such as series 8, image 10 on the right. There is possible hyperdynamic flow at the cerebral aqueduct. No superimposed extra-axial collection or extra-axial diffusion restriction. No pachymeningeal thickening or enhancement. However, there is questionable abnormal enhancement of the leptomeninges within the bilateral internal auditory canals (series 13, images 10 and 11). This does not appear associated with discrete IAC soft tissue masses. However, the basilar turn of the right cochlea may also be abnormally enhancing (series 13, image 10). The other visible internal auditory structures appear grossly normal. Questionable also hyperenhancement of the cisternal right 5th nerve on series 14, image 11. No additional cranial nerve enhancement identified. Hippocampal complex signal and morphology appears symmetric and within normal limits. Mild for age scattered nonspecific parenchymal white matter T2 and FLAIR hyperintensity is stable. No other new parenchymal signal abnormality and abnormal parenchymal enhancement. Basilar cisterns appear normal. No midline shift,  mass effect, evidence of mass lesion or acute intracranial hemorrhage. Cervicomedullary junction and pituitary are within normal limits. Vascular: Major intracranial vascular flow voids are preserved and stable. Skull and upper cervical spine: Negative visible cervical spine. Normal bone marrow signal. Sinuses/Orbits: Normal orbits soft tissues aside from mild Disconjugate gaze. Paranasal Visualized paranasal sinuses and mastoids are stable and well pneumatized. Other: Scalp and face soft tissues appear negative. IMPRESSION: 1. Persistent abnormal signal layering in both occipital horns favored to be debris (see #2,3) rather than blood products. No associated intraventricular/subependymal enhancement. 2. Superimposed abnormal enhancement of the bilateral 7/8th cranial nerves, the basal turn of the right cochlea, and possibly also the cisternal segment of the right 5th nerve. This appears to be abnormal leptomeningeal type enhancement rather than due to cranial nerve masses. And although there is not widespread intracranial leptomeningeal abnormality I do note diffuse abnormal leptomeningeal enhancement of the lower thoracic spinal cord, conus, and cauda equina on 11/11/2017. 3. Recommend CSF analysis. This constellation of findings could reflect leptomeningeal infection, noninfectious inflammation, or less likely carcinomatosis. Electronically Signed   By: Genevie Ann M.D.   On: 11/22/2017 13:58   Dg Abd Portable 1v  Result Date: 11/22/2017 CLINICAL DATA:  OG tube placement EXAM: PORTABLE ABDOMEN - 1 VIEW COMPARISON:  CT 11/14/2017 FINDINGS: Esophageal tube tip projects over the proximal stomach. Visible gas pattern is unremarkable IMPRESSION: Esophageal tube tip overlies the proximal stomach. Electronically Signed   By: Donavan Foil M.D.   On: 11/22/2017 20:51    STUDIES:  MRI Cervical / Lumbar / Thoracic Spine >> no acute / traumatic finding, central disc herniation T7-8, T8-9 without neural compression,  central to right sided disc herniation at T9-10 without likely neural compression, T10-11 central disc herniation with upward migration behind T10 with some indentation of the left side of the cord, ample subarachnoid space present dorsal to the cord, compression unlikely EEG 2/25 > several episodes of left temporal epileptiform activity, 1 sec burst suppression pattern. Echo 2/25 > EF 65-70%. EEG 2/26 >> abnormal due to significant diffuse slowing of the waking background MRI Brain 2/26 >> persistent abnormal signal layering in both occipital horns favored to be debris rather than blood, superimposed abnormal enhancement of the bilateral 7/8th cranial nerves, the basal turn of the right cochlea, & possibly also the cisternal segment of the R 5th nerve, diffuse abnormal leptomeningeal enhancement of the lower thoracic spinal cord, conus and cauda equina   CULTURES: Urine 2/24 >> pseudomonas >> pan sensitive   BCx2 2/24 >>  LP 2/26 >>  Crypto Ag 2/26 >> negative CSF AFB 2/27 >>  ANTIBIOTICS: Zosyn 2/24 >> 2/25 Vanc 2/24 >>  Cefepime 2/26 >>  Ampicillin 2/26 >>  Acyclovir 2/26 >>   SIGNIFICANT EVENTS: 2/14  Admit. 2/18  Neuro consult. 2/24  Evening episode of possible seizure, EEG with left temporal epileptiform activity  2/24  Intubated. 2/25  Episode of VT > brief code.  Transfer to [facility].  2/27  LP - glucose <20, RBC 7, other cells, WBC 6  LINES/TUBES: ETT 2/24 >>   DISCUSSION: 62 y.o. M admitted to [facility] 2/14 with generalized weakness.  Initially felt to be due to hyponatremia but no change after Na corrected.  Seen by neurology who felt that this could represent GBS.  He was started on IVIG x 5 days but did not have significant improvement.  On 2/24, he required intubation.  He was then transferred to [facility] for further management.  ASSESSMENT / PLAN:  PULMONARY A: Respiratory insufficiency - due to inability to protect the airway in setting of possible GBS. Concern for aspiration vs  HCAP. P:   PRVC 8 cc/kg  Wean PEEP / FiO2 for sats > 90% See ID  Follow CXR  Mental status remains barrier for extubation  Daily WUA / SBT as tolerated   CARDIOVASCULAR A:  Sinus tach. VT Arrest - following intubation, resolved. Hx MI, HTN, HLD, CAD. P:  ICU monitoring  PRN lopressor  RENAL A:   Hypophosphatemia - s/p repletion. Hyponatremia - resolved  P:   Trend BMP / urinary output Replace electrolytes as indicated Avoid nephrotoxic agents, ensure adequate renal perfusion  GASTROINTESTINAL A:   Severe Protein Calorie Malnutrition / Weight Loss - reported 40lb weight loss in months prior to admit  Nutrition. Protein calorie malnutrition. GI prophylaxis. Diarrhea  P:   NPO / OGT  TF for nutrition  PPI for SUP    HEMATOLOGIC A:   Anemia. VTE prophylaxis. P:  Trend CBC  SCD's + Lovenox for DVT prophylaxis   INFECTIOUS A:   Concern for Aspiration vs HCAP. Possible UTI. P:   Empiric abx as above Trend PCT  Follow cultures, LP evaluation  ID consulted, appreciate input  Repeat glucose on CSF Assess CSF AFB  ENDOCRINE A:   Hx DM. P:   SSI  Continue levemir    NEUROLOGIC A:   Acute Encephalopathy - metabolic, +/- anoxic, Neuro concerned for possible meningitis  Generalized weakness - question if his initial presentation was related to malnutrition / deconditioning in the setting of cancer  Sedation due to mechanical ventilation. Concern for GBS - started on IVIG x 5 days but did not have improvement. Seizures - EEG 2/25 with several episodes of left temporal epileptiform activity, 1 sec burst suppression pattern. P:   Sedation: PRN versed, fentanyl  RASS goal: 0  Minimize sedation as able  Neurology following, appreciate input  Follow serial neuro exam Continue keppra IVIG per Neuro   UROLOGY A: S/p TURP - had BCG therapy post surgery  P: Will need Urology follow up at discharge    FAMILY  - Updates: Wife and daughter updated at  bedside 2/27 am.     - Inter-disciplinary family meet or Palliative Care meeting due by:  December 18, 2017.  CC Time:  7 minutes   Noe Gens, NP-C [facility] Pgr: [phone] or if no answer [phone] 11/23/2017, 10:08 AM

## 2017-11-23 NOTE — Consult Note (Addendum)
[name] for Infectious Disease    Date of Admission:  11/11/2017     Total days of antibiotics 5   Vancomycin   Cefepime   Acyclovir   Ampicillin             Reason for Consult: meningitis, ?M.Bovis     Referring Provider: PCCM Primary Care Provider: Caren Macadam, MD   Assessment: 62 y.o. M with 3 month weight loss, functional decline, weakness, altered mental status recently transferred to [facility] from [facility]. He is currently being treated empirically for bacterial meningitis and HCAP. Breathing spontaneously on ventilator presently and opens eyes to voice. Appears he is trying to move his RUE when asked; no lower extremity response at all. LP without pleocytosis (WBC 6 with some lymphs present on diff) however extremely low CSF glucose (< 20) and elevated protein (~350). 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   Plan: 1. Check serum AFB culture/smear to assess for disseminated NTM infection.   2. Check non-tuberculosis mycobacterium (NTM) DNA PCR for CSF 3. No need for negative pressure room.  4. Will stop acyclovir as this is not typical of HSV infection.  5. For aspiration pneumonia - will stop ampicillin, vancomycin and cefepime - change to ampicillin sulbactam for aspiration coverage as this is likely contributing to his fevers.  6. Would change tylenol to PRN to trend natural fever curve.   Addendum - cytology showing evidence of cancerous cells.  I have seen and examined the patient with our nurse practitioner. Physical exam is concerning for nuchal rigidity and decrease mentation. Agree with the plan listed above. Will provide further recs tomorrow.  Elzie Rings [place] for Infectious Diseases [phone]   Active Problems:   Hyponatremia   Protein-calorie malnutrition, severe   Pressure injury of skin  Hypophosphatemia   Endotracheally intubated   Normocytic anemia   Acute respiratory failure with hypoxia (HCC)   . acetaminophen  650 mg Per Tube Q8H  . chlorhexidine gluconate (MEDLINE KIT)  15 mL Mouth Rinse BID  . insulin aspart  0-20 Units Subcutaneous Q4H  . insulin detemir  18 Units Subcutaneous BID  . mouth rinse  15 mL Mouth Rinse 10 times per day  . methylPREDNISolone (SOLU-MEDROL) injection  40 mg Intravenous Q12H  . multivitamin  15 mL Per Tube Daily  . pantoprazole sodium  40 mg Per Tube Daily    HPI: Raymond [name] is a 62 y.o. male with past medical history of CAD, T2DM, HTN, HLD, MI, arthritis, anemia and s/p TURP 04/2017 due to bladder tumor. S/P BCG immunotherapy for bladder cancer. He was admitted to [facility] since 2/14 after follow up with PCP revealed 40 lb weight loss and severe weakness/functional decline.   Initial presentation felt to be due at least in part to hyponatremia (noted on admit), but symptoms persisted after sodium was corrected. He had MRI at [facility] that was read as negative. He was seen by neurology in consultation who felt that he could have guillain-barre syndrome and was started on IVIG. Developed fevers on 2/23 to 102 - concerned for aspiration as he had been intermittently on BiPAP - started on empiric Vancomycin/zosyn.ultimately was intubated on 2/25 due to concern for further decline and risk of aspiration. He suffered a brief VT arrest post intubation with successful ROSC. The patient was transferred to [facility]  2/25 for further evaluation and management.   MRI of the head was obtained 2/26 > persistent abnormal signal layering in both occipital horns (debris?); superimposed abnormal enhancement of bilateral 7/8th cranial nerves, the basal turn of the right cochlea and possibly the cisternal segment of the 5th nerve. Appears to be abnormal leptomeningeal enhancement as opposed to CN masses. Not wide-spread intracranial leptomeningeal  abnormality there is diffuse abnormal leptomeningial enhancement of the lower thoracic spinal cord/conus/cauda equina. LP was recommended. This constellation of findings could reflect leptomeningeal infection, noninfectious inflammation, or less likely carcinomatosis.  2/27  LP - glucose <20, RBC 7, WBC 6  He was started on empiric bacterial meningitis therapy with acyclovir ampicillin vanc and ceftriaxone. Fevers are still occurring despite antipyretics however < 101 now. All blood culture data has been no growth. Urine with sensitive pseudomonas but no previous urinary complaints documented. 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   Review of Systems: Review of Systems  Unable to perform ROS: Intubated    Past Medical History:  Diagnosis Date  . Anemia   . Arthritis    Hands and left knee  . Coronary artery disease   . Diabetes mellitus without complication ([place])   . High cholesterol   . Hypertension   . MI (myocardial infarction) ([place]) 2009    Social History   Tobacco Use  . Smoking status: Never Smoker  . Smokeless tobacco: Never Used  Substance Use Topics  . Alcohol use: No  . Drug use: No    Family History  Problem Relation Age of Onset  . Diabetes Mother   . Heart attack Mother   . Cancer Father        lung  . Stroke Paternal Grandfather   . Colon cancer Paternal Uncle    No Known Allergies  OBJECTIVE: Blood pressure (!) 144/82, pulse 73, temperature 99.1 F (37.3 C), temperature source Axillary, resp. rate 16, height '6\' 1"'  (1.854 m), weight 167 lb 12.3 oz (76.1 kg), SpO2 100 %.  Physical Exam  Constitutional: He is oriented to person, place, and time. He appears malnourished. He appears unhealthy.  Eyes closed on ventilator lying in bed.   HENT:  Mouth/Throat: Oropharynx is clear and moist. No oral lesions. Normal dentition. No dental caries.  ETT in place   Eyes: No scleral icterus.  Neck: Neck rigidity  present. Decreased range of motion present.  Cardiovascular: Normal rate, regular rhythm and normal heart sounds.  Pulmonary/Chest: Effort normal and breath sounds normal.  Coarse breath sounds   Abdominal: Soft. He exhibits no distension. There is no tenderness.  Musculoskeletal: He exhibits no edema.  Flickering of upper extremities. No spontaneous LE movement   Lymphadenopathy:    He has no cervical adenopathy.  Neurological: He is alert and oriented to person, place, and time.  Skin: Skin is warm and dry. No rash noted.  Psychiatric: Mood and affect normal.  Vitals reviewed.  Lab Results Lab Results  Component Value Date   WBC 13.1 (H) 11/23/2017   HGB 8.1 (L) 11/23/2017   HCT 25.8 (L) 11/23/2017   MCV 92.8 11/23/2017   PLT 219 11/23/2017    Lab Results  Component Value Date   CREATININE 0.85 11/23/2017   BUN 51 (H) 11/23/2017   NA 141 11/23/2017   K 4.2 11/23/2017   CL 107 11/23/2017   CO2 25 11/23/2017    Lab Results  Component Value Date   ALT 60  11/22/2017   AST 92 (H) 11/22/2017   ALKPHOS 39 11/22/2017   BILITOT 0.2 (L) 11/22/2017     Microbiology: Urine 2/24 >> pseudomonas (sensitive) BCx2 2/24 >> NG LP 2/26 >> NG on cx - WBC 6, Gluc Crypto Ag 2/26 >> negative CSF AFB 2/27 >>  Janene Madeira, MSN, NP-C [facility] for Infectious [place] Cell: [phone] Pager: [phone]  11/23/2017 2:07 PM

## 2017-11-23 NOTE — Progress Notes (Signed)
Janene Madeira, NP from Infectious Disease wanted to hold scheduledTylenol if patient is afebrile so she could see the patient's natural fever pattern.

## 2017-11-24 ENCOUNTER — Inpatient Hospital Stay (HOSPITAL_COMMUNITY): Payer: Federal, State, Local not specified - PPO

## 2017-11-24 DIAGNOSIS — N319 Neuromuscular dysfunction of bladder, unspecified: Secondary | ICD-10-CM

## 2017-11-24 DIAGNOSIS — R4182 Altered mental status, unspecified: Secondary | ICD-10-CM

## 2017-11-24 DIAGNOSIS — R209 Unspecified disturbances of skin sensation: Secondary | ICD-10-CM

## 2017-11-24 DIAGNOSIS — C800 Disseminated malignant neoplasm, unspecified: Secondary | ICD-10-CM

## 2017-11-24 DIAGNOSIS — Z8551 Personal history of malignant neoplasm of bladder: Secondary | ICD-10-CM

## 2017-11-24 DIAGNOSIS — J9690 Respiratory failure, unspecified, unspecified whether with hypoxia or hypercapnia: Secondary | ICD-10-CM

## 2017-11-24 DIAGNOSIS — N390 Urinary tract infection, site not specified: Secondary | ICD-10-CM

## 2017-11-24 DIAGNOSIS — J690 Pneumonitis due to inhalation of food and vomit: Secondary | ICD-10-CM

## 2017-11-24 DIAGNOSIS — C801 Malignant (primary) neoplasm, unspecified: Secondary | ICD-10-CM

## 2017-11-24 DIAGNOSIS — R634 Abnormal weight loss: Secondary | ICD-10-CM

## 2017-11-24 DIAGNOSIS — C7949 Secondary malignant neoplasm of other parts of nervous system: Secondary | ICD-10-CM

## 2017-11-24 DIAGNOSIS — J189 Pneumonia, unspecified organism: Secondary | ICD-10-CM

## 2017-11-24 DIAGNOSIS — M6281 Muscle weakness (generalized): Secondary | ICD-10-CM

## 2017-11-24 DIAGNOSIS — R630 Anorexia: Secondary | ICD-10-CM

## 2017-11-24 LAB — CBC
HCT: 27.9 % — ABNORMAL LOW (ref 39.0–52.0)
Hemoglobin: 8.6 g/dL — ABNORMAL LOW (ref 13.0–17.0)
MCH: 28.4 pg (ref 26.0–34.0)
MCHC: 30.8 g/dL (ref 30.0–36.0)
MCV: 92.1 fL (ref 78.0–100.0)
PLATELETS: 236 10*3/uL (ref 150–400)
RBC: 3.03 MIL/uL — ABNORMAL LOW (ref 4.22–5.81)
RDW: 14.3 % (ref 11.5–15.5)
WBC: 16 10*3/uL — AB (ref 4.0–10.5)

## 2017-11-24 LAB — URINE CULTURE: Culture: 80000 — AB

## 2017-11-24 LAB — PHOSPHORUS
Phosphorus: 3 mg/dL (ref 2.5–4.6)
Phosphorus: 3 mg/dL (ref 2.5–4.6)

## 2017-11-24 LAB — BASIC METABOLIC PANEL
ANION GAP: 8 (ref 5–15)
BUN: 39 mg/dL — ABNORMAL HIGH (ref 6–20)
CALCIUM: 8.8 mg/dL — AB (ref 8.9–10.3)
CO2: 26 mmol/L (ref 22–32)
Chloride: 109 mmol/L (ref 101–111)
Creatinine, Ser: 0.74 mg/dL (ref 0.61–1.24)
GLUCOSE: 290 mg/dL — AB (ref 65–99)
Potassium: 4.8 mmol/L (ref 3.5–5.1)
Sodium: 143 mmol/L (ref 135–145)

## 2017-11-24 LAB — GLUCOSE, CSF

## 2017-11-24 LAB — CULTURE, RESPIRATORY W GRAM STAIN

## 2017-11-24 LAB — GLUCOSE, CAPILLARY
GLUCOSE-CAPILLARY: 263 mg/dL — AB (ref 65–99)
GLUCOSE-CAPILLARY: 240 mg/dL — AB (ref 65–99)
GLUCOSE-CAPILLARY: 216 mg/dL — AB (ref 65–99)
GLUCOSE-CAPILLARY: 299 mg/dL — AB (ref 65–99)
Glucose-Capillary: 275 mg/dL — ABNORMAL HIGH (ref 65–99)
Glucose-Capillary: 269 mg/dL — ABNORMAL HIGH (ref 65–99)

## 2017-11-24 LAB — MAGNESIUM
MAGNESIUM: 2.1 mg/dL (ref 1.7–2.4)
Magnesium: 2.1 mg/dL (ref 1.7–2.4)

## 2017-11-24 LAB — CULTURE, RESPIRATORY

## 2017-11-24 LAB — PROCALCITONIN: Procalcitonin: 0.18 ng/mL

## 2017-11-24 MED ORDER — IOPAMIDOL (ISOVUE-300) INJECTION 61%
INTRAVENOUS | Status: AC
  Administered 2017-11-24: 75 mL
  Filled 2017-11-24: qty 75

## 2017-11-24 MED ORDER — ACETAMINOPHEN 160 MG/5ML PO SOLN
650.0000 mg | Freq: Three times a day (TID) | ORAL | Status: DC
  Administered 2017-11-24 – 2017-11-27 (×8): 650 mg
  Filled 2017-11-24 (×10): qty 20.3

## 2017-11-24 NOTE — Progress Notes (Signed)
PULMONARY / CRITICAL CARE MEDICINE   Name: Raymond [name] MRN: [id number] DOB: Jun 22, 1956    ADMISSION DATE:  10/28/2017 CONSULTATION DATE:  11/21/17  REFERRING MD:  Dr. Dyann Kief  CHIEF COMPLAINT:  Respiratory failure  BRIEF SUMMARY:  62 year old male with past medical history of CAD, DMT2, HTN, HLD, MI, arthritis, anemia, and TURP (04/2017) secondary to bladder tumor admitted to [facility] on 2/14 with generalized weakness, pelvic pain, and numbness for 3 months.  He was seen in office on 2/14 by primary MD with reported 3 month hx of weight loss (40lbs) and functional decline.  He was referred for direct admit at [facility].    Initial presentation felt to be due to hyponatremia which was noted on admit, but symptoms persisted after sodium was corrected.  He had MRI that was negative. 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  Course complicated by sepsis from aspiration vs HCAP and UTI.  He intermittently required BiPAP.  On 2/25, he was on BiPAP and due to concern for further decline and increased risk of aspiration, he was subsequently intubated.  He suffered a brief VT arrest post intubation.  The patient was transferred to [facility] 2/25 for further evaluation and management. Cerebral spinal fluid with cancerous cells.  SUBJECTIVE: Sedated on with mechanical ventilatory support in place.  In route to CT scan.  VITAL SIGNS: BP (!) 150/81   Pulse 95   Temp 98.9 F (37.2 C) (Axillary)   Resp (!) 22   Ht 6\' 1"  (1.854 m)   Wt 76 kg (167 lb 8.8 oz)   SpO2 97%   BMI 22.11 kg/m   HEMODYNAMICS:    VENTILATOR SETTINGS: Vent Mode: PRVC FiO2 (%):  [35 %] 35 % Set Rate:  [14 bmp] 14 bmp Vt Set:  [550 mL] 550 mL PEEP:  [5 cmH20] 5 cmH20 Pressure Support:  [10 cmH20] 10 cmH20 Plateau Pressure:  [15 cmH20-16 cmH20] 15 cmH20  INTAKE / OUTPUT: I/O last 3 completed shifts: In: 7989 [I.V.:1073.4; NG/GT:2220.6; IV  Piggyback:2070] Out: 3685 [Urine:3685]  PHYSICAL EXAMINATION: General:cachectic male full mechanical ventilatory support HEENT: Endotracheal tube to ventilator orogastric tube Neuro: No response to verbal stimulus.  Tremors movements of upper extremities.  Disconjugate gaze.  Positive gag reflex CV: s1s2 rrr, no m/r/g systolic blood pressure 211 with a diastolic of 90  PULM: Currently on full mechanical ventilatory support, copious tracheal secretions coarse rhonchi bilaterally HE:RDEY, non-tender, bsx4 active  Extremities: warm/dry, negative edema  Skin: no rashes or lesions  LABS:  BMET Recent Labs  Lab 11/22/17 1047 11/23/17 0511 11/24/17 0429  NA 145 141 143  K 4.2 4.2 4.8  CL 110 107 109  CO2 27 25 26   BUN 55* 51* 39*  CREATININE 0.90 0.85 0.74  GLUCOSE 131* 294* 290*    Electrolytes Recent Labs  Lab 11/22/17 1047 11/23/17 0511 11/23/17 1933 11/24/17 0429  CALCIUM 9.3 8.6*  --  8.8*  MG  --  2.3 2.2 2.1  PHOS  --  3.5 3.5 3.0    CBC Recent Labs  Lab 11/22/17 1047 11/23/17 0511 11/24/17 0429  WBC 11.8* 13.1* 16.0*  HGB 10.0* 8.1* 8.6*  HCT 31.5* 25.8* 27.9*  PLT 314 219 236    Coag's No results for input(s): APTT, INR in the last 168 hours.  Sepsis Markers Recent Labs  Lab 11/20/17 0155  PROCALCITON 1.67    ABG Recent  Labs  Lab 11/21/17 0433 11/21/17 1725 11/22/17 0116  PHART 7.511* 7.480* 7.485*  PCO2ART 34.6 38.2 38.7  PO2ART 98.3 114* 114*    Liver Enzymes Recent Labs  Lab 11/21/17 0440 11/22/17 1047  AST 83* 92*  ALT 42 60  ALKPHOS 36* 39  BILITOT 0.4 0.2*  ALBUMIN 2.0* 1.8*    Cardiac Enzymes Recent Labs  Lab 11/20/17 1414 11/20/17 2128 11/21/17 0211  TROPONINI 0.63* 0.96* 0.74*    Glucose Recent Labs  Lab 11/23/17 1155 11/23/17 1539 11/23/17 1920 11/23/17 2330 11/24/17 0344 11/24/17 0733  GLUCAP 224* 276* 281* 247* 275* 263*    Imaging Dg Chest Port 1 View  Result Date: 11/24/2017 CLINICAL DATA:   Respiratory failure.  Hypoxia. EXAM: PORTABLE CHEST 1 VIEW COMPARISON:  11/23/2017.  11/22/2017.  10/22/2015. FINDINGS: Endotracheal tube and NG tube in stable position. Mediastinum and hilar structures are normal. Mild left base atelectasis/infiltrate again noted. No interim change. No prominent pleural effusion or pneumothorax. Old right clavicular fracture. IMPRESSION: 1.  Lines and tubes in stable position. 2. Persistent left base atelectasis/infiltrate. Continued follow-up exam suggested to demonstrate clearing in order to exclude a underlying mass lesion. Electronically Signed   By: Marcello Moores  Register   On: 11/24/2017 07:36    STUDIES:  MRI Cervical / Lumbar / Thoracic Spine >> no acute / traumatic finding, central disc herniation T7-8, T8-9 without neural compression, central to right sided disc herniation at T9-10 without likely neural compression, T10-11 central disc herniation with upward migration behind T10 with some indentation of the left side of the cord, ample subarachnoid space present dorsal to the cord, compression unlikely EEG 2/25 > several episodes of left temporal epileptiform activity, 1 sec burst suppression pattern. Echo 2/25 > EF 65-70%. EEG 2/26 >> abnormal due to significant diffuse slowing of the waking background MRI Brain 2/26 >> persistent abnormal signal layering in both occipital horns favored to be debris rather than blood, superimposed abnormal enhancement of the bilateral 7/8th cranial nerves, the basal turn of the right cochlea, & possibly also the cisternal segment of the R 5th nerve, diffuse abnormal leptomeningeal enhancement of the lower thoracic spinal cord, conus and cauda equina  11/24/2017 CT of the head>> CULTURES: Urine 2/24 >> pseudomonas >> pan sensitive   BCx2 2/24 >>  LP 2/26 >>  Crypto Ag 2/26 >> negative CSF AFB 2/27 >>  ANTIBIOTICS: Zosyn 2/24 >> 2/25 Vanc 2/24 >> 2/27 Cefepime 2/26 >> 2/27 Ampicillin 2/26 >> 2/27 Acyclovir 2/26 >>  2/27 Unasyn 11/23/2017>>  SIGNIFICANT EVENTS: 2/14  Admit. 2/18  Neuro consult. 2/24  Evening episode of possible seizure, EEG with left temporal epileptiform activity  2/24  Intubated. 2/25  Episode of VT > brief code.  Transfer to [facility].  2/27  LP - glucose <20, RBC 7, other cells, WBC 6, questionable carcinomatous meningitis 228 CT of the head>>   LINES/TUBES: ETT 2/24 >>   DISCUSSION: 62 y.o. M admitted to [facility] 2/14 with generalized weakness.  Initially felt to be due to hyponatremia but no change after Na corrected.  Seen by neurology who felt that this could represent GBS.  He was started on IVIG x 5 days but did not have significant improvement.  On 2/24, he required intubation.  He was then transferred to [facility] for further management.  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  ASSESSMENT / PLAN:  PULMONARY A: Respiratory insufficiency - due to inability to protect the airway in setting of possible GBS. Concern for  aspiration vs HCAP. P:   PRVC 8 cc/kg  Wean PEEP / FiO2 for sats > 90% See ID  Follow CXR  Mental status remains barrier for extubation  Daily WUA / SBT as tolerated   CARDIOVASCULAR A:  Sinus tach. VT Arrest - following intubation, resolved. Hx MI, HTN, HLD, CAD. P:  ICU monitoring  PRN lopressor  RENAL Recent Labs  Lab 11/22/17 1047 11/23/17 0511 11/24/17 0429  NA 145 141 143   Lab Results  Component Value Date   CREATININE 0.74 11/24/2017   CREATININE 0.85 11/23/2017   CREATININE 0.90 11/22/2017    A:   Hypophosphatemia - s/p repletion. Hyponatremia - resolved  P:   Trend BMP / urinary output Replace electrolytes as indicated Avoid nephrotoxic agents, ensure adequate renal perfusion  GASTROINTESTINAL A:   Severe Protein Calorie Malnutrition / Weight Loss - reported 40lb weight loss in months prior to admit  Nutrition. Protein calorie malnutrition. GI prophylaxis. Diarrhea  P:   NPO / OGT  TF for  nutrition  PPI for SUP    HEMATOLOGIC Recent Labs    11/23/17 0511 11/24/17 0429  HGB 8.1* 8.6*    A:   Anemia. VTE prophylaxis. P:  Trend CBC  SCD's + Lovenox for DVT prophylaxis   INFECTIOUS A:   Concern for Aspiration vs HCAP. Possible UTI. ID consult 2/27 P:   Abx per ID Trend PCT ordered new 2/28 Follow cultures, LP evaluation  ID consulted, appreciate input  Assess CSF AFB  ENDOCRINE CBG (last 3)  Recent Labs    11/23/17 2330 11/24/17 0344 11/24/17 0733  GLUCAP 247* 275* 263*    A:   Hx DM. P:   SSI  Continue levemir    NEUROLOGIC A:   Acute Encephalopathy - metabolic, +/- anoxic, Neuro concerned for possible meningitis ,  note LP reveals shows malignant cells, presentation consistent with carcinomatous meningitis. Will need hemonc consult   Generalized weakness - question if his initial presentation was related to malnutrition / deconditioning in the setting of cancer  Sedation due to mechanical ventilation. Concern for GBS - started on IVIG x 5 days but did not have improvement. Seizures - EEG 2/25 with several episodes of left temporal epileptiform activity, 1 sec burst suppression pattern. P:   Sedation: PRN versed, fentanyl  RASS goal: 0  Minimize sedation as able  Neurology following, appreciate input  Follow serial neuro exam Continue keppra IVIG per Neuro   UROLOGY A: S/p TURP - had BCG therapy post surgery  P: Will need Urology follow up at discharge     FAMILY  - Updates: Wife and daughter updated at bedside 2/27 am.     - Inter-disciplinary family meet or Palliative Care meeting due by:  12/05/2017.  CC Time:  35 minutes   Richardson Landry Minor ACNP Maryanna Shape PCCM Pager [phone] till 1 pm If no answer page [phone] 11/24/2017, 8:52 AM

## 2017-11-24 NOTE — Progress Notes (Signed)
Inpatient Diabetes Program Recommendations  AACE/ADA: New Consensus Statement on Inpatient Glycemic Control (2015)  Target Ranges:  Prepandial:   less than 140 mg/dL      Peak postprandial:   less than 180 mg/dL (1-2 hours)      Critically ill patients:  140 - 180 mg/dL   Lab Results  Component Value Date   GLUCAP 263 (H) 11/24/2017   HGBA1C 9.1 (H) 11/12/2017   Diabetes history: Type 2 DM Outpatient Diabetes medications:  Humalog 75/25- 35 units in the AM and 30 units in the evening Metformin 1000 mg bid Victoza 1.8 mg daily Current orders for Inpatient glycemic control:  Levemir 18 units bid, Novolog resistant q 4 hours  Inpatient Diabetes Program Recommendations:    Glucose trends consistently in the 200's. Patient is on tube feeds, also receiving steroids. Consider adding Novolog tube feed coverage 3 units q 4 hours.    Thanks,  Tama Headings RN, MSN, BC-ADM, [facility] Inpatient Diabetes Coordinator Team Pager [phone] (8a-5p)

## 2017-11-24 NOTE — Progress Notes (Signed)
Pt transported to and from CT.

## 2017-11-24 NOTE — Progress Notes (Signed)
Pt is unable to follow commands and unable to perform NIF or VC.

## 2017-11-24 NOTE — Progress Notes (Signed)
[facility] for Infectious Disease  Date of Admission:  11/06/2017     Total days of antibiotics 6         Patient ID: Raymond [name] is a 62 y.o. M admitted with AMS, weight loss, severe functional decline (paralysis of LEs). Concern for mycobacterium bovis meningitis vs carcinomatosis as well as aspiration pneumonia.   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 acetaminophen  650 mg Per Tube Q8H  . chlorhexidine gluconate (MEDLINE KIT)  15 mL Mouth Rinse BID  . insulin aspart  0-20 Units Subcutaneous Q4H  . insulin detemir  18 Units Subcutaneous BID  . mouth rinse  15 mL Mouth Rinse 10 times per day  . methylPREDNISolone (SOLU-MEDROL) injection  40 mg Intravenous Q12H  . multivitamin  15 mL Per Tube Daily  . pantoprazole sodium  40 mg Per Tube Daily    SUBJECTIVE: Remains intubated and exam clinically unchanged. Fever curve improving with one temp of 100 last PM. CSF analysis noted with malignant cells. Ventilator settings remain minimal.   No Known Allergies  OBJECTIVE: Vitals:   11/24/17 1217 11/24/17 1300 11/24/17 1400 11/24/17 1500  BP:  (!) 141/73 (!) 152/91 (!) 150/81  Pulse: 95 97 96 (!) 101  Resp: 20 20 (!) 25 (!) 26  Temp:      TempSrc:      SpO2: 99% 99% 98% 99%  Weight:      Height:       Body mass index is 22.11 kg/m.  Physical Exam  Constitutional: He is oriented to person, place, and time. He appears malnourished. He appears unhealthy.  HENT:  Mouth/Throat: No oral lesions. Normal dentition. No dental caries.  ETT in place   Eyes: No scleral icterus.  Neck: Neck rigidity present. Decreased range of motion present.  Favors the right side   Cardiovascular: Normal rate, regular  rhythm and normal heart sounds.  Pulmonary/Chest: Effort normal and breath sounds normal. No respiratory distress. He has no rales.  Abdominal: Soft. He exhibits no distension. There is no tenderness.  Musculoskeletal:  No spontaneous movement of LEs. Flickering of upper extrems  Lymphadenopathy:    He has no cervical adenopathy.  Neurological: He is alert and oriented to person, place, and time.  Opens eyes to voice briefly. Grimaces to pain. Not following commands today.   Skin: Skin is warm and dry. No rash noted.  Psychiatric: Mood and affect normal.  Nursing note and vitals reviewed.   Lab Results Lab Results  Component Value Date   WBC 16.0 (H) 11/24/2017   HGB 8.6 (L) 11/24/2017   HCT 27.9 (L) 11/24/2017   MCV 92.1 11/24/2017   PLT 236 11/24/2017    Lab Results  Component Value Date   CREATININE 0.74 11/24/2017   BUN 39 (H) 11/24/2017   NA 143 11/24/2017   K 4.8 11/24/2017   CL 109 11/24/2017   CO2 26 11/24/2017    Lab Results  Component Value Date   ALT 60 11/22/2017   AST 92 (H) 11/22/2017   ALKPHOS 39 11/22/2017   BILITOT 0.2 (L) 11/22/2017     Microbiology: Urine 2/24 >>pseudomonas (sensitive) 80k count previously 100k BCx2 2/24 >>NG LP 2/26 >>  NG on cx - WBC 6, Gluc Crypto Ag 2/26 >> negative CSF AFB 2/27 >>  CSF >> malignant cells identified   ASSESSMENT: 62 y.o. M with weight loss (nearly 50lbs since January), severe and rapid functional decline, weakness, altered mental status recently transferred to [facility] from [facility]. 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 AFB studies still pending, however would presume to be negative. Presently being treated for aspiration pneumonia with ampicillin-sulbactam.   PLAN: 1. Would continue to treat for aspiration pneumonia with ampicillin-sulbactam. He has had 4 days total of therapy with a  24h lapse in coverage - would treat for 6 more days.   2. If he continues to have increased WBC count or fevers without worsened CXR/pulm status could consider treating pseudomonas isolate in urine and change to IV Zosyn for dual treatment vs ceftazidime monotherapy if he completes recommended therapy with amp-sulb.   Will sign off for now. Happy to have a call back about this patient should his condition change or anything from AFB studies result unexpectedly.   Janene Madeira, MSN, NP-C [facility] for Infectious [place] Cell: [phone] Pager: [phone]  11/24/2017  3:29 PM

## 2017-11-24 NOTE — Consult Note (Signed)
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  The family report he had an associated 20-30 pound weight loss and anorexia.  He was unable to ambulate secondary to leg weakness and he had developed incontinence of stool and urinary retention.  A Foley catheter was placed by his urologist prior to the emergency room visit.  He was noted to have hyponatremia.  He was admitted for further evaluation.  He was seen in consultation by neurology.  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  He was transferred to the intensive care unit and was started on IVIG therapy.  There was also concern of possible statin myopathy.  He developed respiratory failure and required intubation on 11/20/2017.  He was treated for aspiration pneumonia.  He had an episode of V-tach.  He had progressive altered mental status.  An EEG on 11/21/2017 feels several episodes of left temporal epileptiform activity.  Started on Keppra.  He was transferred to [facility].  A repeat MRI of the brain on 11/22/2017 revealed persistent abnormal signal layering in the occipital horns felt to be secondary to debris.  Abnormal enhancement of the bilateral 7th/8th cranial nerves, the cisternal segment of the right 5th nerve.  Diffuse abnormal leptomeningeal enhancement of the lower thoracic cord, conus, and cauda equina was noted on the MRI 11/11/2017.  A lumbar puncture 11/22/2017 revealed an opening pressure of 15 cm.  11 cc of spinal fluid were removed. The cell count revealed 11 red cells, 6 white cells, a protein of 370, and glucose less than 20. The cytology ([id number]) revealed malignant cells consistent with  carcinoma.  He remains intubated and minimally responsive.  Multiple family members are at the bedside.   Past Medical History:  Diagnosis Date  . Anemia   . Arthritis    Hands and left knee  . Coronary artery disease   . Diabetes mellitus without complication ([place])   . High cholesterol   . Hypertension   . MI (myocardial infarction) ([place]) 2009  : .  History of "bladder cancer ", high-grade urothelial carcinoma with foci suspicious for superficial invasion on transurethral resection July 2018                                                                            2018  Past Surgical History:  Procedure Laterality Date  . APPENDECTOMY    . CARDIAC CATHETERIZATION    . COLONOSCOPY    . COLONOSCOPY N/A 04/01/2017   Procedure: COLONOSCOPY;  Surgeon: Daneil Dolin, MD;  Location: [facility] ENDO SUITE;  Service: Endoscopy;  Laterality: N/A;  9:45 AM  . CORONARY STENT PLACEMENT  2009   x3  . CYSTOSCOPY N/A 05/16/2017   Procedure: CYSTOSCOPY;  Surgeon: Cleon Gustin, MD;  Location: [facility] ORS;  Service: Urology;  Laterality: N/A;  . CYSTOSCOPY W/ URETERAL STENT PLACEMENT Bilateral 04/11/2017   Procedure: CYSTOSCOPY WITH RETROGRADE PYELOGRAM;  Surgeon: Nicolette Bang  L, MD;  Location: [facility] ORS;  Service: Urology;  Laterality: Bilateral;  . FRACTURE SURGERY Right    Steel Rod in femur  . INGUINAL HERNIA REPAIR Left 08/15/2017   Procedure: HERNIA REPAIR INGUINAL ADULT WITH MESH;  Surgeon: Aviva Signs, MD;  Location: [facility] ORS;  Service: General;  Laterality: Left;  . KNEE ARTHROSCOPY Left   . TONSILLECTOMY    . TRANSURETHRAL RESECTION OF BLADDER TUMOR N/A 04/11/2017   Procedure: TRANSURETHRAL RESECTION OF BLADDER TUMOR (TURBT);  Surgeon: Cleon Gustin, MD;  Location: [facility] ORS;  Service: Urology;  Laterality: N/A;  1 [id number]  . TRANSURETHRAL RESECTION OF BLADDER TUMOR N/A 05/16/2017   Procedure: TRANSURETHRAL RESECTION OF BLADDER TUMOR (TURBT);  Surgeon:  Cleon Gustin, MD;  Location: [facility] ORS;  Service: Urology;  Laterality: N/A;  :   Current Facility-Administered Medications:  .  acetaminophen (TYLENOL) tablet 650 mg, 650 mg, Per Tube, Q8H, Reubin Milan, MD, 650 mg at 11/24/17 1536 .  albuterol (PROVENTIL) (2.5 MG/3ML) 0.083% nebulizer solution 2.5 mg, 2.5 mg, Nebulization, Q2H PRN, Sinda Du, MD .  Ampicillin-Sulbactam (UNASYN) 3 g in sodium chloride 0.9 % 100 mL IVPB, 3 g, Intravenous, Q6H, Dixon, Melton Krebs, NP, Stopped at 11/24/17 1228 .  bisacodyl (DULCOLAX) suppository 10 mg, 10 mg, Rectal, Daily PRN, Barton Dubois, MD .  chlorhexidine gluconate (MEDLINE KIT) (PERIDEX) 0.12 % solution 15 mL, 15 mL, Mouth Rinse, BID, Barton Dubois, MD, 15 mL at 11/24/17 0816 .  feeding supplement (VITAL AF 1.2 CAL) liquid 1,000 mL, 1,000 mL, Per Tube, Continuous, Etheleen Nicks, MD, Last Rate: 75 mL/hr at 11/24/17 1000, 1,000 mL at 11/24/17 1000 .  fentaNYL (SUBLIMAZE) injection 100 mcg, 100 mcg, Intravenous, Q2H PRN, Sinda Du, MD, 100 mcg at 11/21/17 0256 .  hydrALAZINE (APRESOLINE) injection 10 mg, 10 mg, Intravenous, Q4H PRN, Opyd, Ilene Qua, MD, 10 mg at 11/20/17 0441 .  insulin aspart (novoLOG) injection 0-20 Units, 0-20 Units, Subcutaneous, Q4H, Reubin Milan, MD, 7 Units at 11/24/17 1535 .  insulin detemir (LEVEMIR) injection 18 Units, 18 Units, Subcutaneous, BID, Barton Dubois, MD, 18 Units at 11/24/17 1158 .  levETIRAcetam (KEPPRA) 500 mg in sodium chloride 0.9 % 100 mL IVPB, 500 mg, Intravenous, Q12H, Rigoberto Noel, MD, Stopped at 11/24/17 0630 .  MEDLINE mouth rinse, 15 mL, Mouth Rinse, 10 times per day, Barton Dubois, MD, 15 mL at 11/24/17 1544 .  methylPREDNISolone sodium succinate (SOLU-MEDROL) 40 mg/mL injection 40 mg, 40 mg, Intravenous, Q12H, Barton Dubois, MD, 40 mg at 11/24/17 1158 .  metoprolol tartrate (LOPRESSOR) injection 2.5-5 mg, 2.5-5 mg, Intravenous, Q3H PRN, Desai, Rahul P, PA-C, 5 mg at  11/22/17 0503 .  midazolam (VERSED) injection 2 mg, 2 mg, Intravenous, Q2H PRN, Sinda Du, MD, 2 mg at 11/21/17 0215 .  multivitamin liquid 15 mL, 15 mL, Per Tube, Daily, Etheleen Nicks, MD, 15 mL at 11/24/17 1158 .  ondansetron (ZOFRAN) tablet 4 mg, 4 mg, Oral, Q6H PRN **OR** ondansetron (ZOFRAN) injection 4 mg, 4 mg, Intravenous, Q6H PRN, Darrick Meigs, Marge Duncans, MD .  pantoprazole sodium (PROTONIX) 40 mg/20 mL oral suspension 40 mg, 40 mg, Per Tube, Daily, Rigoberto Noel, MD, 40 mg at 11/24/17 1158 .  phenol (CHLORASEPTIC) mouth spray 1 spray, 1 spray, Mouth/Throat, PRN, Barton Dubois, MD:  . acetaminophen  650 mg Per Tube Q8H  . chlorhexidine gluconate (MEDLINE KIT)  15 mL Mouth Rinse BID  . insulin aspart  0-20 Units Subcutaneous Q4H  .  insulin detemir  18 Units Subcutaneous BID  . mouth rinse  15 mL Mouth Rinse 10 times per day  . methylPREDNISolone (SOLU-MEDROL) injection  40 mg Intravenous Q12H  . multivitamin  15 mL Per Tube Daily  . pantoprazole sodium  40 mg Per Tube Daily  :  No Known Allergies:  FH: His father died of lung cancer and had a smoking history.  No other family history of cancer.  SOCIAL HISTORY:  Review of Systems: (Per family)  Positives include: Low back pain, urinary retention, fecal incontinence, leg weakness and numbness, anorexia/weight loss  A complete ROS was otherwise negative.   Physical Exam:  Blood pressure (!) 150/81, pulse 98, temperature (!) 100.4 F (38 C), temperature source Axillary, resp. rate (!) 26, height '6\' 1"'  (1.854 m), weight 167 lb 8.8 oz (76 kg), SpO2 99 %.  HEENT: ETT in place Lungs: Clear anteriorly Cardiac: Regular rate and rhythm Abdomen: No hepatosplenomegaly, no mass GU: Foley catheter in place, testes without mass Vascular: No leg edema Lymph nodes: No cervical, supraclavicular, axillary, or inguinal nodes Neurologic: Minimal eye opening with a sternal rub, otherwise not responding, not following commands Skin: No  rash Breast: Bilateral breast without mass  LABS:  Recent Labs    11/23/17 0511 11/24/17 0429  WBC 13.1* 16.0*  HGB 8.1* 8.6*  HCT 25.8* 27.9*  PLT 219 236    Recent Labs    11/23/17 0511 11/24/17 0429  NA 141 143  K 4.2 4.8  CL 107 109  CO2 25 26  GLUCOSE 294* 290*  BUN 51* 39*  CREATININE 0.85 0.74  CALCIUM 8.6* 8.8*      RADIOLOGY:  Ct Head Wo Contrast  Result Date: 11/24/2017 CLINICAL DATA:  Altered level of consciousness. The patient has less responsive than yesterday. EXAM: CT HEAD WITHOUT CONTRAST TECHNIQUE: Contiguous axial images were obtained from the base of the skull through the vertex without intravenous contrast. COMPARISON:  Brain MRI 11/22/2017.  Head CT scan 03/25/2009. FINDINGS: Brain: Compared to the priors head CT scan, the ventricles are dilated diffusely consistent with communicating hydrocephalus. Small volume of debris layering dependently in the occipital horns lateral ventricles is better visualized on the prior brain MRI. There is no midline shift, hemorrhage, acute infarction or mass. Vascular: No hyperdense vessel or unexpected calcification. Skull: Normal. Sinuses/Orbits: Negative. Other: None. IMPRESSION: Dilatation of the ventricular system compared to the 2010 CT scan consistent with communicating hydrocephalus. Debris layering within the lateral ventricles is better seen on comparison MRI. Electronically Signed   By: Inge Rise M.D.   On: 11/24/2017 09:07   Ct Chest W Contrast  Result Date: 11/24/2017 CLINICAL DATA:  Concern for Guillain-barre syndrome complicated by seizures and sepsis with acute respiratory failure. CPR performed outside hospital. History of bladder tumor. EXAM: CT CHEST WITH CONTRAST TECHNIQUE: Multidetector CT imaging of the chest was performed during intravenous contrast administration. CONTRAST:  68m ISOVUE-300 IOPAMIDOL (ISOVUE-300) INJECTION 61% COMPARISON:  CT abdomen 01/05/2017 FINDINGS: Cardiovascular: Heart  is normal size. Calcified plaque over the 3 vessel coronary arteries. Vascular structures are otherwise within normal. Mediastinum/Nodes: No mediastinal or hilar adenopathy. Lungs/Pleura: Tracheostomy tube in adequate position. Lungs are adequately inflated demonstrate mild bilateral patchy hazy airspace opacification worse in the left lower lobe likely with associated left basilar atelectasis. Findings are concerning for infection and less likely inflammatory process. Airways are otherwise within normal. Upper Abdomen: Nasogastric tube is present with tip over the gastric fundus. No acute findings. Musculoskeletal: Minimal degenerate change  of the spine. IMPRESSION: Patchy bilateral hazy airspace process worse over the left lower lobe with associated left basilar atelectasis. Findings are likely due to infection and less likely inflammatory process. Atherosclerotic coronary artery disease. Tubes and lines as described. Electronically Signed   By: Marin Olp M.D.   On: 11/24/2017 16:29   Mr Jodene Nam Head Wo Contrast  Result Date: 11/15/2017 CLINICAL DATA:  Multiple falls recently with back pain. EXAM: MRI HEAD WITHOUT CONTRAST MRA HEAD WITHOUT CONTRAST TECHNIQUE: Multiplanar, multiecho pulse sequences of the brain and surrounding structures were obtained without intravenous contrast. Angiographic images of the head were obtained using MRA technique without contrast. COMPARISON:  Head CT 03/25/2009 FINDINGS: MRI HEAD FINDINGS Brain: Diffusion imaging does not show any acute or subacute infarction. The brain shows generalized atrophy, progressive since 2010. The study does not show a pattern of advanced small vessel ischemic change, with only a few small foci within the hemispheric white matter. There is some layering material dependent within the ventricles, probably some old blood products related to the falls. The differential diagnosis would be layering debris secondary to meningitis. No evidence of mass lesion.  No extra-axial collection. Vascular: Major vessels at the base of the brain show flow. Skull and upper cervical spine: Negative Sinuses/Orbits: Clear/normal Other: None MRA HEAD FINDINGS Both internal carotid arteries are patent through the skull base and siphon regions. On the right, this vessel supplies only the middle cerebral artery. No MCA stenosis or occlusion. On the left, this vessel supplies the left middle cerebral artery and both anterior cerebral arteries. No stenosis, occlusion, aneurysm or high flow vascular malformation. Both vertebral arteries are widely patent to the basilar. No basilar stenosis. Posterior circulation branch vessels appear normal. IMPRESSION: Generalized brain atrophy, progressive since the CT scan of 2010. There are only minimal small vessel changes of the hemispheric white matter. Some layering material in the occipital horns of lateral ventricles, probably blood products related to recent falls described clinically. Differential diagnosis does include debris secondary to meningitis, but that seems less likely. Negative intracranial MR angiography of the large and medium size vessels. 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 Electronically Signed   By: Nelson Chimes M.D.   On: 11/15/2017 08:32   Mr Brain Wo Contrast  Result Date: 11/15/2017 CLINICAL DATA:  Multiple falls recently with back pain. EXAM: MRI HEAD WITHOUT CONTRAST MRA HEAD WITHOUT CONTRAST TECHNIQUE: Multiplanar, multiecho pulse sequences of the brain and surrounding structures were obtained without intravenous contrast. Angiographic images of the head were obtained using MRA technique without contrast. COMPARISON:  Head CT 03/25/2009 FINDINGS: MRI HEAD FINDINGS Brain: Diffusion imaging does not show any acute or subacute infarction. The brain shows generalized atrophy, progressive since 2010. The study does not show a pattern of advanced  small vessel ischemic change, with only a few small foci within the hemispheric white matter. There is some layering material dependent within the ventricles, probably some old blood products related to the falls. The differential diagnosis would be layering debris secondary to meningitis. No evidence of mass lesion. No extra-axial collection. Vascular: Major vessels at the base of the brain show flow. Skull and upper cervical spine: Negative Sinuses/Orbits: Clear/normal Other: None MRA HEAD FINDINGS Both internal carotid arteries are patent through the skull base and siphon regions. On the right, this vessel supplies only the middle cerebral artery. No MCA stenosis or occlusion. On the left, this vessel supplies the left middle cerebral artery  and both anterior cerebral arteries. No stenosis, occlusion, aneurysm or high flow vascular malformation. Both vertebral arteries are widely patent to the basilar. No basilar stenosis. Posterior circulation branch vessels appear normal. IMPRESSION: Generalized brain atrophy, progressive since the CT scan of 2010. There are only minimal small vessel changes of the hemispheric white matter. Some layering material in the occipital horns of lateral ventricles, probably blood products related to recent falls described clinically. Differential diagnosis does include debris secondary to meningitis, but that seems less likely. Negative intracranial MR angiography of the large and medium size vessels. 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 Electronically Signed   By: Nelson Chimes M.D.   On: 11/15/2017 08:32   Mr Jeri Cos CZ Contrast  Result Date: 11/22/2017 CLINICAL DATA:  61 year old male with encephalopathy, seizure activity, unresponsive. EXAM: MRI HEAD WITHOUT AND WITH CONTRAST TECHNIQUE: Multiplanar, multiecho pulse sequences of the brain and surrounding structures were obtained without and with  intravenous contrast. CONTRAST:  68m MULTIHANCE GADOBENATE DIMEGLUMINE 529 MG/ML IV SOLN [facility] 11/15/2017 FINDINGS: Brain: No restricted diffusion or evidence of acute infarction. However, there is persistent layering debris in the bilateral occipital horns which does demonstrate abnormal diffusion (series 4, image 20) but no T2* signal loss. Associated occipital horn periventricular T2 and FLAIR hyperintensity also appears mildly progressed since 11/15/2017. No other intraventricular debris identified. Following contrast, no definite abnormal ependymal or subependymal enhancement is identified. No increased periventricular FLAIR signal abnormality elsewhere, although there are several nonspecific foci of temporal horn periventricular FLAIR hyperintensity such as series 8, image 10 on the right. There is possible hyperdynamic flow at the cerebral aqueduct. No superimposed extra-axial collection or extra-axial diffusion restriction. No pachymeningeal thickening or enhancement. However, there is questionable abnormal enhancement of the leptomeninges within the bilateral internal auditory canals (series 13, images 10 and 11). This does not appear associated with discrete IAC soft tissue masses. However, the basilar turn of the right cochlea may also be abnormally enhancing (series 13, image 10). The other visible internal auditory structures appear grossly normal. Questionable also hyperenhancement of the cisternal right 5th nerve on series 14, image 11. No additional cranial nerve enhancement identified. Hippocampal complex signal and morphology appears symmetric and within normal limits. Mild for age scattered nonspecific parenchymal white matter T2 and FLAIR hyperintensity is stable. No other new parenchymal signal abnormality and abnormal parenchymal enhancement. Basilar cisterns appear normal. No midline shift, mass effect, evidence of mass lesion or  acute intracranial hemorrhage. Cervicomedullary junction and pituitary are within normal limits. Vascular: Major intracranial vascular flow voids are preserved and stable. Skull and upper cervical spine: Negative visible cervical spine. Normal bone marrow signal. Sinuses/Orbits: Normal orbits soft tissues aside from mild Disconjugate gaze. Paranasal Visualized paranasal sinuses and mastoids are stable and well pneumatized. Other: Scalp and face soft tissues appear negative. IMPRESSION: 1. Persistent abnormal signal layering in both occipital horns favored to be debris (see #2,3) rather than blood products. No associated intraventricular/subependymal enhancement. 2. Superimposed abnormal enhancement of the bilateral 7/8th cranial nerves, the basal turn of the right cochlea, and possibly also the cisternal segment of the right 5th nerve. This appears to be abnormal leptomeningeal type enhancement rather than due to cranial nerve masses. And although there is not widespread intracranial leptomeningeal abnormality I do note diffuse abnormal leptomeningeal enhancement of the lower thoracic spinal cord, conus, and cauda equina on 11/11/2017. 3. Recommend CSF  analysis. This constellation of findings could reflect leptomeningeal infection, noninfectious inflammation, or less likely carcinomatosis. Electronically Signed   By: Genevie Ann M.D.   On: 11/22/2017 13:58   Mr Cervical Spine Wo Contrast  Result Date: 11/15/2017 CLINICAL DATA:  Back pain beginning 2 months ago. Multiple recent falls. EXAM: MRI CERVICAL AND thoracic SPINE WITHOUT CONTRAST TECHNIQUE: Multiplanar and multiecho pulse sequences of the cervical spine, to include the craniocervical junction and cervicothoracic junction, and lumbar spine, were obtained without intravenous contrast. COMPARISON:  None. FINDINGS: MRI CERVICAL SPINE FINDINGS Alignment: Normal Vertebrae: No fracture or primary bone lesion. Cord: To cord compression or primary cord lesion.  Posterior Fossa, vertebral arteries, paraspinal tissues: Negative Disc levels: No abnormality at the foramen magnum, C1-2 or C2-3. C3-4: Right-sided predominant spondylosis. Mild to moderate right foraminal narrowing. No compressive central canal stenosis. C4-5 through C7-T1: Normal. MRI thoracic SPINE FINDINGS Alignment:  Normal Vertebrae: No fracture or primary bone lesion. Ordinary small endplate Schmorl's nodes in the midthoracic region. Cord: No evidence of primary cord lesion. No evidence of abnormal cord T2 signal. Ample subarachnoid space present dorsal to the cord. See below. Paraspinal and other soft tissues: Negative Disc levels: No abnormality at T6-7 or above. T7-8: Central disc herniation effaces the ventral subarachnoid space and contacts the ventral cord. No foraminal extension. No cord compression. T8-9: Small central disc herniation indents the ventral subarachnoid space and contacts the ventral cord. No foraminal extension. No cord compression. T9-10: Right paracentral disc herniation effaces the ventral subarachnoid space on the right and contacts the right ventral cord. No foraminal extension. No cord compression. T10-11: Central disc herniation with upward migration of disc material behind the T10 vertebral body. Effacement of the ventral subarachnoid space with indentation of the cord more on the left. Ample subarachnoid space present dorsal to the cord. No foraminal extension. T11-12: Normal. T12-L1: Normal. IMPRESSION: Cervical spine: No acute or traumatic finding. Right-sided spondylosis at C3-4 with mild to moderate right foraminal narrowing. Thoracic spine: No acute or traumatic finding. Central disc herniations at T7-8 and T8-9 without likely neural compression. Central to right-sided disc herniation at T9-10 without likely neural compression. These could certainly be associated with back pain. T10-11 central disc herniation with upward migration behind T10 with some indentation of the  left side of the cord. Ample subarachnoid space present dorsal to the cord, therefore cord compression is unlikely. This could also be associated with back pain however. Electronically Signed   By: Nelson Chimes M.D.   On: 11/15/2017 09:05   Mr Thoracic Spine Wo Contrast  Result Date: 11/15/2017 CLINICAL DATA:  Back pain beginning 2 months ago. Multiple recent falls. EXAM: MRI CERVICAL AND thoracic SPINE WITHOUT CONTRAST TECHNIQUE: Multiplanar and multiecho pulse sequences of the cervical spine, to include the craniocervical junction and cervicothoracic junction, and lumbar spine, were obtained without intravenous contrast. COMPARISON:  None. FINDINGS: MRI CERVICAL SPINE FINDINGS Alignment: Normal Vertebrae: No fracture or primary bone lesion. Cord: To cord compression or primary cord lesion. Posterior Fossa, vertebral arteries, paraspinal tissues: Negative Disc levels: No abnormality at the foramen magnum, C1-2 or C2-3. C3-4: Right-sided predominant spondylosis. Mild to moderate right foraminal narrowing. No compressive central canal stenosis. C4-5 through C7-T1: Normal. MRI thoracic SPINE FINDINGS Alignment:  Normal Vertebrae: No fracture or primary bone lesion. Ordinary small endplate Schmorl's nodes in the midthoracic region. Cord: No evidence of primary cord lesion. No evidence of abnormal cord T2 signal. Ample subarachnoid space present dorsal to the cord. See below. Paraspinal  and other soft tissues: Negative Disc levels: No abnormality at T6-7 or above. T7-8: Central disc herniation effaces the ventral subarachnoid space and contacts the ventral cord. No foraminal extension. No cord compression. T8-9: Small central disc herniation indents the ventral subarachnoid space and contacts the ventral cord. No foraminal extension. No cord compression. T9-10: Right paracentral disc herniation effaces the ventral subarachnoid space on the right and contacts the right ventral cord. No foraminal extension. No cord  compression. T10-11: Central disc herniation with upward migration of disc material behind the T10 vertebral body. Effacement of the ventral subarachnoid space with indentation of the cord more on the left. Ample subarachnoid space present dorsal to the cord. No foraminal extension. T11-12: Normal. T12-L1: Normal. IMPRESSION: Cervical spine: No acute or traumatic finding. Right-sided spondylosis at C3-4 with mild to moderate right foraminal narrowing. Thoracic spine: No acute or traumatic finding. Central disc herniations at T7-8 and T8-9 without likely neural compression. Central to right-sided disc herniation at T9-10 without likely neural compression. These could certainly be associated with back pain. T10-11 central disc herniation with upward migration behind T10 with some indentation of the left side of the cord. Ample subarachnoid space present dorsal to the cord, therefore cord compression is unlikely. This could also be associated with back pain however. Electronically Signed   By: Nelson Chimes M.D.   On: 11/15/2017 09:05   Mr Lumbar Spine W Wo Contrast  Result Date: 11/11/2017 CLINICAL DATA:  Back pain.  Cauda equina syndrome suspected. EXAM: MRI LUMBAR SPINE WITHOUT AND WITH CONTRAST TECHNIQUE: Multiplanar and multiecho pulse sequences of the lumbar spine were obtained without and with intravenous contrast. CONTRAST:  16 mL MultiHance. COMPARISON:  CT abdomen and pelvis 11/02/2017 FINDINGS: Segmentation: 5 non rib-bearing lumbar type vertebral bodies are present. Alignment:  AP alignment is anatomic. Vertebrae:  Marrow signal and vertebral body heights are normal. Conus medullaris and cauda equina: Conus extends to the L1-2 level. Conus and cauda equina appear normal. Paraspinal and other soft tissues: Limited imaging of the abdomen is unremarkable. There is no significant adenopathy. Disc levels: L1-2: Negative. L2-3: Negative. L3-4: Mild facet hypertrophy is present bilaterally. No significant disc  protrusion or stenosis is present. L4-5: A right paramedian disc protrusion and annular tear is present. There is potential impact on the traversing right L5 nerve roots. The foramina are patent. L5-S1: A shallow central disc protrusion and annular tear is present without focal stenosis. The foramina are patent. IMPRESSION: 1. Right paramedian disc protrusion and annular tear at L4-5 with potential impact on the traversing right L5 nerve roots. 2. Shallow central disc protrusion and annular tear at L5-S1 without focal stenosis. Electronically Signed   By: San Morelle M.D.   On: 11/11/2017 12:04   Ct Abdomen Pelvis W Contrast  Result Date: 11/05/2017 CLINICAL DATA:  Lower pelvic pain, diagnosed with bladder cancer 2 months ago, undergoing intravesicular treatments, history coronary artery disease post MI, diabetes mellitus, hypertension EXAM: CT ABDOMEN AND PELVIS WITH CONTRAST TECHNIQUE: Multidetector CT imaging of the abdomen and pelvis was performed using the standard protocol following bolus administration of intravenous contrast. Sagittal and coronal MPR images reconstructed from axial data set. CONTRAST:  163m ISOVUE-300 IOPAMIDOL (ISOVUE-300) INJECTION 61% IV. No oral contrast. COMPARISON:  01/05/2017 FINDINGS: Lower chest: Lung bases clear Hepatobiliary: Gallbladder and liver normal appearance Pancreas: Normal appearance Spleen: Normal appearance Adrenals/Urinary Tract: Minimal chronic thickening of LEFT adrenal gland without discrete mass. Kidneys and ureters normal appearance. Foley catheter decompresses urinary bladder. Bladder  mass identified on the previous exam not visualized. Stomach/Bowel: Appendix surgically absent by history. Stomach and bowel loops normal appearance Vascular/Lymphatic: Atherosclerotic calcifications aorta and coronary arteries without aortic aneurysm. Few normal sized to mildly enlarged lymph nodes at the LEFT inguinal region. Reproductive: Mild prostatic enlargement  gland measuring 5.9 x 4.1 x 3.2 cm. Seminal vesicles unremarkable. Other: Infiltrative changes at LEFT inguinal region, by history inguinal herniorrhaphy 08/15/2017. Small focal fluid collection at LEFT inguinal hernia repair 21 x 9 mm image 83 question seroma or lymphocele, infection considered less likely this far removed from surgery though not completely excluded. No free intraperitoneal air or fluid. Musculoskeletal: Unremarkable IMPRESSION: Prostatic enlargement. No acute intra-abdominal or intrapelvic abnormalities. Postoperative changes of LEFT inguinal herniorrhaphy with reactive lymph nodes and a 21 x 9 mm fluid collection at the surgical bed favor seroma versus lymphocele as discussed above. Aortic Atherosclerosis (ICD10-I70.0). Electronically Signed   By: Lavonia Dana M.D.   On: 11/01/2017 16:55   Dg Chest Port 1 View  Result Date: 11/24/2017 CLINICAL DATA:  Respiratory failure.  Hypoxia. EXAM: PORTABLE CHEST 1 VIEW COMPARISON:  11/23/2017.  11/22/2017.  10/22/2015. FINDINGS: Endotracheal tube and NG tube in stable position. Mediastinum and hilar structures are normal. Mild left base atelectasis/infiltrate again noted. No interim change. No prominent pleural effusion or pneumothorax. Old right clavicular fracture. IMPRESSION: 1.  Lines and tubes in stable position. 2. Persistent left base atelectasis/infiltrate. Continued follow-up exam suggested to demonstrate clearing in order to exclude a underlying mass lesion. Electronically Signed   By: Marcello Moores  Register   On: 11/24/2017 07:36   Dg Chest Port 1 View  Result Date: 11/23/2017 CLINICAL DATA:  62 year old male with respiratory failure and intubated after aspiration. Unresponsive. EXAM: PORTABLE CHEST 1 VIEW COMPARISON:  11/22/2017 and earlier. FINDINGS: Portable AP semi upright view at 0607 hours. Endotracheal tube tip in good position at the level the clavicles. Enteric tube loops in the proximal stomach. Mediastinal contours remain normal.  Stable lung volumes. Continued Patchy and confluent retrocardiac opacity. No other confluent opacity. No pneumothorax, pulmonary edema or pleural effusion. Paucity bowel gas in the upper abdomen. Stable right clavicle deformity. IMPRESSION: 1.  Stable lines and tubes. 2. Persistent patchy retrocardiac opacity compatible with aspiration and/or pneumonia in this clinical setting. No pleural effusion is evident. 3. No new cardiopulmonary abnormality. Electronically Signed   By: Genevie Ann M.D.   On: 11/23/2017 10:16   Dg Chest Port 1 View  Result Date: 11/22/2017 CLINICAL DATA:  Respiratory failure, history of hypertension, diabetes, and previous MI. Nonsmoker. EXAM: PORTABLE CHEST 1 VIEW COMPARISON:  Portable chest x-ray of November 21, 2017 FINDINGS: The lungs are well-expanded. There is persistent atelectasis or pneumonia at the left lung base. The right lung is clear. The heart and pulmonary vascularity are normal. The endotracheal tube tip projects 4.7 cm above the carina. The esophagogastric tube tip and proximal port project below the inferior margin of the image. IMPRESSION: Persistent left lower lobe atelectasis or pneumonia. The support tubes are in reasonable position. Electronically Signed   By: David  Martinique M.D.   On: 11/22/2017 08:50   Portable Chest Xray  Result Date: 11/21/2017 CLINICAL DATA:  Nasogastric tube placement. EXAM: PORTABLE CHEST 1 VIEW COMPARISON:  Abdominal radiograph November 20, 2016 FINDINGS: Nasogastric tube tip projects over the spine. Endotracheal tube tip projects 4.2 cm above the carina. Cardiomediastinal silhouette is normal. Calcified aortic arch. Patchy LEFT greater than RIGHT lower lobe airspace opacities without pleural effusion. No pneumothorax. Soft  tissue planes and included osseous structures are unchanged. Old RIGHT clavicle fracture. IMPRESSION: Nasogastric tube tip projecting in distal stomach. Stable position of endotracheal tube. Patchy bibasilar airspace  opacities concerning for pneumonia. Electronically Signed   By: Elon Alas M.D.   On: 11/21/2017 03:45   Portable Chest X-ray  Result Date: 11/20/2017 CLINICAL DATA:  Intubated EXAM: PORTABLE CHEST 1 VIEW COMPARISON:  Chest radiograph from earlier today. FINDINGS: Endotracheal tube tip is 4.5 cm above the carina. Enteric tube terminates in the lower thoracic esophagus. Stable cardiomediastinal silhouette with normal heart size. No pneumothorax. No pleural effusion. Patchy opacities at both lung bases, left greater than right, not appreciably changed. IMPRESSION: 1. Well-positioned endotracheal tube. 2. Enteric tube terminates in the lower thoracic esophagus and should be advanced 10 cm. 3. Stable patchy bibasilar lung opacities, left greater than right, suspicious for pneumonia. These results were called by telephone at the time of interpretation on 11/20/2017 at 12:27 pm to [place], who verbally acknowledged these results. Electronically Signed   By: Ilona Sorrel M.D.   On: 11/20/2017 12:28   Dg Chest Port 1 View  Result Date: 11/20/2017 CLINICAL DATA:  62 year old male with altered mental status and sepsis. EXAM: PORTABLE CHEST 1 VIEW COMPARISON:  Chest radiograph dated 10/22/2015 FINDINGS: Left lung base atelectatic changes. Infiltrate is not excluded. Clinical correlation is recommended the right lung is clear. No pleural effusion or pneumothorax. The cardiac silhouette is within normal limits. No acute osseous pathology. IMPRESSION: Left lung base atelectasis. Infiltrate is not excluded. Clinical correlation is recommended. Electronically Signed   By: Anner Crete M.D.   On: 11/20/2017 02:04   Dg Abd Portable 1v  Result Date: 11/22/2017 CLINICAL DATA:  OG tube placement EXAM: PORTABLE ABDOMEN - 1 VIEW COMPARISON:  CT 10/31/2017 FINDINGS: Esophageal tube tip projects over the proximal stomach. Visible gas pattern is unremarkable IMPRESSION: Esophageal tube tip overlies the  proximal stomach. Electronically Signed   By: Donavan Foil M.D.   On: 11/22/2017 20:51    Assessment and Plan:   1.  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  Neurologic-altered mental status, leg weakness/numbness, and bowel/bladder dysfunction secondary to #1 3.  Seizure-maintained on Keppra 4.  Respiratory failure secondary to #1 and pneumonia 5.  History of "bladder cancer ", status post transurethral resection of bladder tumors in July and August 2018, high-grade urothelial carcinoma identified-suspicion for superficial invasion July 2019, negative pathology August 2018  6.  Diabetes 7.  Anorexia/weight loss secondary to #1 8.  Urinary tract infection   Mr. Dileonardo has been diagnosed with carcinomatous meningitis.  There is no apparent primary tumor site upon review of his history, physical examination, and radiologic evaluation to date.  He has a history of superficial bladder cancer treated with transurethral resection and his family report he received subsequent BCG therapy.  It is possible he has developed carcinomatosis related to bladder cancer, but this would be unusual. I discussed the cytology with Dr. Lyndon Code.  He is confident the tumor cells are reflective of carcinoma as opposed to a hematopoietic malignancy.  There is no clinical history or physical exam/radiologic findings to suggest a diagnosis of lymphoma.  I discussed the cytology findings and prognosis with his family.  I explained the general prognosis for patients with carcinomatous meningitis is very poor.  Treatment is generally unsuccessfull if the CNS metastases are related to a solid tumor.  Recommendations: 1.  Continue management of respiratory failure and supportive care per  critical care medicine 2.  Chest CT  I will see him in the a.m. on 11/25/2017.  I discussed the case with the critical care service.  Betsy Coder, MD 11/24/2017, 4:38 PM

## 2017-11-24 NOTE — Progress Notes (Signed)
Reason for consult: AMS, weakness  Subjective: No change in exam.    ROS:  Unable to obtain due to poor mental status  Examination  Vital signs in last 24 hours: Temp:  [98.3 F (36.8 C)-99.1 F (37.3 C)] 98.9 F (37.2 C) (02/28 0736) Pulse Rate:  [70-95] 95 (02/28 0700) Resp:  [16-22] 22 (02/28 0700) BP: (120-157)/(66-89) 150/81 (02/28 0700) SpO2:  [97 %-100 %] 97 % (02/28 0700) FiO2 (%):  [35 %] 35 % (02/28 0900) Weight:  [76 kg (167 lb 8.8 oz)] 76 kg (167 lb 8.8 oz) (02/28 0400)  General: Not in distress, cooperative CVS: pulse-normal rate and rhythm RS: breathing comfortably, intubated Extremities: normal   Neuro: MS: not following commands CN: pupils equal and reactive,  EOMI, face symmetric, tongue midline, normal sensation over face, Motor: localized in both upper extremities, no movement of both legs Gait: not tested  Basic Metabolic Panel: Recent Labs  Lab 11/21/17 0440 11/22/17 0418 11/22/17 1047 11/23/17 0511 11/23/17 1933 11/24/17 0429  NA 139 142 145 141  --  143  K 4.0 4.2 4.2 4.2  --  4.8  CL 105 107 110 107  --  109  CO2 25 26 27 25   --  26  GLUCOSE 334* 251* 131* 294*  --  290*  BUN 61* 57* 55* 51*  --  39*  CREATININE 1.09 0.87 0.90 0.85  --  0.74  CALCIUM 9.1 9.1 9.3 8.6*  --  8.8*  MG 2.1 2.3  --  2.3 2.2 2.1  PHOS 1.7* 3.2  --  3.5 3.5 3.0    CBC: Recent Labs  Lab 11/21/17 0440 11/22/17 0418 11/22/17 1047 11/23/17 0511 11/24/17 0429  WBC 5.8 12.6* 11.8* 13.1* 16.0*  HGB 9.2* 9.6* 10.0* 8.1* 8.6*  HCT 29.1* 30.4* 31.5* 25.8* 27.9*  MCV 90.4 90.2 91.0 92.8 92.1  PLT 306 325 314 219 236     Coagulation Studies: No results for input(s): LABPROT, INR in the last 72 hours.  Imaging Reviewed:     ASSESSMENT AND PLAN  Carcinomatous meningitis  Recommend Oncology consult Repeat CT head to look for hydrocephalus given poor mental status and slightly enlarged ventricles on last MRI brain.   Karena Addison Tanetta Fuhriman [facility] Pager Number [phone] For questions after 7pm please refer to AMION to reach the Neurologist on call

## 2017-11-24 NOTE — Progress Notes (Signed)
Nutrition Follow-up  DOCUMENTATION CODES:   Severe malnutrition in context of chronic illness  INTERVENTION:   Continue:  Vital AF 1.2 @ 35 ml/hr and increase by 10 ml every 12 hours to goal rate of 75 ml/hr (1800 ml/day)  Provides:  2160 kcal, 135 grams protein, and 1459 ml free water.   NUTRITION DIAGNOSIS:   Severe Malnutrition(Chronic context) related to (Abdominal pian (worse w/ intake), weakness, lack of any appetite,) as evidenced by energy intake < or equal to 75% for > or equal to 1 month, percent weight loss. Ongoing.   GOAL:   Patient will meet greater than or equal to 90% of their needs Met.   MONITOR:   PO intake, Supplement acceptance, Diet advancement, Labs, Weight trends  ASSESSMENT:   Pt with PMH of DM, HTN, HLD, CAD, anemia, recent TURP last July and August for bladder tumor admitted 2/14 to [facility] generalized pelvic pain & numbness/weakness of BLE x3 weeks. Reports loss of 35 lbs x3 months due to decreased appetite and abd pain/distention. Pt found to have hyponatremia which was treated. 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 Pt with sepsis from aspiration vs HCAP and UTI. Has required intermittent BiPAP. On 2/25 pt was intubated then suffered a brief VT arrest and transferred to [facility]. Pt noted to develop stage II pressure injury on sacrum.   Pt discussed during ICU rounds and with RN.  Per wife pt was 210-220 lb in Jan 2019 and has lost down to 167 lb in just a matter of a few weeks. She reports that he went down hill quickly.  LP consistent with leptomeninges carcinomatosis - oncology consulted   Patient is currently intubated on ventilator support MV: 11.3 L/min Temp (24hrs), Avg:99 F (37.2 C), Min:98.3 F (36.8 C), Max:100.4 F (38 C)  Medications reviewed and include: SSI, levemir, solu-medrol, MVI Labs reviewed CBG's: 263-240   Diet Order:  Diet NPO time specified  EDUCATION NEEDS:   No education  needs have been identified at this time  Skin:  Skin Integrity Issues:: Stage II(MASD - buttocks) Stage II: sacrum  Last BM:  2/28 medium  Height:   Ht Readings from Last 1 Encounters:  11/21/17 _0  (1.854 m)    Weight:   Wt Readings from Last 1 Encounters:  11/24/17 167 lb 8.8 oz (76 kg)    Ideal Body Weight:  83.6 kg  BMI:  Body mass index is 22.11 kg/m.  Estimated Nutritional Needs:   Kcal:  2150  Protein:  120-145 grams  Fluid:  >2.4 L (30 ml/kg bw)  Maylon Peppers RD, [place], [place] Pager [phone] After Hours Pager

## 2017-11-25 ENCOUNTER — Inpatient Hospital Stay (HOSPITAL_COMMUNITY): Payer: Federal, State, Local not specified - PPO

## 2017-11-25 DIAGNOSIS — Z515 Encounter for palliative care: Secondary | ICD-10-CM

## 2017-11-25 DIAGNOSIS — Z7189 Other specified counseling: Secondary | ICD-10-CM

## 2017-11-25 LAB — GLUCOSE, CAPILLARY
GLUCOSE-CAPILLARY: 291 mg/dL — AB (ref 65–99)
GLUCOSE-CAPILLARY: 312 mg/dL — AB (ref 65–99)
Glucose-Capillary: 242 mg/dL — ABNORMAL HIGH (ref 65–99)
Glucose-Capillary: 359 mg/dL — ABNORMAL HIGH (ref 65–99)
Glucose-Capillary: 328 mg/dL — ABNORMAL HIGH (ref 65–99)
Glucose-Capillary: 339 mg/dL — ABNORMAL HIGH (ref 65–99)

## 2017-11-25 LAB — CULTURE, BLOOD (ROUTINE X 2)
CULTURE: NO GROWTH
CULTURE: NO GROWTH
Special Requests: ADEQUATE

## 2017-11-25 LAB — MAGNESIUM: Magnesium: 2.1 mg/dL (ref 1.7–2.4)

## 2017-11-25 LAB — BASIC METABOLIC PANEL
Anion gap: 10 (ref 5–15)
BUN: 33 mg/dL — ABNORMAL HIGH (ref 6–20)
CALCIUM: 8.8 mg/dL — AB (ref 8.9–10.3)
CHLORIDE: 106 mmol/L (ref 101–111)
CO2: 25 mmol/L (ref 22–32)
CREATININE: 0.74 mg/dL (ref 0.61–1.24)
GFR calc non Af Amer: >60 mL/min (ref >60)
GLUCOSE: 320 mg/dL — AB (ref 65–99)
Potassium: 4.5 mmol/L (ref 3.5–5.1)
Sodium: 141 mmol/L (ref 135–145)

## 2017-11-25 LAB — ACID FAST SMEAR (AFB, MYCOBACTERIA)

## 2017-11-25 LAB — PHOSPHORUS: PHOSPHORUS: 3.1 mg/dL (ref 2.5–4.6)

## 2017-11-25 LAB — HSV(HERPES SMPLX VRS)ABS-I+II(IGG)-CSF: HSV Type I/II Ab, IgG CSF: 25.54 IV — ABNORMAL HIGH (ref <=0.89)

## 2017-11-25 LAB — PROCALCITONIN: Procalcitonin: 0.11 ng/mL

## 2017-11-25 MED ORDER — INSULIN DETEMIR 100 UNIT/ML ~~LOC~~ SOLN
22.0000 [IU] | Freq: Two times a day (BID) | SUBCUTANEOUS | Status: DC
  Administered 2017-11-25 – 2017-11-26 (×4): 22 [IU] via SUBCUTANEOUS
  Filled 2017-11-25 (×4): qty 0.22

## 2017-11-25 NOTE — Progress Notes (Addendum)
F/U on Repeat CT head- does show hydrocephalus which appears similar compared to changes seen on MRI brain on 2.26. 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 Oncology feels patient has very poor prognosis. Family considering withdrawal of care.   No further recommendations, Neurology will sign off.

## 2017-11-25 NOTE — Progress Notes (Deleted)
Went by to FU for appt with pt's sister at 2pm. She did not show for appt. Pt unable to participate in [place] discussions Thank you, Romona Curls NP

## 2017-11-25 NOTE — Consult Note (Addendum)
Consultation Note Date: 11/25/2017   Patient Name: Raymond [name]  DOB: 03-13-1956  MRN: [id number]  Age / Sex: 62 y.o., male  PCP: Caren Macadam, MD Referring Physician: Rigoberto Noel, MD  Reason for Consultation: Establishing goals of care, Psychosocial/spiritual support, Terminal Care and Withdrawal of life-sustaining treatment  HPI/Patient Profile: 62 y.o. male  with past medical history of bladder cancer admitted on 11/02/2017 with weakness, hyponatremia, weight loss.  Patient was initially admitted to [facility] on 11/21/2017 with paralysis of lower extremities. He subsequently required intubation after a V. tach arrest.  Family describes a rapid functional decline beginning in January of increased weakness, inability to urinate, fatigue and pain.  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   Consult ordered for goals of care  Clinical Assessment and Goals of Care: Patient seen, chart reviewed.  Met with patient's wife Butch Penny as well as son, daughter and patient's sister.  They are aware of patient's grim prognosis and that there is nothing more to be done.  Given that, family has decided eventually to undergo a one-way extubation, full comfort care after family members, over the weekend to say their goodbyes  Patient is unable to speak for himself secondarily to being critically ill, intubated.  His healthcare proxy is his wife, Franky Reier at [phone]    SUMMARY OF RECOMMENDATIONS   DNR Likely will perform one-way extubation by 24-Dec-2017.  Wife would like to allow time for other family members to visit Explained to wife in detail how we go about ensuring comfort with the removal of the ventilator Not full comfort care yet.  Continue antibiotics as well as  feedings, blood sugar checks as well as insulin Palliative medicine to meet with family again on 2017-12-24 at 10 AM If patient were to self extubate, do not reinsert Code Status/Advance Care Planning:  DNR    Symptom Management:   Pain/dyspnea: Continue with fentanyl infusion.  Monitor and titrate for effect  Seizure prophylaxis: Continue with Keppra  Agitation: Continue with Versed 2 mg every 2 hours as needed; monitor for need for continuous infusion  Palliative Prophylaxis:   Aspiration, Bowel Regimen, Delirium Protocol, Eye Care, Frequent Pain Assessment, Oral Care and Turn Reposition  Additional Recommendations (Limitations, Scope, Preferences):  Avoid Hospitalization, Minimize Medications, No Chemotherapy, No Hemodialysis, No Radiation, No Surgical Procedures and No Tracheostomy  Psycho-social/Spiritual:   Desire for further Chaplaincy support:no  Additional Recommendations: Grief/Bereavement Support  Prognosis:   < 2 weeks in the setting of carcinomatosis meningitis, acute respiratory failure requiring intubation, history of bladder cancer.  Patient is unresponsive and not able to take anything by mouth  Discharge Planning: To Be Determined      Primary Diagnoses: Present on Admission: . Hyponatremia . Hypophosphatemia . Endotracheally intubated . Normocytic anemia   I have reviewed the medical record, interviewed the patient and family, and examined the patient. The following aspects are pertinent.  Past Medical History:  Diagnosis Date  . Anemia   . Arthritis    Hands and left knee  . Coronary artery disease   . Diabetes mellitus without complication ([place])   . High cholesterol   . Hypertension   . MI (myocardial infarction) ([place]) 2009   Social History   Socioeconomic History  . Marital status: Married    Spouse name: None  . Number of children: None  . Years of education: None  . Highest education level: None  Social Needs  . Financial  resource strain: None  . Food insecurity - worry: None  . Food insecurity - inability: None  . Transportation needs - medical: None  . Transportation needs - non-medical: None  Occupational History  . None  Tobacco Use  . Smoking status: Never Smoker  . Smokeless tobacco: Never Used  Substance and Sexual Activity  . Alcohol use: No  . Drug use: No  . Sexual activity: Yes    Birth control/protection: None  Other Topics Concern  . None  Social History Narrative   Grew up in [place], [place]. Married, has 3 children.   Disability, 2009.    Worked in Careers information officer prior.    Family History  Problem Relation Age of Onset  . Diabetes Mother   . Heart attack Mother   . Cancer Father        lung  . Stroke Paternal Grandfather   . Colon cancer Paternal Uncle    Scheduled Meds: . acetaminophen (TYLENOL) oral liquid 160 mg/5 mL  650 mg Per Tube Q8H  . chlorhexidine gluconate (MEDLINE KIT)  15 mL Mouth Rinse BID  . insulin aspart  0-20 Units Subcutaneous Q4H  . insulin detemir  22 Units Subcutaneous BID  . mouth rinse  15 mL Mouth Rinse 10 times per day  . methylPREDNISolone (SOLU-MEDROL) injection  40 mg Intravenous Q12H  . multivitamin  15 mL Per Tube Daily  . pantoprazole sodium  40 mg Per Tube Daily   Continuous Infusions: . ampicillin-sulbactam (UNASYN) IV Stopped (11/25/17 1125)  . feeding supplement (VITAL AF 1.2 CAL) 1,000 mL (11/25/17 1101)  . levETIRAcetam Stopped (11/25/17 0639)   PRN Meds:.albuterol, bisacodyl, fentaNYL (SUBLIMAZE) injection, hydrALAZINE, metoprolol tartrate, midazolam, ondansetron **OR** ondansetron (ZOFRAN) IV, phenol Medications Prior to Admission:  Prior to Admission medications   Medication Sig Start Date End Date Taking? Authorizing Provider  aspirin 325 MG tablet Take 325 mg by mouth at bedtime.    Yes [provider]  celecoxib (CELEBREX) 200 MG capsule Take 200 mg by mouth daily as needed for mild pain.    Yes [provider]  Choline Fenofibrate (FENOFIBRIC ACID) 135 MG CPDR Take 135 mg by mouth at bedtime.    Yes [provider]  CINNAMON PO Take 1,000 mg by mouth at bedtime.    Yes [provider]  clopidogrel (PLAVIX) 75 MG tablet TAKE 1 TABLET DAILY Patient taking differently: TAKE 1 TABLET AT BEDTIME 02/23/13  Yes Lorretta Harp, MD  Coenzyme Q10 (COQ10) 100 MG CAPS Take 100 mg by mouth at bedtime.   Yes [provider]  ezetimibe (ZETIA) 10 MG tablet Take 10 mg by mouth at bedtime.    Yes [provider]  ibuprofen (ADVIL,MOTRIN) 200 MG tablet Take 400 mg every 8 (eight) hours as needed by mouth for mild pain.    Yes [provider]  insulin lispro protamine-lispro (HUMALOG 75/25 MIX) (75-25) 100 UNIT/ML SUSP injection Inject 30-35 Units  into the skin 2 (two) times daily with a meal. Patient takes 35 units in the morning and 30 units at night   Yes [provider]  metFORMIN (GLUCOPHAGE) 500 MG tablet Take 1,000 mg by mouth 2 (two) times daily with a meal. 03/09/17  Yes [provider]  metoprolol succinate (TOPROL-XL) 25 MG 24 hr tablet Take 25 mg by mouth at bedtime. 02/08/17  Yes [provider]  Multiple Vitamin (MULTIVITAMIN) tablet Take 1 tablet by mouth daily.   Yes [provider]  naproxen sodium (ANAPROX) 220 MG tablet Take 440 mg by mouth daily as needed (pain).   Yes [provider]  nitroGLYCERIN (NITROSTAT) 0.4 MG SL tablet Place 0.4 mg every 5 (five) minutes as needed under the tongue for chest pain.   Yes [provider]  Omega-3 Fatty Acids (FISH OIL) 1200 MG CAPS Take 1,200 mg by mouth at bedtime.    Yes [provider]  rosuvastatin (CRESTOR) 40 MG tablet Take 40 mg by mouth at bedtime.    Yes [provider]  sulfamethoxazole-trimethoprim (BACTRIM DS,SEPTRA DS) 800-160 MG tablet Take 1 tablet by mouth 2 (two) times daily.  11/02/17  Yes [provider]   tamsulosin (FLOMAX) 0.4 MG CAPS capsule Take 0.4 mg by mouth at bedtime.   Yes [provider]  telmisartan (MICARDIS) 80 MG tablet Take 80 mg by mouth at bedtime.    Yes [provider]  traMADol (ULTRAM) 50 MG tablet Take 50 mg by mouth every 8 (eight) hours as needed for moderate pain.  11/07/17  Yes [provider]  traZODone (DESYREL) 100 MG tablet Take 50-100 mg by mouth at bedtime as needed.  11/07/17  Yes [provider]  VICTOZA 18 MG/3ML SOPN Inject 1.8 mg into the skin daily. 03/03/17  Yes [provider]   No Known Allergies Review of Systems  Unable to perform ROS: Intubated    Physical Exam  Constitutional:  Acutely ill-appearing older man; intubated, unresponsive  HENT:  Head: Normocephalic and atraumatic.  Cardiovascular:  Irregular  Pulmonary/Chest:  Intubated  Genitourinary:  Genitourinary Comments: Foley  Neurological:  Minimally responsive even to noxious stimuli  Skin: Skin is warm and dry.  Psychiatric:  No overt agitation otherwise unable to test  Nursing note and vitals reviewed.   Vital Signs: BP 112/73   Pulse (!) 107   Temp (!) 101.9 F (38.8 C) (Axillary)   Resp 14   Ht _0  (1.854 m)   Wt 76.2 kg (167 lb 15.9 oz)   SpO2 98%   BMI 22.16 kg/m  Pain Assessment: CPOT POSS *See Group Information*: 2-Acceptable,Slightly drowsy, easily aroused Pain Score: Asleep   SpO2: SpO2: 98 % O2 Device:SpO2: 98 % O2 Flow Rate: .O2 Flow Rate (L/min): 5 L/min  IO: Intake/output summary:   Intake/Output Summary (Last 24 hours) at 11/25/2017 1620 Last data filed at 11/25/2017 1300 Gross per 24 hour  Intake 2085 ml  Output 1975 ml  Net 110 ml    LBM: Last BM Date: 11/24/17 Baseline Weight: Weight: 81.2 kg (179 lb 0.2 oz) Most recent weight: Weight: 76.2 kg (167 lb 15.9 oz)     Palliative Assessment/Data:   Flowsheet Rows     Most Recent Value  Intake Tab  Referral Department  Critical care  Unit at Time  of Referral  ICU  Palliative Care Primary Diagnosis  Cancer  Date Notified  11/25/17  Reason for referral  Clarify Goals of Care, End  of Life Care Assistance, Psychosocial or Spiritual support  Date of Admission  11/24/2017  Date first seen by Palliative Care  11/25/17  # of days Palliative referral response time  0 Day(s)  # of days IP prior to Palliative referral  15  Clinical Assessment  Palliative Performance Scale Score  20%  Pain Max last 24 hours  Not able to report  Pain Min Last 24 hours  Not able to report  Dyspnea Max Last 24 Hours  Not able to report  Dyspnea Min Last 24 hours  Not able to report  Nausea Max Last 24 Hours  Not able to report  Nausea Min Last 24 Hours  Not able to report  Anxiety Max Last 24 Hours  Not able to report  Anxiety Min Last 24 Hours  Not able to report  Other Max Last 24 Hours  Not able to report  Psychosocial & Spiritual Assessment  Palliative Care Outcomes  Patient/Family meeting held?  Yes  Who was at the meeting?  wife, son, dtr and pt's sister  Patient/Family wishes: Interventions discontinued/not started   Hemodialysis, PEG, Trach, Vasopressors  Palliative Care follow-up planned  Yes, Facility      Time In: 8867 Time Out: 1630 Time Total: 75 min Greater than 50%  of this time was spent counseling and coordinating care related to the above assessment and plan.  Signed by: Dory Horn, NP   Please contact Palliative Medicine Team phone at [phone] for questions and concerns.  For individual provider: See Shea Evans

## 2017-11-25 NOTE — Progress Notes (Addendum)
ETT secure. No wean due to pt not following commands. MD at the bedside. No breakdown noted. Decreased FIO2 30% due to stable sats. NIF and VC not done due to pt unable to do

## 2017-11-25 NOTE — Progress Notes (Signed)
PULMONARY / CRITICAL CARE MEDICINE   Name: Raymond [name] MRN: [id number] DOB: [birth year]    ADMISSION DATE:  11/24/2017 CONSULTATION DATE:  11/21/17  REFERRING MD:  Dr. Dyann Kief  CHIEF COMPLAINT:  Respiratory failure  BRIEF SUMMARY:  62 year old male with past medical history of CAD, DMT2, HTN, HLD, MI, arthritis, anemia, and TURP (04/2017) secondary to bladder tumor admitted to [facility] on 2/14 with generalized weakness, pelvic pain, and numbness for 3 months.  He was seen in office on 2/14 by primary MD with reported 3 month hx of weight loss (40lbs) and functional decline.  He was referred for direct admit at [facility].    Initial presentation felt to be due to hyponatremia which was noted on admit, but symptoms persisted after sodium was corrected.  He had MRI that was negative. 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  Course complicated by sepsis from aspiration vs HCAP and UTI.  He intermittently required BiPAP.  On 2/25, he was on BiPAP and due to concern for further decline and increased risk of aspiration, he was subsequently intubated.  He suffered a brief VT arrest post intubation.  The patient was transferred to [facility] 2/25 for further evaluation and management. Cerebral spinal fluid with cancerous cells.  SUBJECTIVE:  Currently on full sedation.  Poor prognosis per oncology consult. VITAL SIGNS: BP 139/73   Pulse (!) 104   Temp (!) 102.2 F (39 C) (Axillary)   Resp 19   Ht 6\' 1"  (1.854 m)   Wt 76.2 kg (167 lb 15.9 oz)   SpO2 100%   BMI 22.16 kg/m   HEMODYNAMICS:    VENTILATOR SETTINGS: Vent Mode: PRVC FiO2 (%):  [30 %-40 %] 30 % Set Rate:  [14 bmp-15 bmp] 15 bmp Vt Set:  [550 mL] 550 mL PEEP:  [5 cmH20] 5 cmH20 Plateau Pressure:  [14 cmH20-18 cmH20] 14 cmH20  INTAKE / OUTPUT: I/O last 3 completed shifts: In: 3431.8 [NG/GT:2516.8; IV Piggyback:915] Out: 4085 [Urine:4085]  PHYSICAL  EXAMINATION: General: Cachectic male on full mechanical ventilatory support HEENT: Tracheal tube to ventilator, gastric tube with tube feedings PSY: None available Neuro: Some spontaneous movement right side CV: Heart sounds are regular PULM: Mild rhonchi bilaterally PP:JKDT, non-tender, bsx4 active  Extremities: warm/dry, negative edema  Skin: no rashes or lesions   LABS:  BMET Recent Labs  Lab 11/23/17 0511 11/24/17 0429 11/25/17 0423  NA 141 143 141  K 4.2 4.8 4.5  CL 107 109 106  CO2 25 26 25   BUN 51* 39* 33*  CREATININE 0.85 0.74 0.74  GLUCOSE 294* 290* 320*    Electrolytes Recent Labs  Lab 11/23/17 0511  11/24/17 0429 11/24/17 1642 11/25/17 0423  CALCIUM 8.6*  --  8.8*  --  8.8*  MG 2.3   < > 2.1 2.1 2.1  PHOS 3.5   < > 3.0 3.0 3.1   < > = values in this interval not displayed.    CBC Recent Labs  Lab 11/22/17 1047 11/23/17 0511 11/24/17 0429  WBC 11.8* 13.1* 16.0*  HGB 10.0* 8.1* 8.6*  HCT 31.5* 25.8* 27.9*  PLT 314 219 236    Coag's No results for input(s): APTT, INR in the last 168 hours.  Sepsis Markers Recent Labs  Lab 11/20/17 0155 11/24/17 0911 11/25/17 0423  PROCALCITON 1.67 0.18 0.11    ABG Recent Labs  Lab 11/21/17 0433 11/21/17 1725 11/22/17  0116  PHART 7.511* 7.480* 7.485*  PCO2ART 34.6 38.2 38.7  PO2ART 98.3 114* 114*    Liver Enzymes Recent Labs  Lab 11/21/17 0440 11/22/17 1047  AST 83* 92*  ALT 42 60  ALKPHOS 36* 39  BILITOT 0.4 0.2*  ALBUMIN 2.0* 1.8*    Cardiac Enzymes Recent Labs  Lab 11/20/17 1414 11/20/17 2128 11/21/17 0211  TROPONINI 0.63* 0.96* 0.74*    Glucose Recent Labs  Lab 11/24/17 1048 11/24/17 1451 11/24/17 1925 11/24/17 2322 11/25/17 0304 11/25/17 0833  GLUCAP 240* 216* 299* 269* 242* 359*    Imaging Ct Chest W Contrast  Result Date: 11/24/2017 CLINICAL DATA:  Concern for Guillain-barre syndrome complicated by seizures and sepsis with acute respiratory failure. CPR  performed outside hospital. History of bladder tumor. EXAM: CT CHEST WITH CONTRAST TECHNIQUE: Multidetector CT imaging of the chest was performed during intravenous contrast administration. CONTRAST:  72mL ISOVUE-300 IOPAMIDOL (ISOVUE-300) INJECTION 61% COMPARISON:  CT abdomen 01/05/2017 FINDINGS: Cardiovascular: Heart is normal size. Calcified plaque over the 3 vessel coronary arteries. Vascular structures are otherwise within normal. Mediastinum/Nodes: No mediastinal or hilar adenopathy. Lungs/Pleura: Tracheostomy tube in adequate position. Lungs are adequately inflated demonstrate mild bilateral patchy hazy airspace opacification worse in the left lower lobe likely with associated left basilar atelectasis. Findings are concerning for infection and less likely inflammatory process. Airways are otherwise within normal. Upper Abdomen: Nasogastric tube is present with tip over the gastric fundus. No acute findings. Musculoskeletal: Minimal degenerate change of the spine. IMPRESSION: Patchy bilateral hazy airspace process worse over the left lower lobe with associated left basilar atelectasis. Findings are likely due to infection and less likely inflammatory process. Atherosclerotic coronary artery disease. Tubes and lines as described. Electronically Signed   By: Marin Olp M.D.   On: 11/24/2017 16:29   Dg Chest Port 1 View  Result Date: 11/25/2017 CLINICAL DATA:  62 year old male status post intubation. EXAM: PORTABLE CHEST 1 VIEW COMPARISON:  Chest x-ray 11/24/2017. FINDINGS: An endotracheal tube is in place with tip 5.0 cm above the carina. Nasogastric tube in place with tip curled back into the proximal stomach adjacent to the gastroesophageal junction. Lung volumes are slightly low. Consolidation and atelectasis in the medial aspect of the left lower lobe. Right lung appears clear. No pleural effusions. No evidence of pulmonary edema. No pneumothorax. Heart size is normal. Upper mediastinal contours are  within normal limits. IMPRESSION: 1. Support apparatus, as above. 2. Airspace consolidation and volume loss in the medial left lower lobe concerning for pneumonia. Followup PA and lateral chest X-ray is recommended in 3-4 weeks following trial of antibiotic therapy to ensure resolution and exclude underlying malignancy. Electronically Signed   By: Vinnie Langton M.D.   On: 11/25/2017 08:07    STUDIES:  MRI Cervical / Lumbar / Thoracic Spine >> no acute / traumatic finding, central disc herniation T7-8, T8-9 without neural compression, central to right sided disc herniation at T9-10 without likely neural compression, T10-11 central disc herniation with upward migration behind T10 with some indentation of the left side of the cord, ample subarachnoid space present dorsal to the cord, compression unlikely EEG 2/25 > several episodes of left temporal epileptiform activity, 1 sec burst suppression pattern. Echo 2/25 > EF 65-70%. EEG 2/26 >> abnormal due to significant diffuse slowing of the waking background MRI Brain 2/26 >> persistent abnormal signal layering in both occipital horns favored to be debris rather than blood, superimposed abnormal enhancement of the bilateral 7/8th cranial nerves, the basal turn  of the right cochlea, & possibly also the cisternal segment of the R 5th nerve, diffuse abnormal leptomeningeal enhancement of the lower thoracic spinal cord, conus and cauda equina  11/24/2017 CT of the head>> CULTURES: Urine 2/24 >> pseudomonas >> pan sensitive   BCx2 2/24 >> no growth to date LP 2/26 >> no growth to to date Crypto Ag 2/26 >> negative CSF AFB 2/27 >> no growth to date  ANTIBIOTICS: Zosyn 2/24 >> 2/25 Vanc 2/24 >> 2/27 Cefepime 2/26 >> 2/27 Ampicillin 2/26 >> 2/27 Acyclovir 2/26 >> 2/27 Unasyn 11/23/2017>>   SIGNIFICANT EVENTS: 2/14  Admit. 2/18  Neuro consult. 2/24  Evening episode of possible seizure, EEG with left temporal epileptiform activity  2/24   Intubated. 2/25  Episode of VT > brief code.  Transfer to [facility]  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  LINES/TUBES: ETT 2/24 >>   DISCUSSION: 62 y.o. M admitted to [facility] 2/14 with generalized weakness.  Initially felt to be due to hyponatremia but no change after Na corrected.  Seen by neurology who felt that this could represent GBS.  He was started on IVIG x 5 days but did not have significant improvement.  On 2/24, he required intubation.  He was then transferred to [facility] for further management.  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  Oncology evaluated on 11/24/2017 with poor prognosis and very little interventions available at this time.  ASSESSMENT / PLAN:  PULMONARY A: Respiratory insufficiency - due to inability to protect the airway in setting of possible GBS. Concern for aspiration vs HCAP. P:   PRVC 8 cc/kg  Wean PEEP / FiO2 for sats > 90% See ID  Follow CXR  Mental status remains barrier for extubation  Daily WUA / SBT as tolerated, not weanable due to neurological impairment   CARDIOVASCULAR A:  Sinus tach. VT Arrest - following intubation, resolved. Hx MI, HTN, HLD, CAD. P:  ICU monitoring  PRN lopressor  RENAL Recent Labs  Lab 11/23/17 0511 11/24/17 0429 11/25/17 0423  NA 141 143 141   Lab Results  Component Value Date   CREATININE 0.74 11/25/2017   CREATININE 0.74 11/24/2017   CREATININE 0.85 11/23/2017   Recent Labs  Lab 11/23/17 0511 11/24/17 0429 11/25/17 0423  K 4.2 4.8 4.5     A:   Hypophosphatemia - s/p repletion. Hyponatremia - resolved  P:   Trend BMP / urinary output Replace electrolytes as indicated Avoid nephrotoxic agents, ensure adequate renal  perfusion  GASTROINTESTINAL A:   Severe Protein Calorie Malnutrition / Weight Loss - reported 40lb weight loss in months prior to admit  Nutrition. Protein calorie malnutrition. GI prophylaxis. Diarrhea  P:   NPO / OGT  TF for nutrition  PPI for SUP    HEMATOLOGIC Recent Labs    11/23/17 0511 11/24/17 0429  HGB 8.1* 8.6*    A:   Anemia. VTE prophylaxis. P:  Trend CBC  SCD's + Lovenox for DVT prophylaxis   INFECTIOUS A:   Concern for Aspiration vs HCAP. Possible UTI. ID consult 2/27 P:   Abx per ID Trend PCT ordered new 2/28 Follow cultures, LP evaluation  ID consulted, appreciate input  Assess CSF AFB  ENDOCRINE CBG (last 3)  Recent Labs    11/24/17 2322 11/25/17 0304 11/25/17  [place]    A:   Hx DM. P:   SSI  Increase Levemir  Consider decreasing or stopping steroids   NEUROLOGIC A:   Acute Encephalopathy - metabolic, +/- anoxic, Neuro concerned for possible meningitis ,  note LP reveals shows malignant cells, presentation consistent with carcinomatous meningitis. Will need hemonc consult   Generalized weakness - question if his initial presentation was related to malnutrition / deconditioning in the setting of cancer  Sedation due to mechanical ventilation. Concern for GBS - started on IVIG x 5 days but did not have improvement.  IVIG was stopped once spinal fluid was positive for cancer.  Carcinomatosis meningeal. Seizures - EEG 2/25 with several episodes of left temporal epileptiform activity, 1 sec burst suppression pattern. P:   Sedation: PRN versed, fentanyl  RASS goal: 0  Minimize sedation as able  Neurology following, appreciate input  Follow serial neuro exam Continue keppra IVIG stopped. Question of palliative care appears to be consult at this time.  UROLOGY A: S/p TURP - had BCG therapy post surgery  P: Will need Urology follow up at discharge if he survives   FAMILY  - Updates.  11/25/2017 family's  been updated by oncology physician Dr. Benay Spice and they are aware of a poor prognosis.  Spoke with family at length 11/25/2017.     - Inter-disciplinary family meet or Palliative Care meeting due by:  Dec 19, 2017.  CC Time:  35 minutes   Richardson Landry Laurabelle Gorczyca ACNP Maryanna Shape PCCM Pager [phone] till 1 pm If no answer page [phone] 11/25/2017, 9:51 AM

## 2017-11-25 NOTE — Progress Notes (Signed)
MD notified of patient's temperature. Orders received. RN will continue to monitor.

## 2017-11-25 NOTE — Progress Notes (Signed)
IP PROGRESS NOTE  Subjective:   Raymond [name] remains on the ventilator.  His family is at the bedside.  The family and nurse report he has spontaneous movement of the extremities, but no purposeful movements.  Family reports Raymond [name] had 2 uncles with "bladder cancer "at an advanced age.  Objective: Vital signs in last 24 hours: Blood pressure 139/79, pulse 98, temperature (!) 100.7 F (38.2 C), temperature source Axillary, resp. rate (!) 23, height 6\' 1"  (1.854 m), weight 167 lb 15.9 oz (76.2 kg), SpO2 100 %.  Intake/Output from previous day: 02/28 0701 - 03/01 0700 In: 2380 [NG/GT:1770; IV Piggyback:610] Out: 2525 [Urine:2525]  Physical Exam:  HEENT: The pupils are equal and reactive, ETT in place Lungs: Clear anteriorly Cardiac: Regular rate and rhythm Abdomen: Soft, no mass Extremities: No leg edema Neurologic: Not responding to verbal or painful stimuli, spontaneously moves the right arm Lymph nodes: No left inguinal nodes  Portacath/PICC-without erythema  Lab Results: Recent Labs    11/23/17 0511 11/24/17 0429  WBC 13.1* 16.0*  HGB 8.1* 8.6*  HCT 25.8* 27.9*  PLT 219 236    BMET Recent Labs    11/24/17 0429 11/25/17 0423  NA 143 141  K 4.8 4.5  CL 109 106  CO2 26 25  GLUCOSE 290* 320*  BUN 39* 33*  CREATININE 0.74 0.74  CALCIUM 8.8* 8.8*     Studies/Results: Ct Head Wo Contrast  Result Date: 11/24/2017 CLINICAL DATA:  Altered level of consciousness. The patient has less responsive than yesterday. EXAM: CT HEAD WITHOUT CONTRAST TECHNIQUE: Contiguous axial images were obtained from the base of the skull through the vertex without intravenous contrast. COMPARISON:  Brain MRI 11/22/2017.  Head CT scan 03/25/2009. FINDINGS: Brain: Compared to the priors head CT scan, the ventricles are dilated diffusely consistent with communicating hydrocephalus. Small volume of debris layering dependently in the occipital horns lateral ventricles is better visualized on  the prior brain MRI. There is no midline shift, hemorrhage, acute infarction or mass. Vascular: No hyperdense vessel or unexpected calcification. Skull: Normal. Sinuses/Orbits: Negative. Other: None. IMPRESSION: Dilatation of the ventricular system compared to the 2010 CT scan consistent with communicating hydrocephalus. Debris layering within the lateral ventricles is better seen on comparison MRI. Electronically Signed   By: Inge Rise M.D.   On: 11/24/2017 09:07   Ct Chest W Contrast  Result Date: 11/24/2017 CLINICAL DATA:  Concern for Guillain-barre syndrome complicated by seizures and sepsis with acute respiratory failure. CPR performed outside hospital. History of bladder tumor. EXAM: CT CHEST WITH CONTRAST TECHNIQUE: Multidetector CT imaging of the chest was performed during intravenous contrast administration. CONTRAST:  12mL ISOVUE-300 IOPAMIDOL (ISOVUE-300) INJECTION 61% COMPARISON:  CT abdomen 01/05/2017 FINDINGS: Cardiovascular: Heart is normal size. Calcified plaque over the 3 vessel coronary arteries. Vascular structures are otherwise within normal. Mediastinum/Nodes: No mediastinal or hilar adenopathy. Lungs/Pleura: Tracheostomy tube in adequate position. Lungs are adequately inflated demonstrate mild bilateral patchy hazy airspace opacification worse in the left lower lobe likely with associated left basilar atelectasis. Findings are concerning for infection and less likely inflammatory process. Airways are otherwise within normal. Upper Abdomen: Nasogastric tube is present with tip over the gastric fundus. No acute findings. Musculoskeletal: Minimal degenerate change of the spine. IMPRESSION: Patchy bilateral hazy airspace process worse over the left lower lobe with associated left basilar atelectasis. Findings are likely due to infection and less likely inflammatory process. Atherosclerotic coronary artery disease. Tubes and lines as described. Electronically Signed   By: Quillian Quince  Derrel Nip  M.D.   On: 11/24/2017 16:29   Dg Chest Port 1 View  Result Date: 11/24/2017 CLINICAL DATA:  Respiratory failure.  Hypoxia. EXAM: PORTABLE CHEST 1 VIEW COMPARISON:  11/23/2017.  11/22/2017.  10/22/2015. FINDINGS: Endotracheal tube and NG tube in stable position. Mediastinum and hilar structures are normal. Mild left base atelectasis/infiltrate again noted. No interim change. No prominent pleural effusion or pneumothorax. Old right clavicular fracture. IMPRESSION: 1.  Lines and tubes in stable position. 2. Persistent left base atelectasis/infiltrate. Continued follow-up exam suggested to demonstrate clearing in order to exclude a underlying mass lesion. Electronically Signed   By: Marcello Moores  Register   On: 11/24/2017 07:36    Medications: I have reviewed the patient's current medications.  Assessment/Plan:  1.  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  Neurologic-altered mental status, leg weakness/numbness, and bowel/bladder dysfunction secondary to #1 3.  Seizure-maintained on Keppra 4.  Respiratory failure secondary to #1 and pneumonia 5.  History of "bladder cancer ", status post transurethral resection of bladder tumors in July and August 2018, high-grade urothelial carcinoma identified-suspicion for superficial invasion July 2019, negative pathology August 2018  6.  Diabetes 7.  Anorexia/weight loss secondary to #1 8.  Urinary tract infection 9.  Hydrocephalus  Raymond [name] appears unchanged.  The chest CT did not reveal evidence of a thoracic malignancy.  There is no apparent primary tumor site.  I reviewed CT and MRI images and radiology today.  There is no evidence of a primary tumor site or lymphoma.  The CNS images reveal hydrocephalus, debris in the ventricles, and meningeal enhancement at multiple sites.  He has been diagnosed with unknown primary carcinoma involving the central nervous system.  I reviewed the case with multiple of my oncology colleagues.  No  specific treatment is recommended.  I discussed the poor prognosis with his family.  They understand he will likely not survive the acute presentation.  I discussed the case with critical care medicine.  Recommendations: 1.  Palliative care consult 2.  Please call Oncology as needed      LOS: 12 days   Betsy Coder, MD   11/25/2017, 7:56 AM

## 2017-11-25 DEATH — deceased

## 2017-11-26 ENCOUNTER — Inpatient Hospital Stay (HOSPITAL_COMMUNITY): Payer: Federal, State, Local not specified - PPO

## 2017-11-26 DIAGNOSIS — Z7189 Other specified counseling: Secondary | ICD-10-CM

## 2017-11-26 DIAGNOSIS — C7949 Secondary malignant neoplasm of other parts of nervous system: Secondary | ICD-10-CM

## 2017-11-26 DIAGNOSIS — C801 Malignant (primary) neoplasm, unspecified: Secondary | ICD-10-CM

## 2017-11-26 LAB — GLUCOSE, CAPILLARY
GLUCOSE-CAPILLARY: 319 mg/dL — AB (ref 65–99)
GLUCOSE-CAPILLARY: 376 mg/dL — AB (ref 65–99)
GLUCOSE-CAPILLARY: 357 mg/dL — AB (ref 65–99)
GLUCOSE-CAPILLARY: 341 mg/dL — AB (ref 65–99)
GLUCOSE-CAPILLARY: 369 mg/dL — AB (ref 65–99)
Glucose-Capillary: 318 mg/dL — ABNORMAL HIGH (ref 65–99)
Glucose-Capillary: 379 mg/dL — ABNORMAL HIGH (ref 65–99)

## 2017-11-26 LAB — CSF CULTURE: CULTURE: NO GROWTH

## 2017-11-26 LAB — BASIC METABOLIC PANEL
ANION GAP: 9 (ref 5–15)
BUN: 38 mg/dL — AB (ref 6–20)
CHLORIDE: 109 mmol/L (ref 101–111)
CO2: 26 mmol/L (ref 22–32)
Calcium: 8.5 mg/dL — ABNORMAL LOW (ref 8.9–10.3)
Creatinine, Ser: 0.78 mg/dL (ref 0.61–1.24)
GFR calc Af Amer: >60 mL/min (ref >60)
GLUCOSE: 378 mg/dL — AB (ref 65–99)
POTASSIUM: 3.9 mmol/L (ref 3.5–5.1)
Sodium: 144 mmol/L (ref 135–145)

## 2017-11-26 LAB — CSF CULTURE W GRAM STAIN

## 2017-11-26 LAB — MAGNESIUM: Magnesium: 2.5 mg/dL — ABNORMAL HIGH (ref 1.7–2.4)

## 2017-11-26 LAB — PHOSPHORUS: Phosphorus: 3.6 mg/dL (ref 2.5–4.6)

## 2017-11-26 LAB — PROCALCITONIN: Procalcitonin: 0.1 ng/mL

## 2017-11-26 MED ORDER — INSULIN DETEMIR 100 UNIT/ML ~~LOC~~ SOLN
8.0000 [IU] | Freq: Once | SUBCUTANEOUS | Status: AC
  Administered 2017-11-26: 8 [IU] via SUBCUTANEOUS
  Filled 2017-11-26: qty 0.08

## 2017-11-26 MED ORDER — INSULIN DETEMIR 100 UNIT/ML ~~LOC~~ SOLN
30.0000 [IU] | Freq: Two times a day (BID) | SUBCUTANEOUS | Status: DC
  Administered 2017-11-27: 30 [IU] via SUBCUTANEOUS
  Filled 2017-11-26: qty 0.3

## 2017-11-26 NOTE — Progress Notes (Signed)
RT Note: Patient is unable to perform the NIF/VC tests due to neurological status. Rt will continue to monitor.

## 2017-11-26 NOTE — Progress Notes (Signed)
Daily Progress Note   Patient Name: Raymond [name]       Date: 11/26/2017 DOB: 13-Feb-1956  Age: 62 y.o. MRN#: [id number] Attending Physician: Rigoberto Noel, MD Primary Care Physician: Caren Macadam, MD Admit Date: 11/03/2017  Reason for Consultation/Follow-up: Establishing goals of care, Terminal Care and Withdrawal of life-sustaining treatment  Subjective: Patient seen, chart reviewed.  Had an appointment with patient's wife for 10 AM but at the time of this assessment she had not arrived to the unit.  Her mother and her brother are at the bedside.  These are to individuals that she wanted to allow time to come say their goodbyes to [employer] of Stay: 13  Current Medications: Scheduled Meds:  . acetaminophen (TYLENOL) oral liquid 160 mg/5 mL  650 mg Per Tube Q8H  . chlorhexidine gluconate (MEDLINE KIT)  15 mL Mouth Rinse BID  . insulin aspart  0-20 Units Subcutaneous Q4H  . insulin detemir  22 Units Subcutaneous BID  . mouth rinse  15 mL Mouth Rinse 10 times per day  . methylPREDNISolone (SOLU-MEDROL) injection  40 mg Intravenous Q12H  . multivitamin  15 mL Per Tube Daily  . pantoprazole sodium  40 mg Per Tube Daily    Continuous Infusions: . ampicillin-sulbactam (UNASYN) IV Stopped (11/26/17 0622)  . feeding supplement (VITAL AF 1.2 CAL) 1,000 mL (11/26/17 1000)  . levETIRAcetam 500 mg (11/26/17 0637)    PRN Meds: albuterol, bisacodyl, fentaNYL (SUBLIMAZE) injection, hydrALAZINE, metoprolol tartrate, midazolam, ondansetron **OR** ondansetron (ZOFRAN) IV, phenol  Physical Exam  Constitutional:  Acutely ill-appearing older man.  Intubated, unresponsive  Cardiovascular: Normal rate.  Pulmonary/Chest:  Intubated  Neurological:  Unresponsive to voice or noxious stimuli  Skin:  Skin is warm and dry. There is pallor.  Psychiatric:  No agitation otherwise unable to test  Nursing note and vitals reviewed.           Vital Signs: BP 102/70   Pulse (!) 112   Temp 100.1 F (37.8 C) (Axillary)   Resp (!) 24   Ht _0  (1.854 m)   Wt 74.6 kg (164 lb 7.4 oz)   SpO2 98%   BMI 21.70 kg/m  SpO2: SpO2: 98 % O2 Device: O2 Device: Ventilator O2 Flow Rate: O2 Flow Rate (L/min): 5 L/min  Intake/output summary:  Intake/Output Summary (Last 24 hours) at 11/26/2017 1101 Last data filed at 11/26/2017 1000 Gross per 24 hour  Intake 2125 ml  Output 2875 ml  Net -750 ml   LBM: Last BM Date: 11/24/17 Baseline Weight: Weight: 81.2 kg (179 lb 0.2 oz) Most recent weight: Weight: 74.6 kg (164 lb 7.4 oz)       Palliative Assessment/Data:    Flowsheet Rows     Most Recent Value  Intake Tab  Referral Department  Critical care  Unit at Time of Referral  ICU  Palliative Care Primary Diagnosis  Cancer  Date Notified  11/25/17  Reason for referral  Clarify Goals of Care, End of [place], Psychosocial or Spiritual support  Date of Admission  11/07/2017  Date first seen by Palliative Care  11/25/17  # of days Palliative referral response time  0 Day(s)  # of days IP prior to Palliative referral  15  Clinical Assessment  Palliative Performance Scale Score  20%  Pain Max last 24 hours  Not able to report  Pain Min Last 24 hours  Not able to report  Dyspnea Max Last 24 Hours  Not able to report  Dyspnea Min Last 24 hours  Not able to report  Nausea Max Last 24 Hours  Not able to report  Nausea Min Last 24 Hours  Not able to report  Anxiety Max Last 24 Hours  Not able to report  Anxiety Min Last 24 Hours  Not able to report  Other Max Last 24 Hours  Not able to report  Psychosocial & Spiritual Assessment  Palliative Care Outcomes  Patient/Family meeting held?  Yes  Who was at the meeting?  wife, son, dtr and pt's sister  Patient/Family wishes: Interventions  discontinued/not started   Hemodialysis, PEG, Trach, Vasopressors  Palliative Care follow-up planned  Yes, Facility      Patient Active Problem List   Diagnosis Date Noted  . Goals of care, counseling/discussion   . Palliative care by specialist   . Carcinomatous meningitis ([place]) 11/24/2017  . Aspiration pneumonia ([place]) 11/24/2017  . Unintentional weight loss   . History of bladder cancer   . Hypophosphatemia 11/22/2017  . Endotracheally intubated 11/22/2017  . Normocytic anemia 11/22/2017  . Acute respiratory failure with hypoxia ([place])   . Pressure injury of skin 11/19/2017  . Protein-calorie malnutrition, severe 11/12/2017  . Hyponatremia 10/28/2017  . Left inguinal hernia     Palliative Care Assessment & Plan   Patient Profile: 62 y.o. male  with past medical history of bladder cancer admitted on 11/15/2017 with weakness, hyponatremia, weight loss.  Patient was initially admitted to [facility] on 11/13/2017 with paralysis of lower extremities. He subsequently required intubation after a V. tach arrest.  Family describes a rapid functional decline beginning in January of increased weakness, inability to urinate, fatigue and pain.  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   Consult ordered for goals of care.  Plan at this point is to withdraw from the ventilator over the next 24-48 hours once family members have a chance to visit with patient   Recommendations/Plan:  Palliative medicine to stay involved and continue to work with wife and family when specifically to liberate patient from the vent  Continue treatment plan unchanged for now; continue antibiotics and tube feedings until withdrawal  Spouse is aware that she can request  withdrawal from the vent at any time; that ICU has resources to assist in his comfort during this  procedure  Goals of Care and Additional Recommendations:  Limitations on Scope of Treatment: Avoid Hospitalization, No Chemotherapy, No Hemodialysis, No Radiation, No Surgical Procedures and No Tracheostomy  Code Status:    Code Status Orders  (From admission, onward)        Start     Ordered   11/25/17 1636  Do not attempt resuscitation (DNR)  Continuous    Question Answer Comment  In the event of cardiac or respiratory ARREST Do not call a "code blue"   In the event of cardiac or respiratory ARREST Do not perform Intubation, CPR, defibrillation or ACLS   In the event of cardiac or respiratory ARREST Use medication by any route, position, wound care, and other measures to relive pain and suffering. May use oxygen, suction and manual treatment of airway obstruction as needed for comfort.      11/25/17 1635    Code Status History    Date Active Date Inactive Code Status Order ID Comments User Context   11/15/2017 22:09 11/25/2017 16:35 Full Code [id number]  Oswald Hillock, MD Inpatient    Advance Directive Documentation     Most Recent Value  Type of Advance Directive  Healthcare Power of Attorney, Living will  Pre-existing out of facility DNR order (yellow form or pink MOST form)  No data  "MOST" Form in Place?  No data       Prognosis:   Hours - Days point patient is liberated from the vent.  He would qualify for residential hospice  Discharge Planning:  To Be Determined    Thank you for allowing the Palliative Medicine Team to assist in the care of this patient.   Time In: 1000 Time Out: 1030 Total Time 30 min Prolonged Time Billed  no       Greater than 50%  of this time was spent counseling and coordinating care related to the above assessment and plan.  Dory Horn, NP  Please contact Palliative Medicine Team phone at [phone] for questions and concerns.

## 2017-11-26 NOTE — Progress Notes (Signed)
PULMONARY / CRITICAL CARE MEDICINE   Name: Raymond [name] MRN: [id number] DOB: Jan 19, 1956    ADMISSION DATE:  10/29/2017 CONSULTATION DATE:  11/21/17  REFERRING MD:  Dr. Dyann Kief  CHIEF COMPLAINT:  Respiratory failure  BRIEF SUMMARY:  62 year old male with past medical history of CAD, DMT2, HTN, HLD, MI, arthritis, anemia, and TURP (04/2017) secondary to bladder tumor admitted to [facility] on 2/14 with generalized weakness, pelvic pain, and numbness for 3 months.  He was seen in office on 2/14 by primary MD with reported 3 month hx of weight loss (40lbs) and functional decline.  He was referred for direct admit at [facility].    Initial presentation felt to be due to hyponatremia which was noted on admit, but symptoms persisted after sodium was corrected.  He had MRI that was negative. 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  Course complicated by sepsis from aspiration vs HCAP and UTI.  He intermittently required BiPAP.  On 2/25, he was on BiPAP and due to concern for further decline and increased risk of aspiration, he was subsequently intubated.  He suffered a brief VT arrest post intubation.  The patient was transferred to [facility] 2/25 for further evaluation and management. Cerebral spinal fluid with cancerous cells.  SUBJECTIVE:  Currently on full sedation.  Poor prognosis per oncology consult. VITAL SIGNS: BP 115/70   Pulse 99   Temp 100.1 F (37.8 C) (Axillary)   Resp 19   Ht 6\' 1"  (1.854 m)   Wt 164 lb 7.4 oz (74.6 kg)   SpO2 98%   BMI 21.70 kg/m   HEMODYNAMICS:    VENTILATOR SETTINGS: Vent Mode: PRVC FiO2 (%):  [30 %-40 %] 30 % Set Rate:  [14 bmp] 14 bmp Vt Set:  [550 mL] 550 mL PEEP:  [5 cmH20] 5 cmH20 Plateau Pressure:  [15 cmH20-18 cmH20] 15 cmH20  INTAKE / OUTPUT: I/O last 3 completed shifts: In: 3360 [NG/GT:2550; IV Piggyback:810] Out: 3925 [Urine:3925]  PHYSICAL EXAMINATION: General: Cachectic  male on full mechanical ventilatory support HEENT: Tracheal tube to ventilator, gastric tube with tube feedings PSY: None available Neuro: Some spontaneous movement right side CV: Heart sounds are regular PULM: Mild rhonchi bilaterally DT:OIZT, non-tender, bsx4 active  Extremities: warm/dry, negative edema  Skin: no rashes or lesions   LABS:  BMET Recent Labs  Lab 11/24/17 0429 11/25/17 0423 11/26/17 0239  NA 143 141 144  K 4.8 4.5 3.9  CL 109 106 109  CO2 26 25 26   BUN 39* 33* 38*  CREATININE 0.74 0.74 0.78  GLUCOSE 290* 320* 378*    Electrolytes Recent Labs  Lab 11/24/17 0429 11/24/17 1642 11/25/17 0423 11/26/17 0239  CALCIUM 8.8*  --  8.8* 8.5*  MG 2.1 2.1 2.1 2.5*  PHOS 3.0 3.0 3.1 3.6    CBC Recent Labs  Lab 11/22/17 1047 11/23/17 0511 11/24/17 0429  WBC 11.8* 13.1* 16.0*  HGB 10.0* 8.1* 8.6*  HCT 31.5* 25.8* 27.9*  PLT 314 219 236    Coag's No results for input(s): APTT, INR in the last 168 hours.  Sepsis Markers Recent Labs  Lab 11/24/17 0911 11/25/17 0423 11/26/17 0239  PROCALCITON 0.18 0.11 0.10    ABG Recent Labs  Lab 11/21/17 0433 11/21/17 1725 11/22/17 0116  PHART 7.511* 7.480* 7.485*  PCO2ART 34.6 38.2 38.7  PO2ART 98.3 114* 114*    Liver Enzymes Recent Labs  Lab 11/21/17  0440 11/22/17 1047  AST 83* 92*  ALT 42 60  ALKPHOS 36* 39  BILITOT 0.4 0.2*  ALBUMIN 2.0* 1.8*    Cardiac Enzymes Recent Labs  Lab 11/20/17 1414 11/20/17 2128 11/21/17 0211  TROPONINI 0.63* 0.96* 0.74*    Glucose Recent Labs  Lab 11/25/17 1514 11/25/17 1952 11/25/17 2350 11/26/17 0200 11/26/17 0353 11/26/17 0710  GLUCAP 328* 339* 291* 319* 376* 357*    Imaging Dg Chest Port 1 View  Result Date: 11/26/2017 CLINICAL DATA:  Respiratory failure EXAM: PORTABLE CHEST 1 VIEW COMPARISON:  11/25/2017 FINDINGS: Endotracheal tube tip 5.3 cm above the carina. Nasogastric tube coursing below the diaphragm projecting over the stomach. No  pleural effusion or pneumothorax. Mild hazy airspace disease in the medial left lower lobe likely reflecting atelectasis. Stable cardiomediastinal silhouette. No acute osseous abnormality. IMPRESSION: Support lines and tubing in satisfactory position. Mild left basilar atelectasis. Electronically Signed   By: Kathreen Devoid   On: 11/26/2017 07:57    STUDIES:  MRI Cervical / Lumbar / Thoracic Spine >> no acute / traumatic finding, central disc herniation T7-8, T8-9 without neural compression, central to right sided disc herniation at T9-10 without likely neural compression, T10-11 central disc herniation with upward migration behind T10 with some indentation of the left side of the cord, ample subarachnoid space present dorsal to the cord, compression unlikely EEG 2/25 > several episodes of left temporal epileptiform activity, 1 sec burst suppression pattern. Echo 2/25 > EF 65-70%. EEG 2/26 >> abnormal due to significant diffuse slowing of the waking background MRI Brain 2/26 >> persistent abnormal signal layering in both occipital horns favored to be debris rather than blood, superimposed abnormal enhancement of the bilateral 7/8th cranial nerves, the basal turn of the right cochlea, & possibly also the cisternal segment of the R 5th nerve, diffuse abnormal leptomeningeal enhancement of the lower thoracic spinal cord, conus and cauda equina  11/24/2017 CT of the head>> CULTURES: Urine 2/24 >> pseudomonas >> pan sensitive   BCx2 2/24 >> no growth to date LP 2/26 >> no growth to to date Crypto Ag 2/26 >> negative CSF AFB 2/27 >> no growth to date  ANTIBIOTICS: Zosyn 2/24 >> 2/25 Vanc 2/24 >> 2/27 Cefepime 2/26 >> 2/27 Ampicillin 2/26 >> 2/27 Acyclovir 2/26 >> 2/27 Unasyn 11/23/2017>>   SIGNIFICANT EVENTS: 2/14  Admit. 2/18  Neuro consult. 2/24  Evening episode of possible seizure, EEG with left temporal epileptiform activity  2/24  Intubated. 2/25  Episode of VT > brief code.  Transfer to [facility].   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  LINES/TUBES: ETT 2/24 >>   DISCUSSION: 62 y.o. M admitted to [facility] 2/14 with generalized weakness.  Initially felt to be due to hyponatremia but no change after Na corrected.  Seen by neurology who felt that this could represent GBS.  He was started on IVIG x 5 days but did not have significant improvement.  On 2/24, he required intubation.  He was then transferred to [facility] for further management.  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  Oncology evaluated on 11/24/2017 with poor prognosis and very little interventions available at this time.  ASSESSMENT / PLAN:  PULMONARY A: Respiratory insufficiency - due to inability to protect the airway in setting of possible GBS. Concern for  aspiration vs HCAP. P:   PRVC 8 cc/kg  Wean PEEP / FiO2 for sats > 90% See ID  Follow CXR  Mental status remains barrier for extubation  Daily WUA / SBT as tolerated, not weanable due to neurological impairment   CARDIOVASCULAR A:  Sinus tach. VT Arrest - following intubation, resolved. Hx MI, HTN, HLD, CAD. P:  ICU monitoring  PRN lopressor  RENAL Recent Labs  Lab 11/24/17 0429 11/25/17 0423 11/26/17 0239  NA 143 141 144   Lab Results  Component Value Date   CREATININE 0.78 11/26/2017   CREATININE 0.74 11/25/2017   CREATININE 0.74 11/24/2017   Recent Labs  Lab 11/24/17 0429 11/25/17 0423 11/26/17 0239  K 4.8 4.5 3.9     A:   Hypophosphatemia - s/p repletion. Hyponatremia - resolved  P:   Trend BMP / urinary output Replace electrolytes as indicated Avoid nephrotoxic agents, ensure adequate renal perfusion  GASTROINTESTINAL A:   Severe Protein Calorie Malnutrition / Weight  Loss - reported 40lb weight loss in months prior to admit  Nutrition. Protein calorie malnutrition. GI prophylaxis. Diarrhea  P:   NPO / OGT  TF for nutrition  PPI for SUP    HEMATOLOGIC Recent Labs    11/24/17 0429  HGB 8.6*    A:   Anemia. VTE prophylaxis. P:  Trend CBC  SCD's + Lovenox for DVT prophylaxis   INFECTIOUS A:   Concern for Aspiration vs HCAP. Possible UTI. ID consult 2/27 P:   Abx per ID Trend PCT ordered new 2/28 Follow cultures, LP evaluation  ID consulted, appreciate input  Assess CSF AFB  ENDOCRINE CBG (last 3)  Recent Labs    11/26/17 0200 11/26/17 0353 11/26/17 0710  GLUCAP 319* 376* 357*    A:   Hx DM. P:   SSI  Increase Levemir  Consider decreasing or stopping steroids   NEUROLOGIC A:   Acute Encephalopathy - metabolic, +/- anoxic, Neuro concerned for possible meningitis ,  note LP reveals shows malignant cells, presentation consistent with carcinomatous meningitis. Will need hemonc consult   Generalized weakness - question if his initial presentation was related to malnutrition / deconditioning in the setting of cancer  Sedation due to mechanical ventilation. Concern for GBS - started on IVIG x 5 days but did not have improvement.  IVIG was stopped once spinal fluid was positive for cancer.  Carcinomatosis meningeal. Seizures - EEG 2/25 with several episodes of left temporal epileptiform activity, 1 sec burst suppression pattern. P:   Sedation: PRN versed, fentanyl  RASS goal: 0  Minimize sedation as able  Neurology following, appreciate input  Follow serial neuro exam Continue keppra IVIG stopped. Question of palliative care appears to be consult at this time.  UROLOGY A: S/p TURP - had BCG therapy post surgery  P: Will need Urology follow up at discharge if he survives   FAMILY  - Updates.  11/25/2017 family's been updated by oncology physician Dr. Benay Spice and they are aware of a poor prognosis.    -  Inter-disciplinary family meet or Palliative Care meeting due by:  12-23-2017.     page [phone] 11/26/2017, 8:35 AM

## 2017-11-26 NOTE — Progress Notes (Signed)
Family notified RN that they will be ready to extubate patient tomorrow, 12/17/2017 in the afternoon after church. CCMD made aware. Patient transferred to [id number]. Family is aware.

## 2017-11-27 DIAGNOSIS — Z515 Encounter for palliative care: Secondary | ICD-10-CM

## 2017-11-27 LAB — COMPREHENSIVE METABOLIC PANEL
ALBUMIN: 2 g/dL — AB (ref 3.5–5.0)
ALT: 91 U/L — ABNORMAL HIGH (ref 17–63)
AST: 112 U/L — AB (ref 15–41)
Alkaline Phosphatase: 54 U/L (ref 38–126)
Anion gap: 15 (ref 5–15)
BUN: 63 mg/dL — AB (ref 6–20)
CHLORIDE: 113 mmol/L — AB (ref 101–111)
CO2: 19 mmol/L — AB (ref 22–32)
CREATININE: 1.06 mg/dL (ref 0.61–1.24)
Calcium: 8.3 mg/dL — ABNORMAL LOW (ref 8.9–10.3)
GFR calc non Af Amer: >60 mL/min (ref >60)
GLUCOSE: 414 mg/dL — AB (ref 65–99)
Potassium: 5.9 mmol/L — ABNORMAL HIGH (ref 3.5–5.1)
SODIUM: 147 mmol/L — AB (ref 135–145)
Total Bilirubin: 2.8 mg/dL — ABNORMAL HIGH (ref 0.3–1.2)
Total Protein: 6.5 g/dL (ref 6.5–8.1)

## 2017-11-27 LAB — CULTURE, BLOOD (ROUTINE X 2)
CULTURE: NO GROWTH
CULTURE: NO GROWTH

## 2017-11-27 LAB — GLUCOSE, CAPILLARY
GLUCOSE-CAPILLARY: 377 mg/dL — AB (ref 65–99)
GLUCOSE-CAPILLARY: 384 mg/dL — AB (ref 65–99)
GLUCOSE-CAPILLARY: 391 mg/dL — AB (ref 65–99)
Glucose-Capillary: 413 mg/dL — ABNORMAL HIGH (ref 65–99)

## 2017-11-27 LAB — MAGNESIUM: MAGNESIUM: 2.7 mg/dL — AB (ref 1.7–2.4)

## 2017-11-27 MED ORDER — BIOTENE DRY MOUTH MT LIQD: 15.0000 mL | OROMUCOSAL | Status: DC | PRN

## 2017-11-27 MED ORDER — MORPHINE BOLUS VIA INFUSION
2.0000 mg | INTRAVENOUS | Status: DC | PRN
  Filled 2017-11-27: qty 2

## 2017-11-27 MED ORDER — ONDANSETRON HCL 4 MG/2ML IJ SOLN: 4.0000 mg | Freq: Four times a day (QID) | INTRAMUSCULAR | Status: DC | PRN

## 2017-11-27 MED ORDER — HALOPERIDOL LACTATE 2 MG/ML PO CONC
0.5000 mg | ORAL | Status: DC | PRN
  Filled 2017-11-27: qty 0.3

## 2017-11-27 MED ORDER — GLYCOPYRROLATE 0.2 MG/ML IJ SOLN: 0.2000 mg | INTRAMUSCULAR | Status: DC | PRN

## 2017-11-27 MED ORDER — ACETAMINOPHEN 325 MG PO TABS: 650.0000 mg | ORAL_TABLET | Freq: Four times a day (QID) | ORAL | Status: DC | PRN

## 2017-11-27 MED ORDER — ACETAMINOPHEN 325 MG PO TABS: 650.0000 mg | ORAL_TABLET | ORAL | Status: DC | PRN

## 2017-11-27 MED ORDER — ACETAMINOPHEN 650 MG RE SUPP
650.0000 mg | Freq: Four times a day (QID) | RECTAL | Status: DC | PRN
Start: 2017-11-27 — End: 2017-11-27

## 2017-11-27 MED ORDER — HALOPERIDOL 0.5 MG PO TABS: 0.5000 mg | ORAL_TABLET | ORAL | Status: DC | PRN

## 2017-11-27 MED ORDER — HALOPERIDOL LACTATE 5 MG/ML IJ SOLN: 0.5000 mg | INTRAMUSCULAR | Status: DC | PRN

## 2017-11-27 MED ORDER — ONDANSETRON 4 MG PO TBDP
4.0000 mg | ORAL_TABLET | Freq: Four times a day (QID) | ORAL | Status: DC | PRN
  Filled 2017-11-27: qty 1

## 2017-11-27 MED ORDER — POLYVINYL ALCOHOL 1.4 % OP SOLN
1.0000 [drp] | Freq: Four times a day (QID) | OPHTHALMIC | Status: DC | PRN
  Filled 2017-11-27: qty 15

## 2017-11-27 MED ORDER — ATROPINE SULFATE 1 % OP SOLN
2.0000 [drp] | Freq: Four times a day (QID) | OPHTHALMIC | Status: DC
  Administered 2017-11-27: 2 [drp] via SUBLINGUAL
  Filled 2017-11-27: qty 2

## 2017-11-27 MED ORDER — GLYCOPYRROLATE 1 MG PO TABS: 1.0000 mg | ORAL_TABLET | ORAL | Status: DC | PRN

## 2017-11-27 MED ORDER — SODIUM CHLORIDE 0.9 % IV SOLN
5.0000 mg/h | INTRAVENOUS | Status: DC
  Administered 2017-11-27: 5 mg/h via INTRAVENOUS
  Filled 2017-11-27: qty 10

## 2017-11-27 MED ORDER — MORPHINE 100MG IN NS 100ML (1MG/ML) PREMIX INFUSION: 5.0000 mg/h | INTRAVENOUS | Status: DC

## 2017-11-28 LAB — MTB NAA WITHOUT AFB CULTURE: M Tuberculosis, Naa: NEGATIVE

## 2017-11-29 LAB — MISC LABCORP TEST (SEND OUT): Labcorp test code: 183506

## 2017-11-30 LAB — HEAVY METALS PROFILE, URINE
ARSENIC (TOTAL),U: 12 ug/L (ref 0–50)
Arsenic(Inorganic),U: NOT DETECTED ug/L (ref 0–19)
CREATININE(CRT), U: 0.68 g/L (ref 0.30–3.00)
Lead, Rand Ur: NOT DETECTED ug/L (ref 0–49)
MERCURY UR: NOT DETECTED ug/L (ref 0–19)

## 2017-12-01 ENCOUNTER — Telehealth: Payer: Self-pay

## 2017-12-01 NOTE — Telephone Encounter (Signed)
On 12/01/17 I received a d/c from [facility] (original). The d/c is for burial. The patient is a patient of Doctor Nelda Marseille. The d/c will be taken to [facility] for signature.  On 12/05/17 I received the d/c back from Doctor Nelda Marseille. I got the d/c ready and called the funeral home to let them know the d/c was mailed to vital records per the funeral home request.

## 2017-12-14 LAB — MISCELLANEOUS TEST: Miscellaneous Test: 94415

## 2017-12-15 ENCOUNTER — Ambulatory Visit: Payer: Medicare Other | Admitting: Family Medicine

## 2017-12-26 NOTE — Progress Notes (Signed)
Post mortem care completed, lines removed, [place] [facility] contacted

## 2017-12-26 NOTE — Procedures (Signed)
Extubation Procedure Note  Patient Details:   Name: Raymond [name] DOB: 16-Feb-1956 MRN: [id number]   Airway Documentation:  Airway (Active)    Evaluation  O2 sats: currently acceptable Complications: No apparent complications Patient did tolerate procedure well. Bilateral Breath Sounds: Diminished    Patient extubated to comfort measures without incident.   [name] 12/25/17, 1:42 PM

## 2017-12-26 NOTE — Progress Notes (Signed)
267mL Morphine wasted in sink, witnessed by Rip Harbour, Scientist, research (life sciences)

## 2017-12-26 NOTE — Death Summary Note (Signed)
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[place]) Active Problems:   Hyponatremia   Protein-calorie malnutrition, severe   Pressure injury of skin   Hypophosphatemia   Endotracheally intubated   Normocytic anemia   Acute respiratory failure with hypoxia ([place])   Aspiration pneumonia ([place])   Unintentional weight loss   History of bladder cancer   Goals of care, counseling/discussion   Palliative care by specialist   Brief Hospital Course (including significant findings, care, treatment, and services provided and events leading to death)  62 year old male with past medical history of CAD, DMT2, HTN, HLD, MI, arthritis, anemia, and TURP (04/2017) secondary to bladder tumor admitted to [facility] on 12/05/2022 with generalized weakness, pelvic pain, and numbness for 3 months.  He was seen in office on 2022-12-05 by primary MD with reported 3 month hx of weight loss (40lbs) and functional decline.  He was referred for direct admit at [facility].    Initial presentation felt to be due to hyponatremia which was noted on admit, but symptoms persisted after sodium was corrected.  He had MRI that was negative. 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  Course complicated by sepsis from aspiration vs HCAP and UTI.  He intermittently required BiPAP.  On 2/25, he was on BiPAP and due to concern for further decline and increased risk of  aspiration, he was subsequently intubated.  He suffered a brief VT arrest post intubation.  The patient was transferred to [facility] 2/25 for further evaluation and management. Cerebral spinal fluid with cancerous cells.  After a prolonged hospitalization family reported that patient would notwant this level of care and patient was made DNR, comfort care and expired.  Pertinent Labs and Studies  Significant Diagnostic Studies Ct Head Wo Contrast  Result Date: 11/24/2017 CLINICAL DATA:  Altered level of consciousness. The patient has less responsive than yesterday. EXAM: CT HEAD WITHOUT CONTRAST TECHNIQUE: Contiguous axial images were obtained from the base of the skull through the vertex without intravenous contrast. COMPARISON:  Brain MRI 11/22/2017.  Head CT scan 03/25/2009. FINDINGS: Brain: Compared to the priors head CT scan, the ventricles are dilated diffusely consistent with communicating hydrocephalus. Small volume of debris layering dependently in the occipital horns lateral ventricles is better visualized on the prior brain MRI. There is no midline shift, hemorrhage, acute infarction or mass. Vascular: No hyperdense vessel or unexpected calcification. Skull: Normal. Sinuses/Orbits: Negative. Other: None. IMPRESSION: Dilatation of the ventricular system compared to the 2010 CT scan consistent with communicating hydrocephalus. Debris layering within the lateral ventricles is better seen on comparison MRI. Electronically Signed   By: Inge Rise M.D.   On: 11/24/2017 09:07   Ct Chest W Contrast  Result Date: 11/24/2017 CLINICAL DATA:  Concern for Guillain-barre syndrome complicated by seizures and sepsis with acute respiratory failure. CPR performed outside hospital. History of bladder tumor. EXAM: CT CHEST WITH CONTRAST TECHNIQUE: Multidetector CT imaging of the chest was  performed during intravenous contrast administration. CONTRAST:  55mL ISOVUE-300 IOPAMIDOL (ISOVUE-300) INJECTION 61%  COMPARISON:  CT abdomen 01/05/2017 FINDINGS: Cardiovascular: Heart is normal size. Calcified plaque over the 3 vessel coronary arteries. Vascular structures are otherwise within normal. Mediastinum/Nodes: No mediastinal or hilar adenopathy. Lungs/Pleura: Tracheostomy tube in adequate position. Lungs are adequately inflated demonstrate mild bilateral patchy hazy airspace opacification worse in the left lower lobe likely with associated left basilar atelectasis. Findings are concerning for infection and less likely inflammatory process. Airways are otherwise within normal. Upper Abdomen: Nasogastric tube is present with tip over the gastric fundus. No acute findings. Musculoskeletal: Minimal degenerate change of the spine. IMPRESSION: Patchy bilateral hazy airspace process worse over the left lower lobe with associated left basilar atelectasis. Findings are likely due to infection and less likely inflammatory process. Atherosclerotic coronary artery disease. Tubes and lines as described. Electronically Signed   By: Marin Olp M.D.   On: 11/24/2017 16:29   Mr Jodene Nam Head Wo Contrast  Result Date: 11/15/2017 CLINICAL DATA:  Multiple falls recently with back pain. EXAM: MRI HEAD WITHOUT CONTRAST MRA HEAD WITHOUT CONTRAST TECHNIQUE: Multiplanar, multiecho pulse sequences of the brain and surrounding structures were obtained without intravenous contrast. Angiographic images of the head were obtained using MRA technique without contrast. COMPARISON:  Head CT 03/25/2009 FINDINGS: MRI HEAD FINDINGS Brain: Diffusion imaging does not show any acute or subacute infarction. The brain shows generalized atrophy, progressive since 2010. The study does not show a pattern of advanced small vessel ischemic change, with only a few small foci within the hemispheric white matter. There is some layering material dependent within the ventricles, probably some old blood products related to the falls. The differential diagnosis would be  layering debris secondary to meningitis. No evidence of mass lesion. No extra-axial collection. Vascular: Major vessels at the base of the brain show flow. Skull and upper cervical spine: Negative Sinuses/Orbits: Clear/normal Other: None MRA HEAD FINDINGS Both internal carotid arteries are patent through the skull base and siphon regions. On the right, this vessel supplies only the middle cerebral artery. No MCA stenosis or occlusion. On the left, this vessel supplies the left middle cerebral artery and both anterior cerebral arteries. No stenosis, occlusion, aneurysm or high flow vascular malformation. Both vertebral arteries are widely patent to the basilar. No basilar stenosis. Posterior circulation branch vessels appear normal. IMPRESSION: Generalized brain atrophy, progressive since the CT scan of 2010. There are only minimal small vessel changes of the hemispheric white matter. Some layering material in the occipital horns of lateral ventricles, probably blood products related to recent falls described clinically. Differential diagnosis does include debris secondary to meningitis, but that seems less likely. Negative intracranial MR angiography of the large and medium size vessels. 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 Electronically Signed   By: Nelson Chimes M.D.   On: 11/15/2017 08:32   Mr Brain Wo Contrast  Result Date: 11/15/2017 CLINICAL DATA:  Multiple falls recently with back pain. EXAM: MRI HEAD WITHOUT CONTRAST MRA HEAD WITHOUT CONTRAST TECHNIQUE: Multiplanar, multiecho pulse sequences of the brain and surrounding structures were obtained without intravenous contrast. Angiographic images of the head were obtained using MRA technique without contrast. COMPARISON:  Head CT 03/25/2009 FINDINGS: MRI HEAD FINDINGS Brain: Diffusion imaging does not show any acute or subacute infarction. The brain shows generalized atrophy,  progressive since 2010. The study does not show a pattern of advanced small vessel ischemic change, with only a  few small foci within the hemispheric white matter. There is some layering material dependent within the ventricles, probably some old blood products related to the falls. The differential diagnosis would be layering debris secondary to meningitis. No evidence of mass lesion. No extra-axial collection. Vascular: Major vessels at the base of the brain show flow. Skull and upper cervical spine: Negative Sinuses/Orbits: Clear/normal Other: None MRA HEAD FINDINGS Both internal carotid arteries are patent through the skull base and siphon regions. On the right, this vessel supplies only the middle cerebral artery. No MCA stenosis or occlusion. On the left, this vessel supplies the left middle cerebral artery and both anterior cerebral arteries. No stenosis, occlusion, aneurysm or high flow vascular malformation. Both vertebral arteries are widely patent to the basilar. No basilar stenosis. Posterior circulation branch vessels appear normal. IMPRESSION: Generalized brain atrophy, progressive since the CT scan of 2010. There are only minimal small vessel changes of the hemispheric white matter. Some layering material in the occipital horns of lateral ventricles, probably blood products related to recent falls described clinically. Differential diagnosis does include debris secondary to meningitis, but that seems less likely. Negative intracranial MR angiography of the large and medium size vessels. 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 Electronically Signed   By: Nelson Chimes M.D.   On: 11/15/2017 08:32   Mr Jeri Cos TF Contrast  Result Date: 11/22/2017 CLINICAL DATA:  62 year old male with encephalopathy, seizure activity, unresponsive. EXAM: MRI HEAD WITHOUT AND WITH CONTRAST TECHNIQUE: Multiplanar, multiecho pulse sequences of the  brain and surrounding structures were obtained without and with intravenous contrast. CONTRAST:  63mL MULTIHANCE GADOBENATE DIMEGLUMINE 529 MG/ML IV SOLN COMPARISON:  [facility] brain MRI with intracranial MRA a 11/15/2017 FINDINGS: Brain: No restricted diffusion or evidence of acute infarction. However, there is persistent layering debris in the bilateral occipital horns which does demonstrate abnormal diffusion (series 4, image 20) but no T2* signal loss. Associated occipital horn periventricular T2 and FLAIR hyperintensity also appears mildly progressed since 11/15/2017. No other intraventricular debris identified. Following contrast, no definite abnormal ependymal or subependymal enhancement is identified. No increased periventricular FLAIR signal abnormality elsewhere, although there are several nonspecific foci of temporal horn periventricular FLAIR hyperintensity such as series 8, image 10 on the right. There is possible hyperdynamic flow at the cerebral aqueduct. No superimposed extra-axial collection or extra-axial diffusion restriction. No pachymeningeal thickening or enhancement. However, there is questionable abnormal enhancement of the leptomeninges within the bilateral internal auditory canals (series 13, images 10 and 11). This does not appear associated with discrete IAC soft tissue masses. However, the basilar turn of the right cochlea may also be abnormally enhancing (series 13, image 10). The other visible internal auditory structures appear grossly normal. Questionable also hyperenhancement of the cisternal right 5th nerve on series 14, image 11. No additional cranial nerve enhancement identified. Hippocampal complex signal and morphology appears symmetric and within normal limits. Mild for age scattered nonspecific parenchymal white matter T2 and FLAIR hyperintensity is stable. No other new parenchymal signal abnormality and abnormal parenchymal enhancement. Basilar cisterns appear  normal. No midline shift, mass effect, evidence of mass lesion or acute intracranial hemorrhage. Cervicomedullary junction and pituitary are within normal limits. Vascular: Major intracranial vascular flow voids are preserved and stable. Skull and upper cervical spine: Negative visible cervical spine. Normal bone marrow signal. Sinuses/Orbits: Normal orbits soft tissues aside from mild Disconjugate gaze. Paranasal Visualized paranasal sinuses and mastoids are stable and  well pneumatized. Other: Scalp and face soft tissues appear negative. IMPRESSION: 1. Persistent abnormal signal layering in both occipital horns favored to be debris (see #2,3) rather than blood products. No associated intraventricular/subependymal enhancement. 2. Superimposed abnormal enhancement of the bilateral 7/8th cranial nerves, the basal turn of the right cochlea, and possibly also the cisternal segment of the right 5th nerve. This appears to be abnormal leptomeningeal type enhancement rather than due to cranial nerve masses. And although there is not widespread intracranial leptomeningeal abnormality I do note diffuse abnormal leptomeningeal enhancement of the lower thoracic spinal cord, conus, and cauda equina on 11/11/2017. 3. Recommend CSF analysis. This constellation of findings could reflect leptomeningeal infection, noninfectious inflammation, or less likely carcinomatosis. Electronically Signed   By: Genevie Ann M.D.   On: 11/22/2017 13:58   Mr Cervical Spine Wo Contrast  Result Date: 11/15/2017 CLINICAL DATA:  Back pain beginning 2 months ago. Multiple recent falls. EXAM: MRI CERVICAL AND thoracic SPINE WITHOUT CONTRAST TECHNIQUE: Multiplanar and multiecho pulse sequences of the cervical spine, to include the craniocervical junction and cervicothoracic junction, and lumbar spine, were obtained without intravenous contrast. COMPARISON:  None. FINDINGS: MRI CERVICAL SPINE FINDINGS Alignment: Normal Vertebrae: No fracture or primary  bone lesion. Cord: To cord compression or primary cord lesion. Posterior Fossa, vertebral arteries, paraspinal tissues: Negative Disc levels: No abnormality at the foramen magnum, C1-2 or C2-3. C3-4: Right-sided predominant spondylosis. Mild to moderate right foraminal narrowing. No compressive central canal stenosis. C4-5 through C7-T1: Normal. MRI thoracic SPINE FINDINGS Alignment:  Normal Vertebrae: No fracture or primary bone lesion. Ordinary small endplate Schmorl's nodes in the midthoracic region. Cord: No evidence of primary cord lesion. No evidence of abnormal cord T2 signal. Ample subarachnoid space present dorsal to the cord. See below. Paraspinal and other soft tissues: Negative Disc levels: No abnormality at T6-7 or above. T7-8: Central disc herniation effaces the ventral subarachnoid space and contacts the ventral cord. No foraminal extension. No cord compression. T8-9: Small central disc herniation indents the ventral subarachnoid space and contacts the ventral cord. No foraminal extension. No cord compression. T9-10: Right paracentral disc herniation effaces the ventral subarachnoid space on the right and contacts the right ventral cord. No foraminal extension. No cord compression. T10-11: Central disc herniation with upward migration of disc material behind the T10 vertebral body. Effacement of the ventral subarachnoid space with indentation of the cord more on the left. Ample subarachnoid space present dorsal to the cord. No foraminal extension. T11-12: Normal. T12-L1: Normal. IMPRESSION: Cervical spine: No acute or traumatic finding. Right-sided spondylosis at C3-4 with mild to moderate right foraminal narrowing. Thoracic spine: No acute or traumatic finding. Central disc herniations at T7-8 and T8-9 without likely neural compression. Central to right-sided disc herniation at T9-10 without likely neural compression. These could certainly be associated with back pain. T10-11 central disc herniation  with upward migration behind T10 with some indentation of the left side of the cord. Ample subarachnoid space present dorsal to the cord, therefore cord compression is unlikely. This could also be associated with back pain however. Electronically Signed   By: Nelson Chimes M.D.   On: 11/15/2017 09:05   Mr Thoracic Spine Wo Contrast  Result Date: 11/15/2017 CLINICAL DATA:  Back pain beginning 2 months ago. Multiple recent falls. EXAM: MRI CERVICAL AND thoracic SPINE WITHOUT CONTRAST TECHNIQUE: Multiplanar and multiecho pulse sequences of the cervical spine, to include the craniocervical junction and cervicothoracic junction, and lumbar spine, were obtained without intravenous contrast. COMPARISON:  None. FINDINGS: MRI CERVICAL SPINE FINDINGS Alignment: Normal Vertebrae: No fracture or primary bone lesion. Cord: To cord compression or primary cord lesion. Posterior Fossa, vertebral arteries, paraspinal tissues: Negative Disc levels: No abnormality at the foramen magnum, C1-2 or C2-3. C3-4: Right-sided predominant spondylosis. Mild to moderate right foraminal narrowing. No compressive central canal stenosis. C4-5 through C7-T1: Normal. MRI thoracic SPINE FINDINGS Alignment:  Normal Vertebrae: No fracture or primary bone lesion. Ordinary small endplate Schmorl's nodes in the midthoracic region. Cord: No evidence of primary cord lesion. No evidence of abnormal cord T2 signal. Ample subarachnoid space present dorsal to the cord. See below. Paraspinal and other soft tissues: Negative Disc levels: No abnormality at T6-7 or above. T7-8: Central disc herniation effaces the ventral subarachnoid space and contacts the ventral cord. No foraminal extension. No cord compression. T8-9: Small central disc herniation indents the ventral subarachnoid space and contacts the ventral cord. No foraminal extension. No cord compression. T9-10: Right paracentral disc herniation effaces the ventral subarachnoid space on the right and  contacts the right ventral cord. No foraminal extension. No cord compression. T10-11: Central disc herniation with upward migration of disc material behind the T10 vertebral body. Effacement of the ventral subarachnoid space with indentation of the cord more on the left. Ample subarachnoid space present dorsal to the cord. No foraminal extension. T11-12: Normal. T12-L1: Normal. IMPRESSION: Cervical spine: No acute or traumatic finding. Right-sided spondylosis at C3-4 with mild to moderate right foraminal narrowing. Thoracic spine: No acute or traumatic finding. Central disc herniations at T7-8 and T8-9 without likely neural compression. Central to right-sided disc herniation at T9-10 without likely neural compression. These could certainly be associated with back pain. T10-11 central disc herniation with upward migration behind T10 with some indentation of the left side of the cord. Ample subarachnoid space present dorsal to the cord, therefore cord compression is unlikely. This could also be associated with back pain however. Electronically Signed   By: Nelson Chimes M.D.   On: 11/15/2017 09:05   Mr Lumbar Spine W Wo Contrast  Result Date: 11/11/2017 CLINICAL DATA:  Back pain.  Cauda equina syndrome suspected. EXAM: MRI LUMBAR SPINE WITHOUT AND WITH CONTRAST TECHNIQUE: Multiplanar and multiecho pulse sequences of the lumbar spine were obtained without and with intravenous contrast. CONTRAST:  16 mL MultiHance. COMPARISON:  CT abdomen and pelvis 11/23/2017 FINDINGS: Segmentation: 5 non rib-bearing lumbar type vertebral bodies are present. Alignment:  AP alignment is anatomic. Vertebrae:  Marrow signal and vertebral body heights are normal. Conus medullaris and cauda equina: Conus extends to the L1-2 level. Conus and cauda equina appear normal. Paraspinal and other soft tissues: Limited imaging of the abdomen is unremarkable. There is no significant adenopathy. Disc levels: L1-2: Negative. L2-3: Negative. L3-4:  Mild facet hypertrophy is present bilaterally. No significant disc protrusion or stenosis is present. L4-5: A right paramedian disc protrusion and annular tear is present. There is potential impact on the traversing right L5 nerve roots. The foramina are patent. L5-S1: A shallow central disc protrusion and annular tear is present without focal stenosis. The foramina are patent. IMPRESSION: 1. Right paramedian disc protrusion and annular tear at L4-5 with potential impact on the traversing right L5 nerve roots. 2. Shallow central disc protrusion and annular tear at L5-S1 without focal stenosis. Electronically Signed   By: San Morelle M.D.   On: 11/11/2017 12:04   Ct Abdomen Pelvis W Contrast  Result Date: 11/07/2017 CLINICAL DATA:  Lower pelvic pain, diagnosed with bladder cancer 2  months ago, undergoing intravesicular treatments, history coronary artery disease post MI, diabetes mellitus, hypertension EXAM: CT ABDOMEN AND PELVIS WITH CONTRAST TECHNIQUE: Multidetector CT imaging of the abdomen and pelvis was performed using the standard protocol following bolus administration of intravenous contrast. Sagittal and coronal MPR images reconstructed from axial data set. CONTRAST:  166mL ISOVUE-300 IOPAMIDOL (ISOVUE-300) INJECTION 61% IV. No oral contrast. COMPARISON:  01/05/2017 FINDINGS: Lower chest: Lung bases clear Hepatobiliary: Gallbladder and liver normal appearance Pancreas: Normal appearance Spleen: Normal appearance Adrenals/Urinary Tract: Minimal chronic thickening of LEFT adrenal gland without discrete mass. Kidneys and ureters normal appearance. Foley catheter decompresses urinary bladder. Bladder mass identified on the previous exam not visualized. Stomach/Bowel: Appendix surgically absent by history. Stomach and bowel loops normal appearance Vascular/Lymphatic: Atherosclerotic calcifications aorta and coronary arteries without aortic aneurysm. Few normal sized to mildly enlarged lymph nodes at  the LEFT inguinal region. Reproductive: Mild prostatic enlargement gland measuring 5.9 x 4.1 x 3.2 cm. Seminal vesicles unremarkable. Other: Infiltrative changes at LEFT inguinal region, by history inguinal herniorrhaphy 08/15/2017. Small focal fluid collection at LEFT inguinal hernia repair 21 x 9 mm image 83 question seroma or lymphocele, infection considered less likely this far removed from surgery though not completely excluded. No free intraperitoneal air or fluid. Musculoskeletal: Unremarkable IMPRESSION: Prostatic enlargement. No acute intra-abdominal or intrapelvic abnormalities. Postoperative changes of LEFT inguinal herniorrhaphy with reactive lymph nodes and a 21 x 9 mm fluid collection at the surgical bed favor seroma versus lymphocele as discussed above. Aortic Atherosclerosis (ICD10-I70.0). Electronically Signed   By: Lavonia Dana M.D.   On: 11/11/2017 16:55   Dg Chest Port 1 View  Result Date: 11/26/2017 CLINICAL DATA:  Respiratory failure EXAM: PORTABLE CHEST 1 VIEW COMPARISON:  11/25/2017 FINDINGS: Endotracheal tube tip 5.3 cm above the carina. Nasogastric tube coursing below the diaphragm projecting over the stomach. No pleural effusion or pneumothorax. Mild hazy airspace disease in the medial left lower lobe likely reflecting atelectasis. Stable cardiomediastinal silhouette. No acute osseous abnormality. IMPRESSION: Support lines and tubing in satisfactory position. Mild left basilar atelectasis. Electronically Signed   By: Kathreen Devoid   On: 11/26/2017 07:57   Dg Chest Port 1 View  Result Date: 11/25/2017 CLINICAL DATA:  62 year old male status post intubation. EXAM: PORTABLE CHEST 1 VIEW COMPARISON:  Chest x-ray 11/24/2017. FINDINGS: An endotracheal tube is in place with tip 5.0 cm above the carina. Nasogastric tube in place with tip curled back into the proximal stomach adjacent to the gastroesophageal junction. Lung volumes are slightly low. Consolidation and atelectasis in the medial  aspect of the left lower lobe. Right lung appears clear. No pleural effusions. No evidence of pulmonary edema. No pneumothorax. Heart size is normal. Upper mediastinal contours are within normal limits. IMPRESSION: 1. Support apparatus, as above. 2. Airspace consolidation and volume loss in the medial left lower lobe concerning for pneumonia. Followup PA and lateral chest X-ray is recommended in 3-4 weeks following trial of antibiotic therapy to ensure resolution and exclude underlying malignancy. Electronically Signed   By: Vinnie Langton M.D.   On: 11/25/2017 08:07   Dg Chest Port 1 View  Result Date: 11/24/2017 CLINICAL DATA:  Respiratory failure.  Hypoxia. EXAM: PORTABLE CHEST 1 VIEW COMPARISON:  11/23/2017.  11/22/2017.  10/22/2015. FINDINGS: Endotracheal tube and NG tube in stable position. Mediastinum and hilar structures are normal. Mild left base atelectasis/infiltrate again noted. No interim change. No prominent pleural effusion or pneumothorax. Old right clavicular fracture. IMPRESSION: 1.  Lines and tubes in stable  position. 2. Persistent left base atelectasis/infiltrate. Continued follow-up exam suggested to demonstrate clearing in order to exclude a underlying mass lesion. Electronically Signed   By: Marcello Moores  Register   On: 11/24/2017 07:36   Dg Chest Port 1 View  Result Date: 11/23/2017 CLINICAL DATA:  62 year old male with respiratory failure and intubated after aspiration. Unresponsive. EXAM: PORTABLE CHEST 1 VIEW COMPARISON:  11/22/2017 and earlier. FINDINGS: Portable AP semi upright view at 0607 hours. Endotracheal tube tip in good position at the level the clavicles. Enteric tube loops in the proximal stomach. Mediastinal contours remain normal. Stable lung volumes. Continued Patchy and confluent retrocardiac opacity. No other confluent opacity. No pneumothorax, pulmonary edema or pleural effusion. Paucity bowel gas in the upper abdomen. Stable right clavicle deformity. IMPRESSION: 1.   Stable lines and tubes. 2. Persistent patchy retrocardiac opacity compatible with aspiration and/or pneumonia in this clinical setting. No pleural effusion is evident. 3. No new cardiopulmonary abnormality. Electronically Signed   By: Genevie Ann M.D.   On: 11/23/2017 10:16   Dg Chest Port 1 View  Result Date: 11/22/2017 CLINICAL DATA:  Respiratory failure, history of hypertension, diabetes, and previous MI. Nonsmoker. EXAM: PORTABLE CHEST 1 VIEW COMPARISON:  Portable chest x-ray of November 21, 2017 FINDINGS: The lungs are well-expanded. There is persistent atelectasis or pneumonia at the left lung base. The right lung is clear. The heart and pulmonary vascularity are normal. The endotracheal tube tip projects 4.7 cm above the carina. The esophagogastric tube tip and proximal port project below the inferior margin of the image. IMPRESSION: Persistent left lower lobe atelectasis or pneumonia. The support tubes are in reasonable position. Electronically Signed   By: David  Martinique M.D.   On: 11/22/2017 08:50   Portable Chest Xray  Result Date: 11/21/2017 CLINICAL DATA:  Nasogastric tube placement. EXAM: PORTABLE CHEST 1 VIEW COMPARISON:  Abdominal radiograph November 20, 2016 FINDINGS: Nasogastric tube tip projects over the spine. Endotracheal tube tip projects 4.2 cm above the carina. Cardiomediastinal silhouette is normal. Calcified aortic arch. Patchy LEFT greater than RIGHT lower lobe airspace opacities without pleural effusion. No pneumothorax. Soft tissue planes and included osseous structures are unchanged. Old RIGHT clavicle fracture. IMPRESSION: Nasogastric tube tip projecting in distal stomach. Stable position of endotracheal tube. Patchy bibasilar airspace opacities concerning for pneumonia. Electronically Signed   By: Elon Alas M.D.   On: 11/21/2017 03:45   Portable Chest X-ray  Result Date: 11/20/2017 CLINICAL DATA:  Intubated EXAM: PORTABLE CHEST 1 VIEW COMPARISON:  Chest radiograph from  earlier today. FINDINGS: Endotracheal tube tip is 4.5 cm above the carina. Enteric tube terminates in the lower thoracic esophagus. Stable cardiomediastinal silhouette with normal heart size. No pneumothorax. No pleural effusion. Patchy opacities at both lung bases, left greater than right, not appreciably changed. IMPRESSION: 1. Well-positioned endotracheal tube. 2. Enteric tube terminates in the lower thoracic esophagus and should be advanced 10 cm. 3. Stable patchy bibasilar lung opacities, left greater than right, suspicious for pneumonia. These results were called by telephone at the time of interpretation on 11/20/2017 at 12:27 pm to [place], who verbally acknowledged these results. Electronically Signed   By: Ilona Sorrel M.D.   On: 11/20/2017 12:28   Dg Chest Port 1 View  Result Date: 11/20/2017 CLINICAL DATA:  62 year old male with altered mental status and sepsis. EXAM: PORTABLE CHEST 1 VIEW COMPARISON:  Chest radiograph dated 10/22/2015 FINDINGS: Left lung base atelectatic changes. Infiltrate is not excluded. Clinical correlation is recommended the right lung is  clear. No pleural effusion or pneumothorax. The cardiac silhouette is within normal limits. No acute osseous pathology. IMPRESSION: Left lung base atelectasis. Infiltrate is not excluded. Clinical correlation is recommended. Electronically Signed   By: Anner Crete M.D.   On: 11/20/2017 02:04   Dg Abd Portable 1v  Result Date: 11/22/2017 CLINICAL DATA:  OG tube placement EXAM: PORTABLE ABDOMEN - 1 VIEW COMPARISON:  CT 11/14/2017 FINDINGS: Esophageal tube tip projects over the proximal stomach. Visible gas pattern is unremarkable IMPRESSION: Esophageal tube tip overlies the proximal stomach. Electronically Signed   By: Donavan Foil M.D.   On: 11/22/2017 20:51    Microbiology Recent Results (from the past 240 hour(s))  MRSA PCR Screening     Status: None   Collection Time: 11/18/17 11:40 PM  Result Value Ref Range  Status   MRSA by PCR NEGATIVE NEGATIVE Final    Comment:        The GeneXpert MRSA Assay (FDA approved for NASAL specimens only), is one component of a comprehensive MRSA colonization surveillance program. It is not intended to diagnose MRSA infection nor to guide or monitor treatment for MRSA infections. Performed at [facility], [address] [place], [place] [address]   Culture, blood (x 2)     Status: None   Collection Time: 11/20/17  1:55 AM  Result Value Ref Range Status   Specimen Description BLOOD LEFT ARM  Final   Special Requests   Final    BOTTLES DRAWN AEROBIC AND ANAEROBIC Blood Culture adequate volume   Culture   Final    NO GROWTH 5 DAYS Performed at [facility], [address] [place], [place] [address]    Report Status 11/25/2017 FINAL  Final  Culture, blood (x 2)     Status: None   Collection Time: 11/20/17  2:10 AM  Result Value Ref Range Status   Specimen Description BLOOD RIGHT ARM  Final   Special Requests   Final    BOTTLES DRAWN AEROBIC AND ANAEROBIC Blood Culture adequate volume DRAWN BY RN   Culture   Final    NO GROWTH 5 DAYS Performed at [facility], [address] [place], [place] [address]    Report Status 11/25/2017 FINAL  Final  Culture, Urine     Status: Abnormal   Collection Time: 11/20/17  4:39 AM  Result Value Ref Range Status   Specimen Description   Final    URINE, CATHETERIZED Performed at [facility], [address] [place], [place] [address]    Special Requests   Final    NONE Performed at [facility], [address] [place], [place] [address]    Culture >=100,000 COLONIES/mL PSEUDOMONAS AERUGINOSA (A)  Final   Report Status 11/22/2017 FINAL  Final   Organism ID, Bacteria PSEUDOMONAS AERUGINOSA (A)  Final      Susceptibility   Pseudomonas aeruginosa - MIC*    CEFTAZIDIME 4 SENSITIVE Sensitive     CIPROFLOXACIN <=0.25 SENSITIVE Sensitive     GENTAMICIN <=1 SENSITIVE Sensitive     IMIPENEM 1 SENSITIVE Sensitive      PIP/TAZO 8 SENSITIVE Sensitive     CEFEPIME 2 SENSITIVE Sensitive     * >=100,000 COLONIES/mL PSEUDOMONAS AERUGINOSA  Culture, Urine     Status: Abnormal   Collection Time: 11/22/17  2:21 PM  Result Value Ref Range Status   Specimen Description URINE, CATHETERIZED  Final   Special Requests   Final    NONE Performed at [facility], [address] [place], [place] [address]    Culture 80,000 COLONIES/mL PSEUDOMONAS AERUGINOSA (A)  Final   Report Status 11/24/2017 FINAL  Final   Organism ID, Bacteria PSEUDOMONAS AERUGINOSA (A)  Final      Susceptibility   Pseudomonas aeruginosa - MIC*    CEFTAZIDIME 4 SENSITIVE Sensitive     CIPROFLOXACIN <=0.25 SENSITIVE Sensitive     GENTAMICIN <=1 SENSITIVE Sensitive     IMIPENEM 1 SENSITIVE Sensitive     PIP/TAZO 8 SENSITIVE Sensitive     CEFEPIME 2 SENSITIVE Sensitive     * 80,000 COLONIES/mL PSEUDOMONAS AERUGINOSA  Culture, blood (Routine X 2) w Reflex to ID Panel     Status: None   Collection Time: 11/22/17  2:30 PM  Result Value Ref Range Status   Specimen Description BLOOD LEFT HAND  Final   Special Requests   Final    BOTTLES DRAWN AEROBIC AND ANAEROBIC Blood Culture results may not be optimal due to an excessive volume of blood received in culture bottles   Culture   Final    NO GROWTH 5 DAYS Performed at [place] [address] [place], [place] [address]    Report Status 02-Dec-2017 FINAL  Final  Culture, blood (Routine X 2) w Reflex to ID Panel     Status: None   Collection Time: 11/22/17  2:32 PM  Result Value Ref Range Status   Specimen Description BLOOD RIGHT WRIST  Final   Special Requests   Final    BOTTLES DRAWN AEROBIC AND ANAEROBIC Blood Culture results may not be optimal due to an excessive volume of blood received in culture bottles   Culture   Final    NO GROWTH 5 DAYS Performed at [facility], [place] [address] [place], [place] [address]    Report Status 02-Dec-2017 FINAL  Final  Culture,  respiratory (NON-Expectorated)     Status: None   Collection Time: 11/22/17  3:26 PM  Result Value Ref Range Status   Specimen Description TRACHEAL ASPIRATE  Final   Special Requests NONE  Final   Gram Stain   Final    ABUNDANT WBC PRESENT,BOTH PMN AND MONONUCLEAR RARE YEAST RARE GRAM VARIABLE ROD Performed at [facility], [place] [address] [place], [place] [address]    Culture FEW CANDIDA ALBICANS  Final   Report Status 11/24/2017 FINAL  Final  CSF culture     Status: None   Collection Time: 11/22/17  4:16 PM  Result Value Ref Range Status   Specimen Description CSF  Final   Special Requests NONE  Final   Gram Stain   Final    WBC PRESENT, PREDOMINANTLY MONONUCLEAR NO ORGANISMS SEEN CYTOSPIN SMEAR    Culture   Final    NO GROWTH 3 DAYS Performed at [facility], [place] [address] [place], [place] [address]    Report Status 11/26/2017 FINAL  Final  C difficile quick scan w PCR reflex     Status: None   Collection Time: 11/23/17 11:20 AM  Result Value Ref Range Status   C Diff antigen NEGATIVE NEGATIVE Final   C Diff toxin NEGATIVE NEGATIVE Final   C Diff interpretation No C. difficile detected.  Final  Acid Fast Smear (AFB)     Status: None   Collection Time: 11/23/17  7:33 PM  Result Value Ref Range Status   AFB Specimen Processing [id number]  Final    Comment: (NOTE) The specimen submitted does not  meet the laboratory's criteria for acceptability. Refer to [employer] of Services for specimen acceptability criteria.      Rejected because rec'd Green Whole Blood and      unable to perform [phone] on whole blood.      Notified Abby B 11/25/2017    Acid Fast Smear NOT PERFORMED  Final    Comment: (NOTE) Test not performed Performed At: [facility] [place], [place] [id number] Rush Farmer MD DP:[id number]    Source (AFB) WB  Final    Comment: Performed at [facility], [place] [address], [place], [place] [address]     Lab Basic Metabolic Panel: Recent Labs  Lab 11/23/17 [phone] 11/23/17 1933 11/24/17 0429 11/24/17 1642 11/25/17 0423 11/26/17 0239 11/29/17 0255  NA 141  --  143  --  141 144 147*  K 4.2  --  4.8  --  4.5 3.9 5.9*  CL 107  --  109  --  106 109 113*  CO2 25  --  26  --  25 26 19*  GLUCOSE 294*  --  290*  --  320* 378* 414*  BUN 51*  --  39*  --  33* 38* 63*  CREATININE 0.85  --  0.74  --  0.74 0.78 1.06  CALCIUM 8.6*  --  8.8*  --  8.8* 8.5* 8.3*  MG 2.3 2.2 2.1 2.1 2.1 2.5* 2.7*  PHOS 3.5 3.5 3.0 3.0 3.1 3.6  --    Liver Function Tests: Recent Labs  Lab 11/22/17 1047 29-Nov-2017 0255  AST 92* 112*  ALT 60 91*  ALKPHOS 39 54  BILITOT 0.2* 2.8*  PROT 8.0 6.5  ALBUMIN 1.8* 2.0*   No results for input(s): LIPASE, AMYLASE in the last 168 hours. No results for input(s): AMMONIA in the last 168 hours. CBC: Recent Labs  Lab 11/22/17 0418 11/22/17 1047 11/23/17 0511 11/24/17 0429  WBC 12.6* 11.8* 13.1* 16.0*  HGB 9.6* 10.0* 8.1* 8.6*  HCT 30.4* 31.5* 25.8* 27.9*  MCV 90.2 91.0 92.8 92.1  PLT 325 314 219 236   Cardiac Enzymes: Recent Labs  Lab 11/23/17 1933  CKTOTAL 564*   Sepsis Labs: Recent Labs  Lab 11/22/17 0418 11/22/17 1047 11/23/17 0511 11/24/17 0429 11/24/17 0911 11/25/17 0423 11/26/17 0239  PROCALCITON  --   --   --   --  0.18 0.11 0.10  WBC 12.6* 11.8* 13.1* 16.0*  --   --   --     Procedures/Operations     Tayvin Preslar 11/28/2017, 7:39 PM

## 2017-12-26 NOTE — Progress Notes (Signed)
Palliative Care Follow-up Family continue to arrive, patient moved to 29M. CCM has discussed extubation and comfort transition with family. Palliative will support as needed. Will ask for spiritual care to check in on family as well.  Lane Hacker, DO Palliative Medicine

## 2017-12-26 NOTE — Progress Notes (Signed)
Patient unable to perform NIF/VC.

## 2017-12-26 NOTE — Progress Notes (Addendum)
PULMONARY / CRITICAL CARE MEDICINE   Name: Raymond [name] MRN: [id number] DOB: Mar 23, 1956    ADMISSION DATE:  11/06/2017 CONSULTATION DATE:  11/21/17  REFERRING MD:  Dr. Dyann Kief  CHIEF COMPLAINT:  Respiratory failure  BRIEF SUMMARY:  62 year old male with past medical history of CAD, DMT2, HTN, HLD, MI, arthritis, anemia, and TURP (04/2017) secondary to bladder tumor admitted to [facility] on 2/14 with generalized weakness, pelvic pain, and numbness for 3 months.  He was seen in office on 2/14 by primary MD with reported 3 month hx of weight loss (40lbs) and functional decline.  He was referred for direct admit at [facility].    Initial presentation felt to be due to hyponatremia which was noted on admit, but symptoms persisted after sodium was corrected.  He had MRI that was negative. 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  Course complicated by sepsis from aspiration vs HCAP and UTI.  He intermittently required BiPAP.  On 2/25, he was on BiPAP and due to concern for further decline and increased risk of aspiration, he was subsequently intubated.  He suffered a brief VT arrest post intubation.  The patient was transferred to [facility] 2/25 for further evaluation and management. Cerebral spinal fluid with cancerous cells.  SUBJECTIVE:  Mains unresponsive on ventilator  VITAL SIGNS: BP (!) 86/68   Pulse (!) 118   Temp 99.2 F (37.3 C) (Oral)   Resp (!) 22   Ht 6\' 1"  (1.854 m)   Wt 162 lb 11.2 oz (73.8 kg)   SpO2 100%   BMI 21.47 kg/m    HEMODYNAMICS:    VENTILATOR SETTINGS: Vent Mode: PRVC FiO2 (%):  [30 %] 30 % Set Rate:  [14 bmp] 14 bmp Vt Set:  [550 mL] 550 mL PEEP:  [5 cmH20] 5 cmH20 Plateau Pressure:  [16 cmH20-19 cmH20] 18 cmH20  INTAKE / OUTPUT: I/O last 3 completed shifts: In: 3050 [NG/GT:2550; IV Piggyback:500] Out: 2725 [Urine:2725]  PHYSICAL EXAMINATION:  General: 62 year old white male currently  unresponsive on ventilator HEENT: Orally intubated no jugular venous distention Pulmonary: Scattered rhonchi equal chest rise Cardiac: Regular rate and rhythm Abdomen: Soft nontender Remedies: Warm and dry Neuro: Minimally responsive  LABS:  BMET Recent Labs  Lab 11/25/17 0423 11/26/17 0239 12/06/17 0255  NA 141 144 147*  K 4.5 3.9 5.9*  CL 106 109 113*  CO2 25 26 19*  BUN 33* 38* 63*  CREATININE 0.74 0.78 1.06  GLUCOSE 320* 378* 414*    Electrolytes Recent Labs  Lab 11/24/17 1642 11/25/17 0423 11/26/17 0239 06-Dec-2017 0255  CALCIUM  --  8.8* 8.5* 8.3*  MG 2.1 2.1 2.5* 2.7*  PHOS 3.0 3.1 3.6  --     CBC Recent Labs  Lab 11/22/17 1047 11/23/17 0511 11/24/17 0429  WBC 11.8* 13.1* 16.0*  HGB 10.0* 8.1* 8.6*  HCT 31.5* 25.8* 27.9*  PLT 314 219 236    Coag's No results for input(s): APTT, INR in the last 168 hours.  Sepsis Markers Recent Labs  Lab 11/24/17 0911 11/25/17 0423 11/26/17 0239  PROCALCITON 0.18 0.11 0.10    ABG Recent Labs  Lab 11/21/17 0433 11/21/17 1725 11/22/17 0116  PHART 7.511* 7.480* 7.485*  PCO2ART 34.6 38.2 38.7  PO2ART 98.3 114* 114*    Liver Enzymes Recent Labs  Lab 11/21/17 0440 11/22/17 1047 December 06, 2017 0255  AST 83* 92* 112*  ALT 42 60 91*  ALKPHOS 36* 39 54  BILITOT 0.4 0.2* 2.8*  ALBUMIN 2.0* 1.8* 2.0*    Cardiac Enzymes Recent Labs  Lab 11/20/17 1414 11/20/17 2128 11/21/17 0211  TROPONINI 0.63* 0.96* 0.74*    Glucose Recent Labs  Lab 11/26/17 1556 11/26/17 2015 11/26/17 2130 Dec 16, 2017 0020 Dec 16, 2017 0348 December 16, 2017 0756  GLUCAP 318* 379* 369* 391* 377* 384*    Imaging No results found.  STUDIES:  MRI Cervical / Lumbar / Thoracic Spine >> no acute / traumatic finding, central disc herniation T7-8, T8-9 without neural compression, central to right sided disc herniation at T9-10 without likely neural compression, T10-11 central disc herniation with upward migration behind T10 with some indentation  of the left side of the cord, ample subarachnoid space present dorsal to the cord, compression unlikely EEG 2/25 > several episodes of left temporal epileptiform activity, 1 sec burst suppression pattern. Echo 2/25 > EF 65-70%. EEG 2/26 >> abnormal due to significant diffuse slowing of the waking background MRI Brain 2/26 >> persistent abnormal signal layering in both occipital horns favored to be debris rather than blood, superimposed abnormal enhancement of the bilateral 7/8th cranial nerves, the basal turn of the right cochlea, & possibly also the cisternal segment of the R 5th nerve, diffuse abnormal leptomeningeal enhancement of the lower thoracic spinal cord, conus and cauda equina  11/24/2017 CT of the head>> CULTURES: Urine 2/24 >> pseudomonas >> pan sensitive   BCx2 2/24 >> no growth to date LP 2/26 >> no growth to to date Crypto Ag 2/26 >> negative CSF AFB 2/27 >> no growth to date  ANTIBIOTICS: Zosyn 2/24 >> 2/25 Vanc 2/24 >> 2/27 Cefepime 2/26 >> 2/27 Ampicillin 2/26 >> 2/27 Acyclovir 2/26 >> 2/27 Unasyn 11/23/2017>>   SIGNIFICANT EVENTS: 2/14  Admit. 2/18  Neuro consult. 2/24  Evening episode of possible seizure, EEG with left temporal epileptiform activity  2/24  Intubated. 2/25  Episode of VT > brief code.  Transfer to [facility].  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 Respiratory insufficiency - due to inability to protect the airway in setting of possible GBS. Concern for aspiration vs HCAP. VT Arrest - following intubation, resolved. Hx MI, HTN, HLD, CAD. Sinus tach. PSEUDOMONAS uti Hypophosphatemia - s/p repletion. Hyponatremia - resolved  Severe Protein Calorie Malnutrition / Weight Loss - reported 40lb weight loss in months prior to admit  Anemia. Hx DM. S/p TURP - had BCG therapy post surgery   DISCUSSION: 62 y.o. M admitted to [facility] 2/14 with generalized weakness.  Initially felt to be due to hyponatremia but no change after Na corrected.  Seen by neurology who felt that this could represent GBS.  He was started on IVIG x 5 days but did not have significant improvement.  On 2/24, he required intubation.  He was then transferred to [facility] for further management.  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  Oncology evaluated on 11/24/2017 with  poor prognosis and very little interventions available at this time.  Family is now prepared for withdrawal of care with transition to full comfort care   Plan Start morphine drip DC antibiotics Continue Keppra Discontinue all therapies that do not contribute to comfort No further lab work Extubate when all family present   Erick Colace ACNP-BC [place] Pager # [phone] OR # [phone] if no answer      page [phone] 12-04-2017, 10:53 AM  Attending Note:  62 year old male with CNS carcinomatosis and respiratory failure who suffered severe encephalopathy and now is deteriorating.  On exam, patient is unresponsive and not following commands.  I reviewed CXR myself, ETT is in good position.  Discussed with PCCM-NP and palliative care.  Will start morphine drip per family discussion and extubate once the rest of the family is here to comfort care.  The patient is critically ill with multiple organ systems  failure and requires high complexity decision making for assessment and support, frequent evaluation and titration of therapies, application of advanced monitoring technologies and extensive interpretation of multiple databases.   Critical Care Time devoted to patient care services described in this note is  35  Minutes. This time reflects time of care of this signee Dr Jennet Maduro. This critical care time does not reflect procedure time, or teaching time or supervisory time of PA/NP/Med student/Med Resident etc but could involve care discussion time.  Rush Farmer, M.D. [facility] Pulmonary/Critical Care Medicine. Pager: [phone]. After hours pager: [phone].

## 2017-12-26 DEATH — deceased

## 2018-01-05 LAB — ACID FAST CULTURE WITH REFLEXED SENSITIVITIES (MYCOBACTERIA): Acid Fast Culture: NEGATIVE

## 2018-01-07 LAB — ACID FAST CULTURE WITH REFLEXED SENSITIVITIES (MYCOBACTERIA): Acid Fast Culture: NEGATIVE

## 2018-01-07 LAB — ACID FAST CULTURE WITH REFLEXED SENSITIVITIES

## 2018-10-03 IMAGING — CT CT HEAD W/O CM
4 series · 15 of 47 positions shown, 17 images · non-contrast
Comparison: Brain MRI 11/22/2017.  Head CT scan 03/25/2009.

CLINICAL DATA: Altered level of consciousness. The patient has less
responsive than yesterday.

EXAM:
CT HEAD WITHOUT CONTRAST
TECHNIQUE: Contiguous axial images were obtained from the base of the skull
through the vertex without intravenous contrast.

[Series 3: head without · axial · non-contrast · 0.47mm/px · z∈[-112,+8]mm · 7 of 33 slices shown, 9 images]
[im 5/33  brain]
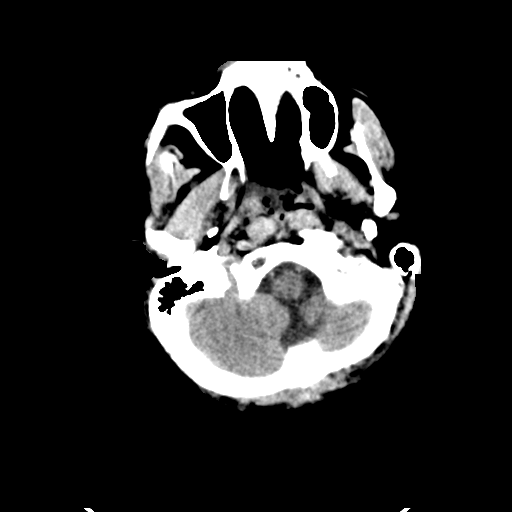
[im 5/33  bone]
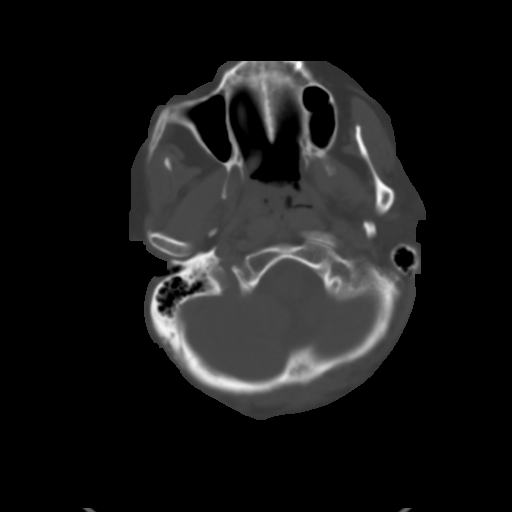
[im 9/33  brain]
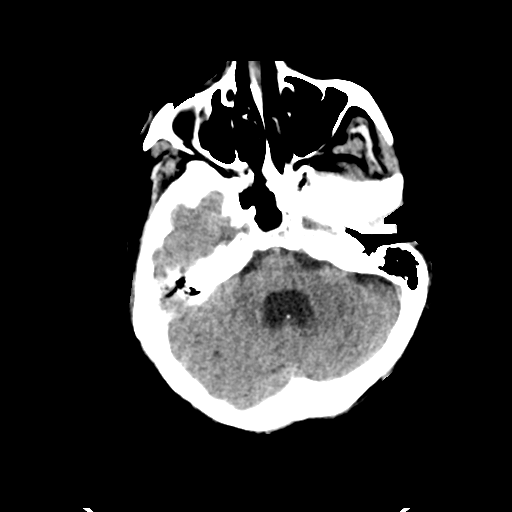
[im 13/33  brain]
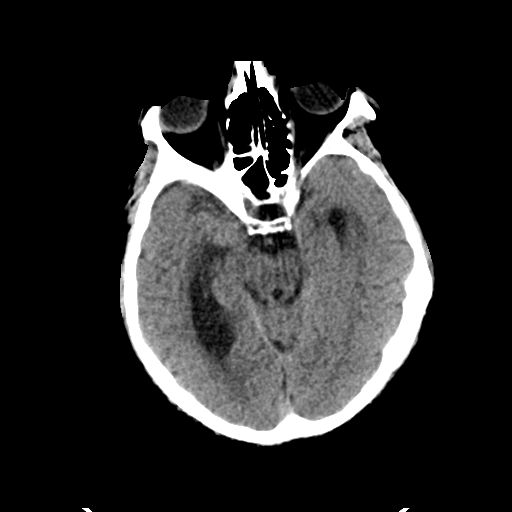
[im 17/33  brain]
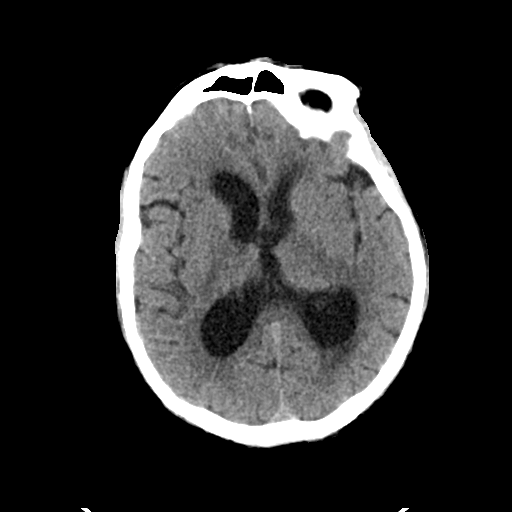
[im 21/33  brain]
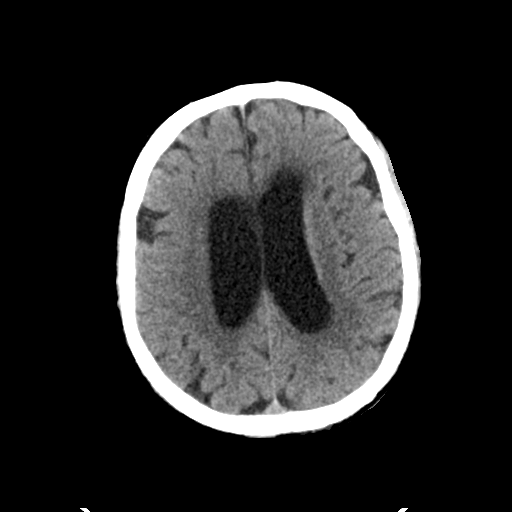
[im 21/33  bone]
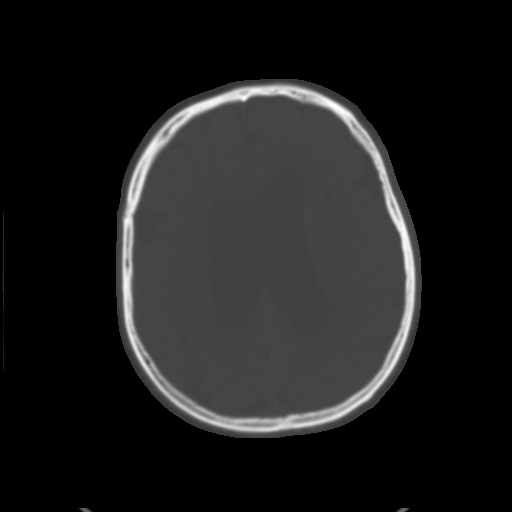
[im 25/33  brain]
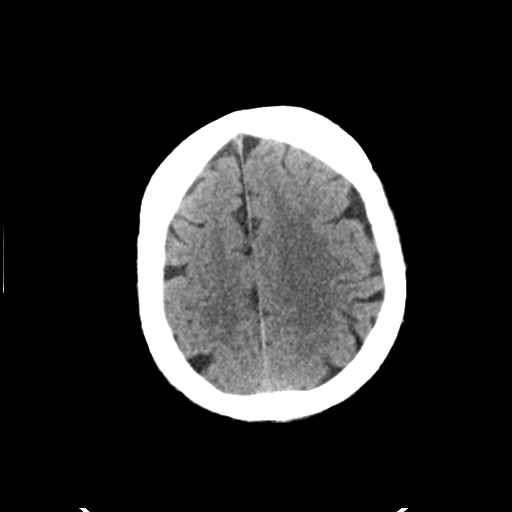
[im 29/33  brain]
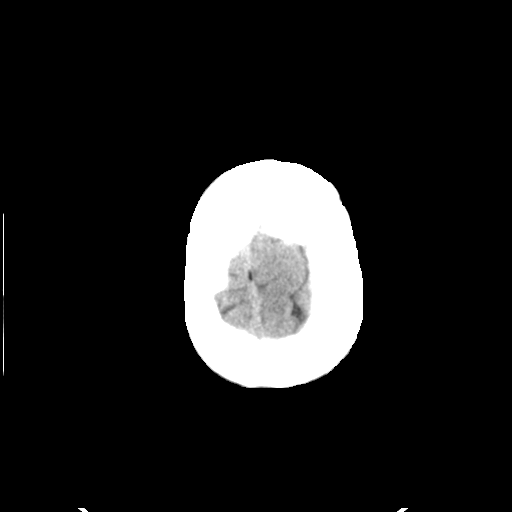

[Series 4: head bone · axial · 0.47mm/px · z∈[-116,-100]mm · 2 of 83 slices shown]
[im 9/83  bone]
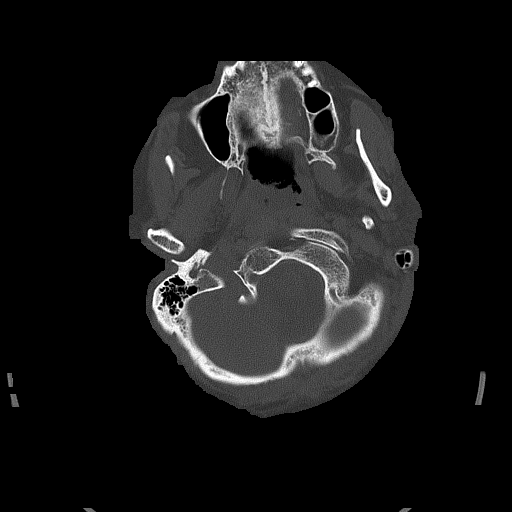
[im 17/83  bone]
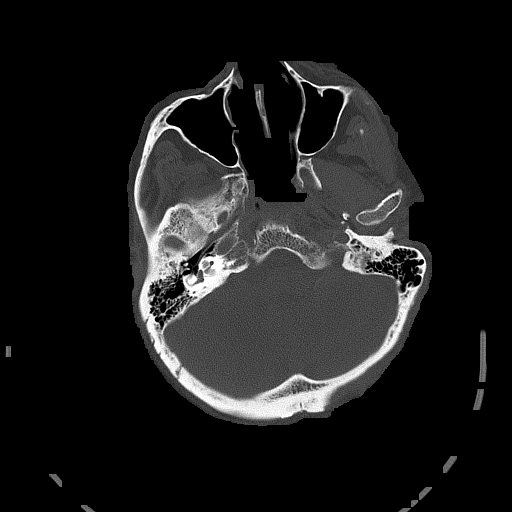

[Series 5: head without cor · coronal · non-contrast · 0.32mm/px · 3 of 67 slices shown]
[im 23/67  brain]
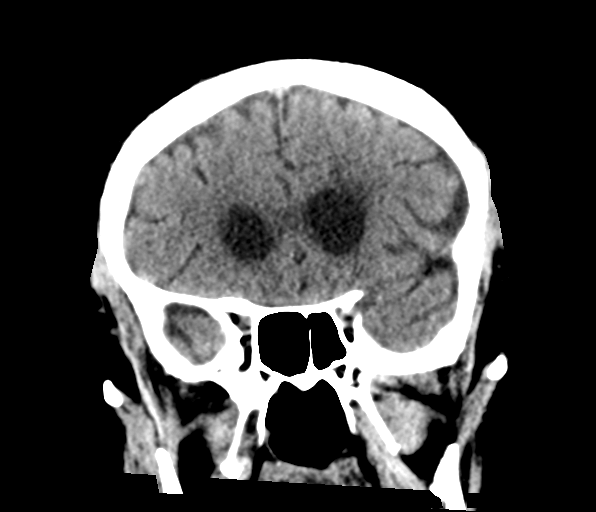
[im 30/67  brain]
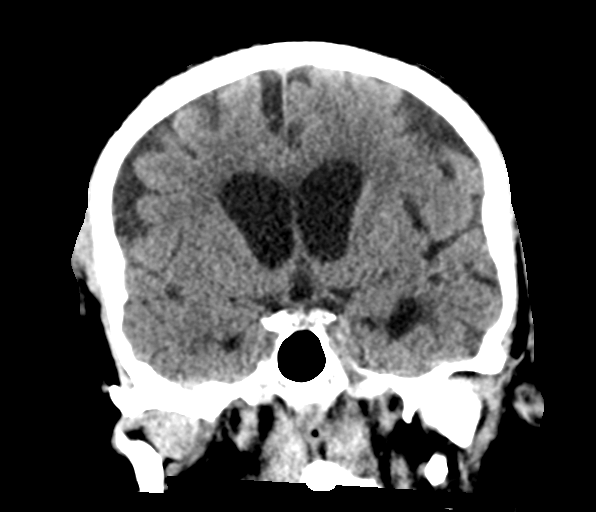
[im 37/67  brain]
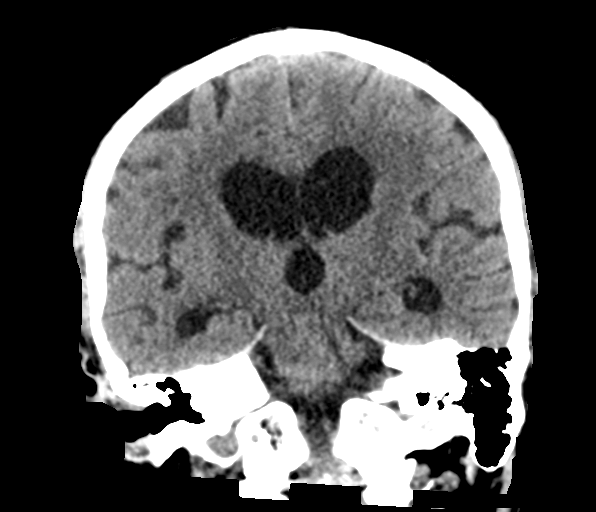

[Series 6: head without sag · sagittal · non-contrast · 0.37mm/px · 3 of 67 slices shown]
[im 24/67  brain]
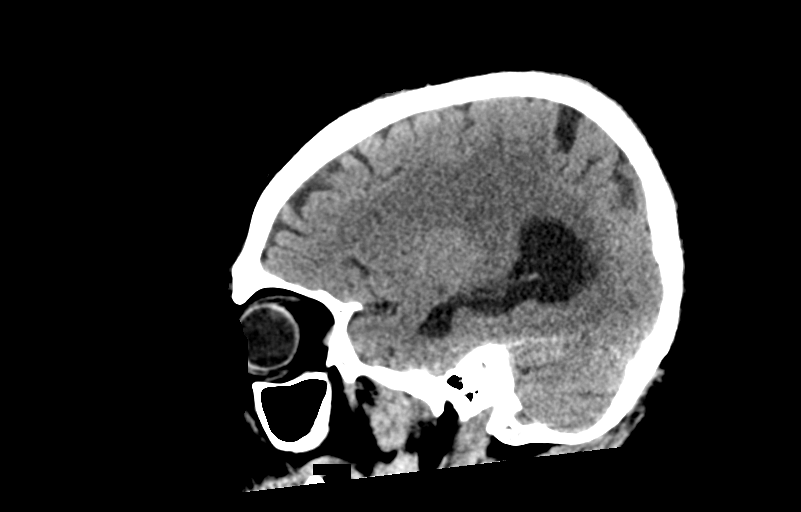
[im 34/67  brain]
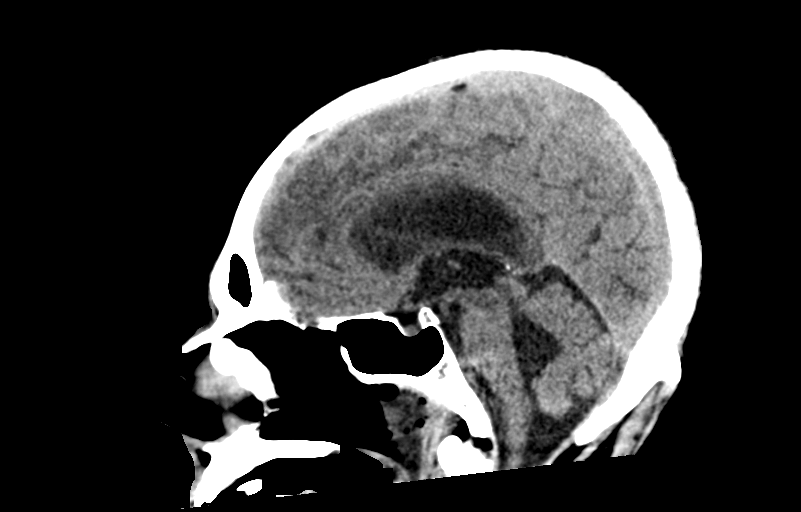
[im 44/67  brain]
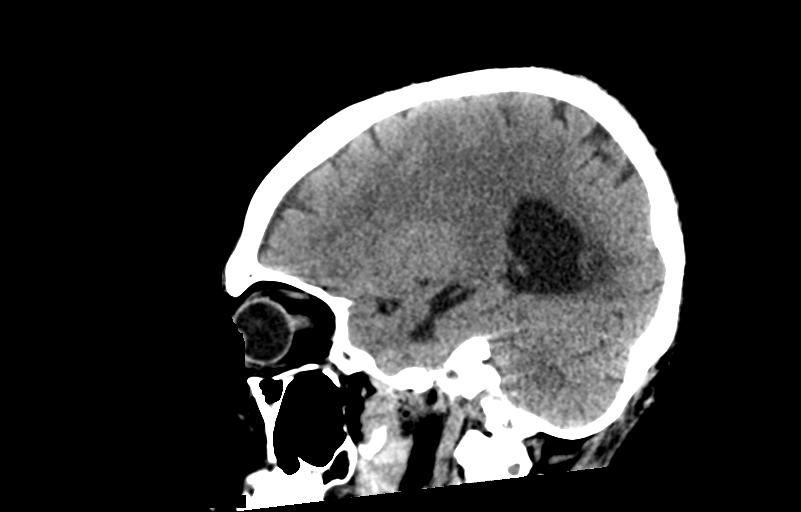

[15 of 47 positions shown; findings below may reference images not displayed]

FINDINGS: Brain: Compared to the priors head CT scan, the ventricles are
dilated diffusely consistent with communicating hydrocephalus. Small
volume of debris layering dependently in the occipital horns lateral
ventricles is better visualized on the prior brain MRI. There is no
midline shift, hemorrhage, acute infarction or mass.

Vascular: No hyperdense vessel or unexpected calcification.

Skull: Normal.

Sinuses/Orbits: Negative.

Other: None.
IMPRESSION: Dilatation of the ventricular system compared to the 0020 CT scan
consistent with communicating hydrocephalus.

Debris layering within the lateral ventricles is better seen on
comparison MRI.

## 2018-10-04 IMAGING — DX DG CHEST 1V PORT
1 series · 1 of 1 positions shown · non-contrast
Comparison: Chest x-ray 11/24/2017.

CLINICAL DATA: 61-year-old male status post intubation.

EXAM:
PORTABLE CHEST 1 VIEW

[chest]
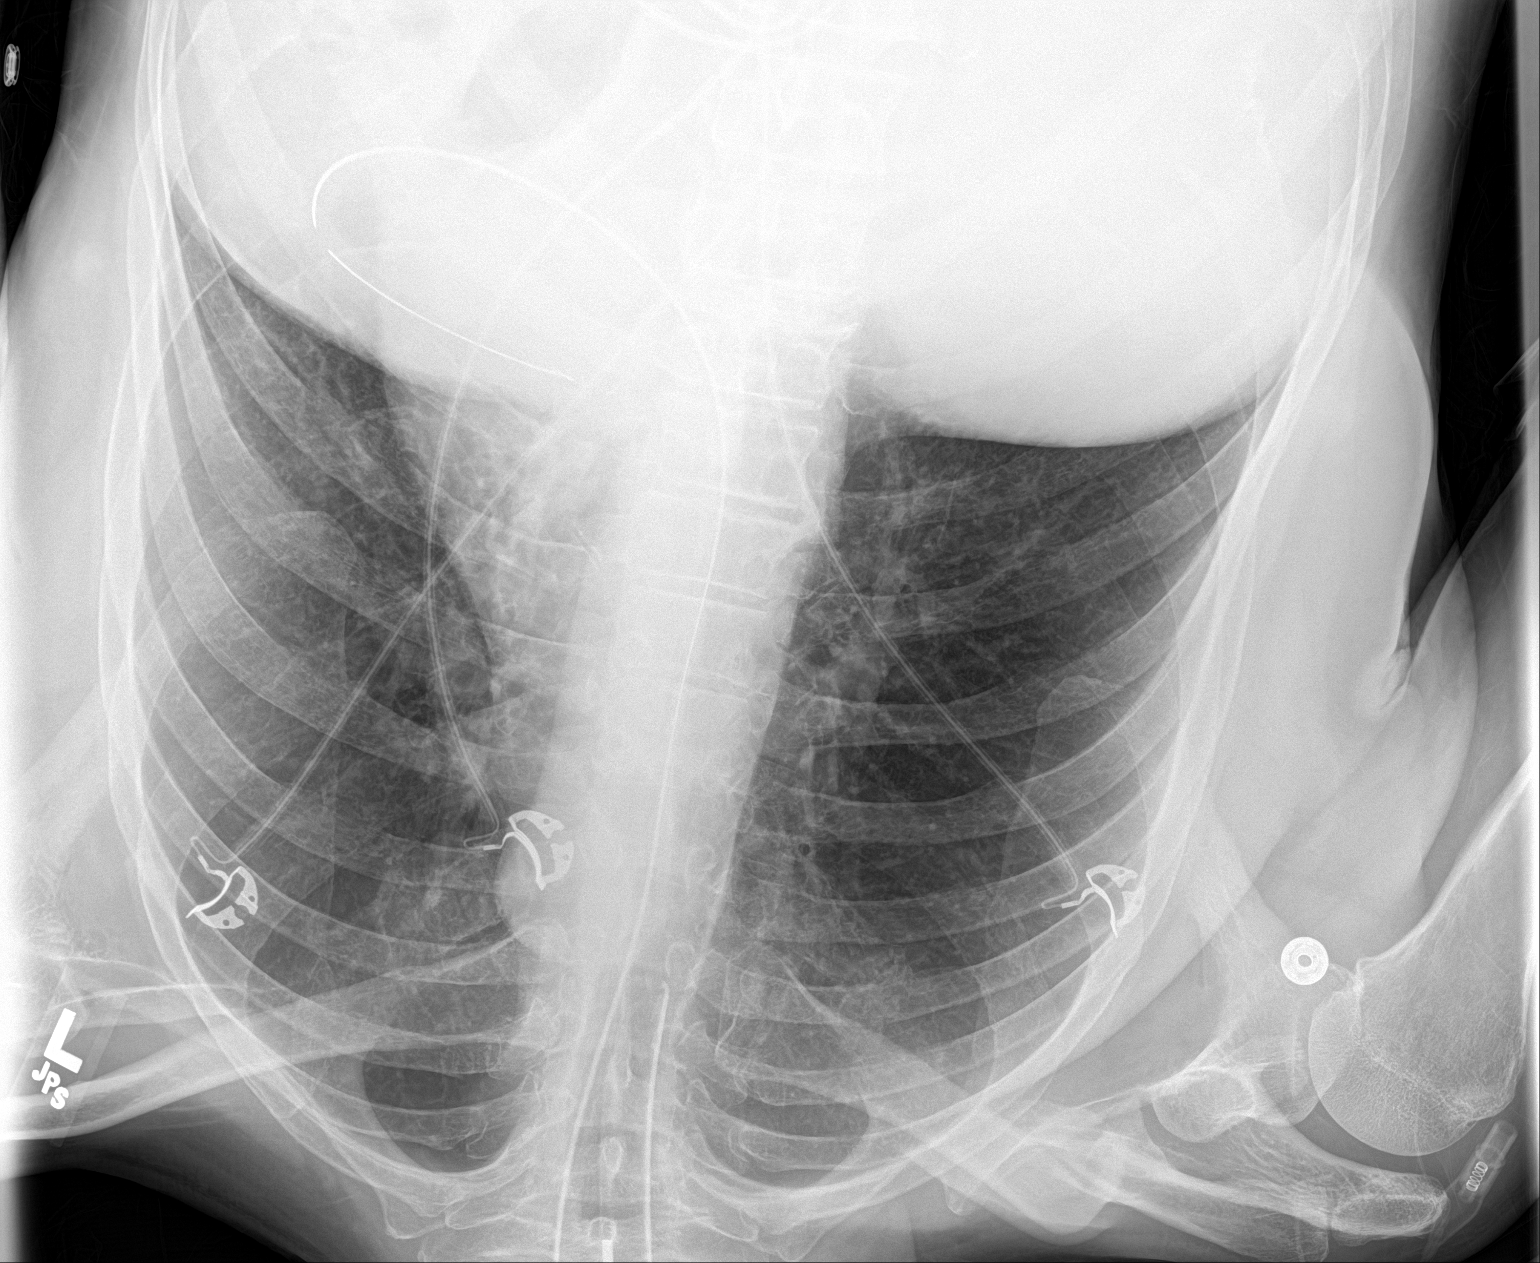

[1 of 1 positions shown; findings below may reference images not displayed]

FINDINGS: An endotracheal tube is in place with tip 5.0 cm above the carina.
Nasogastric tube in place with tip curled back into the proximal
stomach adjacent to the gastroesophageal junction. Lung volumes are
slightly low. Consolidation and atelectasis in the medial aspect of
the left lower lobe. Right lung appears clear. No pleural effusions.
No evidence of pulmonary edema. No pneumothorax. Heart size is
normal. Upper mediastinal contours are within normal limits.
IMPRESSION: 1. Support apparatus, as above.
2. Airspace consolidation and volume loss in the medial left lower
lobe concerning for pneumonia. Followup PA and lateral chest X-ray
is recommended in 3-4 weeks following trial of antibiotic therapy to
ensure resolution and exclude underlying malignancy.
